# Patient Record
Sex: Female | Born: 1945
Health system: Southern US, Community
[De-identification: ages and names within clinical notes are randomized; demographics above are authoritative.]

## PROBLEM LIST (undated history)

## (undated) DIAGNOSIS — M1990 Unspecified osteoarthritis, unspecified site: Secondary | ICD-10-CM

## (undated) DIAGNOSIS — L409 Psoriasis, unspecified: Secondary | ICD-10-CM

## (undated) DIAGNOSIS — M25571 Pain in right ankle and joints of right foot: Secondary | ICD-10-CM

## (undated) DIAGNOSIS — I7300 Raynaud's syndrome without gangrene: Secondary | ICD-10-CM

## (undated) DIAGNOSIS — R7303 Prediabetes: Secondary | ICD-10-CM

## (undated) DIAGNOSIS — E039 Hypothyroidism, unspecified: Secondary | ICD-10-CM

## (undated) DIAGNOSIS — I10 Essential (primary) hypertension: Secondary | ICD-10-CM

## (undated) DIAGNOSIS — L400 Psoriasis vulgaris: Secondary | ICD-10-CM

## (undated) DIAGNOSIS — G8929 Other chronic pain: Secondary | ICD-10-CM

## (undated) HISTORY — PX: BREAST BIOPSY: SHX20

## (undated) HISTORY — DX: Pain in right ankle and joints of right foot: M25.571

## (undated) HISTORY — DX: Hypothyroidism, unspecified: E03.9

## (undated) HISTORY — PX: OTHER SURGICAL HISTORY: SHX169

## (undated) HISTORY — DX: Other chronic pain: G89.29

---

## 2009-09-03 ENCOUNTER — Encounter: Payer: Self-pay | Admitting: Cardiovascular Disease

## 2009-09-04 ENCOUNTER — Ambulatory Visit: Payer: Self-pay | Admitting: Cardiovascular Disease

## 2009-09-04 DIAGNOSIS — I491 Atrial premature depolarization: Secondary | ICD-10-CM | POA: Insufficient documentation

## 2009-09-04 DIAGNOSIS — I498 Other specified cardiac arrhythmias: Secondary | ICD-10-CM | POA: Insufficient documentation

## 2009-11-27 ENCOUNTER — Ambulatory Visit: Payer: Self-pay | Admitting: Internal Medicine

## 2010-03-20 ENCOUNTER — Ambulatory Visit: Payer: Self-pay | Admitting: Internal Medicine

## 2010-04-21 NOTE — Assessment & Plan Note (Signed)
Summary: NP6/AMD   Visit Type:  Initial Consult Primary Provider:  Dr Ronna Polio  CC:  slightly dizzy -- arrythmia.  History of Present Illness: Alexandra Bishop is a pleasant 65 year old woman with no significant cardiac history, who is retired, patient of Dr. Ronna Polio, who presents by referral for evaluation of arrhythmia and bradycardia.  Alexandra Bishop states that overall she feels well. She tries to walk 1 mile per day early in the morning before takeoff. She has no chest pain, no shortness of breath or lower extremity edema either at rest or with exertion. She would like to walk more but she likes to read books and watch TV. She is to watch her diet in an effort to loose weight. She does report significant stressors in that they're having difficulty selling their [place]. She is otherwise happy and has no complaints.  She's not appreciate any tachypalpitations. She does not typically have any dizziness or lightheadedness the reports having a very brief episode this morning though this is not common.  EKG shows normal sinus rhythm with frequent APCs, rate of 83 beats per minute some mild T-wave abnormalities noted in the anterolateral leads, V3 to V6,  as well as lead II  Preventive Screening-Counseling & Management  Alcohol-Tobacco     Smoking Status: never  Caffeine-Diet-Exercise     Does Patient Exercise: yes      Drug Use:  no.    Current Medications (verified): 1)  Levothyroxine Sodium 50 Mcg Tabs (Levothyroxine Sodium) .... Once Daily 2)  Doxycycline Hyclate 50 Mg Caps (Doxycycline Hyclate) .... Once Daily 3)  Naprosyn 500 Mg Tabs (Naproxen) .... Once Daily 4)  Metaxalone 800 Mg Tabs (Metaxalone) .... At Bedtime 5)  Aspirin 81 Mg Tbec (Aspirin) .... Take One Tablet By Mouth Daily 6)  Vitamin E 600 Unit  Caps (Vitamin E) .... 4 Times A Week 7)  Vitamin D 400 Unit  Tabs (Cholecalciferol) .... 2 Daily 8)  No Flush Niacin 250 Mg Tabs  (Inositol Niacinate) .... Once Daily 9)  Multivitamins   Tabs (Multiple Vitamin) .... Once Daily 10)  Zolpidem Tartrate 5 Mg Tabs (Zolpidem Tartrate) .... At Bedtime  Allergies (verified): 1)  ! * Shellfish  Past History:  Past Surgical History: Broke ankle -- RT  Family History: Family History of Cancer:  Family History of Thyroid Disease:   Social History: Retired  -- office work Married  Tobacco Use - No.  Alcohol Use - no Regular Exercise - yes -- walk a mile most days Drug Use - no Smoking Status:  never Does Patient Exercise:  yes Drug Use:  no  Review of Systems  The patient denies fever, weight loss, weight gain, vision loss, decreased hearing, hoarseness, chest pain, syncope, dyspnea on exertion, peripheral edema, prolonged cough, abdominal pain, incontinence, muscle weakness, depression, and enlarged lymph nodes.    Vital Signs:  Patient profile:   65 year old female Height:      67 inches Weight:      210 pounds BMI:     33.01 Pulse rate:   83 / minute BP sitting:   112 / 74  (right arm) Cuff size:   large  Vitals Entered By: Hardin Negus, RMA (September 04, 2009 12:16 PM)  Physical Exam  General:  Well developed, well nourished, in no acute distress. Head:  normocephalic and atraumatic Neck:  Neck supple, no JVD. No masses, thyromegaly or abnormal cervical nodes. Lungs:  Clear  bilaterally to auscultation and percussion. Heart:  Non-displaced PMI, chest non-tender; regular rate and rhythm, S1, S2 without murmurs, rubs or gallops. Carotid upstroke normal, no bruit. . Pedals normal pulses. No edema, no varicosities. Abdomen:  Bowel sounds positive; abdomen soft and non-tender without masses Msk:  Back normal, normal gait. Muscle strength and tone normal. Pulses:  pulses normal in all 4 extremities Extremities:  No clubbing or cyanosis. Neurologic:  Alert and oriented x 3. Skin:  Intact without lesions or rashes. Psych:  Normal affect.   Impression &  Recommendations:  Problem # 1:  SUPRAVENTRICULAR PREMATURE BEATS (ICD-427.61)  EKG shows normal sinus rhythm with frequent APCs. She is asymptomatic and does not appreciate any ectopy. Her clinical exam is benign, she has no symptoms with exertion. No symptoms of lightheadedness or dizziness.  We have suggested to her that we could watch her for now. If she becomes symptomatic with lightheadedness dizziness, or tachypalpitations, we could perform an echocardiogram and possibly even a Holter monitor to estimate the frequency of her ectopy. I suggested that she hold off on stimulants such as coffee and chocolate if she does become symptomatic.  If she is asymptomatic, no further workup is needed at this time. She does not have a strong family history of coronary artery disease in her family. She reports that her cholesterol is relatively well controlled without medications. She is nonsmoker, nondiabetic and has few risk factors for coronary disease.  Her updated medication list for this problem includes:    Aspirin 81 Mg Tbec (Aspirin) .Marland Kitchen... Take one tablet by mouth daily  Her updated medication list for this problem includes:    Aspirin 81 Mg Tbec (Aspirin) .Marland Kitchen... Take one tablet by mouth daily  Problem # 2:  BRADYCARDIA (ICD-427.89) no bradycardia on her examination today, heart rate is adequate. She is not on any medications that would contribute to bradycardia. I suspect he bradycardia may in fact be due to her underlying ectopy. She does have compensatory pauses after each ectopic beat.  Again we will continue to monitor her and if she has worsening symptoms of lightheadedness or dizziness, we would perform a Holter monitor.  Her updated medication list for this problem includes:    Aspirin 81 Mg Tbec (Aspirin) .Marland Kitchen... Take one tablet by mouth daily

## 2010-04-21 NOTE — Letter (Signed)
Summary: PHI  PHI   Imported By: Harlon Flor 09/05/2009 09:03:15  _____________________________________________________________________  External Attachment:    Type:   Image     Comment:   External Document

## 2010-08-18 ENCOUNTER — Ambulatory Visit: Payer: Self-pay | Admitting: Internal Medicine

## 2010-10-07 ENCOUNTER — Encounter: Payer: Self-pay | Admitting: Cardiovascular Disease

## 2010-10-29 ENCOUNTER — Encounter: Payer: Self-pay | Admitting: Internal Medicine

## 2010-11-02 ENCOUNTER — Encounter: Payer: Self-pay | Admitting: Internal Medicine

## 2010-11-12 ENCOUNTER — Other Ambulatory Visit: Payer: Self-pay | Admitting: *Deleted

## 2010-11-12 MED ORDER — MELOXICAM 7.5 MG PO TABS: 7.5000 mg | ORAL_TABLET | Freq: Every day | ORAL | Status: DC

## 2010-11-13 NOTE — Telephone Encounter (Signed)
Rx phoned to pharmacy.  

## 2010-11-17 ENCOUNTER — Ambulatory Visit: Payer: Self-pay | Admitting: Internal Medicine

## 2010-12-14 ENCOUNTER — Other Ambulatory Visit: Payer: Self-pay | Admitting: Internal Medicine

## 2010-12-14 MED ORDER — AMLODIPINE BESYLATE 5 MG PO TABS: 5.0000 mg | ORAL_TABLET | Freq: Every day | ORAL | Status: DC

## 2010-12-25 ENCOUNTER — Ambulatory Visit: Payer: Self-pay | Admitting: Internal Medicine

## 2011-01-04 ENCOUNTER — Ambulatory Visit (INDEPENDENT_AMBULATORY_CARE_PROVIDER_SITE_OTHER): Payer: Medicare Other | Admitting: Internal Medicine

## 2011-01-04 ENCOUNTER — Encounter: Payer: Self-pay | Admitting: Internal Medicine

## 2011-01-04 VITALS — BP 137/76 | HR 54 | Temp 98.2°F | Resp 16 | Ht 65.5 in | Wt 205.2 lb

## 2011-01-04 DIAGNOSIS — F411 Generalized anxiety disorder: Secondary | ICD-10-CM

## 2011-01-04 DIAGNOSIS — F419 Anxiety disorder, unspecified: Secondary | ICD-10-CM

## 2011-01-04 DIAGNOSIS — Z23 Encounter for immunization: Secondary | ICD-10-CM

## 2011-01-04 DIAGNOSIS — I10 Essential (primary) hypertension: Secondary | ICD-10-CM | POA: Insufficient documentation

## 2011-01-04 DIAGNOSIS — E559 Vitamin D deficiency, unspecified: Secondary | ICD-10-CM | POA: Insufficient documentation

## 2011-01-04 DIAGNOSIS — E785 Hyperlipidemia, unspecified: Secondary | ICD-10-CM

## 2011-01-04 DIAGNOSIS — Z0000 Encounter for general adult medical examination without abnormal findings: Secondary | ICD-10-CM

## 2011-01-04 DIAGNOSIS — E039 Hypothyroidism, unspecified: Secondary | ICD-10-CM

## 2011-01-04 NOTE — Progress Notes (Signed)
Subjective:    Patient ID: Alexandra Bishop, female    DOB: 03/30/1945, 65 y.o.   MRN: [id number]  HPI Alexandra Bishop is a 65 year old female who presents to followup hypertension, hypothyroidism. She reports recent increase in anxiety secondary [place] in moving closer to town but there is some difference of opinion between her and her husband. She notes feeling some anxiety and sadness about this. She is interested in learning techniques to help lower her anxiety. She is not interested in medication.  In regards to her hypothyroidism and hypertension she reports full compliance with her medications. She denies any complications with her medications.    Outpatient Encounter Prescriptions as of 01/04/2011  Medication Sig Dispense Refill  . amLODipine (NORVASC) 5 MG tablet Take 1 tablet (5 mg total) by mouth daily.  30 tablet  3  . Ascorbic Acid (VITAMIN C) 1000 MG tablet Take 1,000 mg by mouth daily.        Marland Kitchen aspirin 81 MG EC tablet Take 81 mg by mouth daily.        Marland Kitchen b complex vitamins tablet Take 1 tablet by mouth daily.        Marland Kitchen doxycycline (VIBRAMYCIN) 50 MG capsule Take 50 mg by mouth daily.        Marland Kitchen levothyroxine (SYNTHROID, LEVOTHROID) 50 MCG tablet Take 100 mcg by mouth daily.       . meloxicam (MOBIC) 7.5 MG tablet Take 1 tablet (7.5 mg total) by mouth daily.  30 tablet  3  . Multiple Vitamin (MULTIVITAMIN) tablet Take 1 tablet by mouth daily.        . vitamin D, CHOLECALCIFEROL, 400 UNITS tablet Take 1,000 Units by mouth daily.       . vitamin E 600 UNIT capsule Take 400 Units by mouth daily. 4 times a week      . zolpidem (AMBIEN) 5 MG tablet Take 5 mg by mouth at bedtime.          Review of Systems  Constitutional: Negative for fever, chills, appetite change, fatigue and unexpected weight change.  HENT: Negative for ear pain, congestion, sore throat, trouble swallowing, neck pain, voice change and sinus pressure.     Eyes: Negative for visual disturbance.  Respiratory: Negative for cough, shortness of breath, wheezing and stridor.   Cardiovascular: Negative for chest pain, palpitations and leg swelling.  Gastrointestinal: Negative for nausea, vomiting, abdominal pain, diarrhea, constipation, blood in stool, abdominal distention and anal bleeding.  Genitourinary: Negative for dysuria and flank pain.  Musculoskeletal: Positive for back pain. Negative for myalgias, arthralgias and gait problem.  Skin: Negative for color change and rash.  Neurological: Negative for dizziness and headaches.  Hematological: Negative for adenopathy. Does not bruise/bleed easily.  Psychiatric/Behavioral: Positive for dysphoric mood. Negative for suicidal ideas and sleep disturbance. The patient is nervous/anxious.    BP 137/76  Pulse 54  Temp(Src) 98.2 F (36.8 C) (Oral)  Resp 16  Ht 5' 5.5" (1.664 m)  Wt 205 lb 4 oz (93.101 kg)  BMI 33.64 kg/m2  SpO2 97%     Objective:   Physical Exam  Constitutional: She is oriented to person, place, and time. She appears well-developed and well-nourished. No distress.  HENT:  Head: Normocephalic and atraumatic.  Right Ear: External ear normal.  Left Ear: External ear normal.  Nose: Nose normal.  Mouth/Throat: Oropharynx is clear and moist. No oropharyngeal exudate.  Eyes: Conjunctivae are normal. Pupils  are equal, round, and reactive to light. Right eye exhibits no discharge. Left eye exhibits no discharge. No scleral icterus.  Neck: Normal range of motion. Neck supple. No tracheal deviation present. No thyromegaly present.  Cardiovascular: Normal rate, regular rhythm, normal heart sounds and intact distal pulses.  Exam reveals no gallop and no friction rub.   No murmur heard. Pulmonary/Chest: Effort normal and breath sounds normal. No respiratory distress. She has no wheezes. She has no rales. She exhibits no tenderness. Right breast exhibits no inverted nipple, no mass, no  nipple discharge, no skin change and no tenderness. Left breast exhibits no inverted nipple, no mass, no nipple discharge, no skin change and no tenderness. Breasts are symmetrical.  Abdominal: Soft. Bowel sounds are normal. She exhibits no distension and no mass. There is no tenderness. There is no rebound and no guarding.  Musculoskeletal: Normal range of motion. She exhibits no edema and no tenderness.  Lymphadenopathy:    She has no cervical adenopathy.  Neurological: She is alert and oriented to person, place, and time. No cranial nerve deficit. She exhibits normal muscle tone. Coordination normal.  Skin: Skin is warm and dry. No rash noted. She is not diaphoretic. No erythema. No pallor.  Psychiatric: Her behavior is normal. Judgment and thought content normal. Her mood appears anxious.          Assessment & Plan:  1. Anxiety -worsened in the setting of pending move. Offered support today. Gave resources including the book Feeling Good by Lawerance Bach and a relaxation tape by Tad Moore. She will call or return to clinic should symptoms not improve.  2. Hypertension -blood pressure well-controlled today. We will check renal function with labs fasting tomorrow. She will followup in 6 months.   3. Hypothyroidism - Will check TSH with labs tomorrow. She will continue Synthroid.

## 2011-01-05 ENCOUNTER — Other Ambulatory Visit (INDEPENDENT_AMBULATORY_CARE_PROVIDER_SITE_OTHER): Payer: Medicare Other | Admitting: *Deleted

## 2011-01-05 DIAGNOSIS — E785 Hyperlipidemia, unspecified: Secondary | ICD-10-CM

## 2011-01-05 DIAGNOSIS — I10 Essential (primary) hypertension: Secondary | ICD-10-CM

## 2011-01-05 DIAGNOSIS — E559 Vitamin D deficiency, unspecified: Secondary | ICD-10-CM

## 2011-01-05 DIAGNOSIS — E039 Hypothyroidism, unspecified: Secondary | ICD-10-CM

## 2011-01-05 DIAGNOSIS — Z0000 Encounter for general adult medical examination without abnormal findings: Secondary | ICD-10-CM

## 2011-01-05 LAB — COMPREHENSIVE METABOLIC PANEL
ALT: 29 U/L (ref 0–35)
AST: 29 U/L (ref 0–37)
Albumin: 4.4 g/dL (ref 3.5–5.2)
Alkaline Phosphatase: 79 U/L (ref 39–117)
BUN: 17 mg/dL (ref 6–23)
CO2: 28 mEq/L (ref 19–32)
Calcium: 9.2 mg/dL (ref 8.4–10.5)
Chloride: 108 mEq/L (ref 96–112)
Creatinine, Ser: 0.7 mg/dL (ref 0.4–1.2)
GFR: 91.62 mL/min (ref >60.00)
Glucose, Bld: 95 mg/dL (ref 70–99)
Potassium: 4.4 mEq/L (ref 3.5–5.1)
Sodium: 142 mEq/L (ref 135–145)
Total Bilirubin: 1 mg/dL (ref 0.3–1.2)
Total Protein: 6.5 g/dL (ref 6.0–8.3)

## 2011-01-05 LAB — CBC WITH DIFFERENTIAL/PLATELET
Basophils Absolute: 0 10*3/uL (ref 0.0–0.1)
Basophils Relative: 0.6 % (ref 0.0–3.0)
Eosinophils Absolute: 0.2 10*3/uL (ref 0.0–0.7)
Eosinophils Relative: 3.1 % (ref 0.0–5.0)
HCT: 46 % (ref 36.0–46.0)
Hemoglobin: 15.3 g/dL — ABNORMAL HIGH (ref 12.0–15.0)
Lymphocytes Relative: 20.6 % (ref 12.0–46.0)
Lymphs Abs: 1.2 10*3/uL (ref 0.7–4.0)
MCHC: 33.3 g/dL (ref 30.0–36.0)
MCV: 86.3 fl (ref 78.0–100.0)
Monocytes Absolute: 0.5 10*3/uL (ref 0.1–1.0)
Monocytes Relative: 8.4 % (ref 3.0–12.0)
Neutro Abs: 3.8 10*3/uL (ref 1.4–7.7)
Neutrophils Relative %: 67.3 % (ref 43.0–77.0)
Platelets: 227 10*3/uL (ref 150.0–400.0)
RBC: 5.33 Mil/uL — ABNORMAL HIGH (ref 3.87–5.11)
RDW: 12.9 % (ref 11.5–14.6)
WBC: 5.7 10*3/uL (ref 4.5–10.5)

## 2011-01-05 LAB — LIPID PANEL
Cholesterol: 174 mg/dL (ref 0–200)
HDL: 49.1 mg/dL (ref >39.00)
LDL Cholesterol: 97 mg/dL (ref 0–99)
Total CHOL/HDL Ratio: 4
Triglycerides: 140 mg/dL (ref 0.0–149.0)
VLDL: 28 mg/dL (ref 0.0–40.0)

## 2011-01-05 LAB — TSH: TSH: 0.71 u[IU]/mL (ref 0.35–5.50)

## 2011-01-06 LAB — VITAMIN D 25 HYDROXY (VIT D DEFICIENCY, FRACTURES): Vit D, 25-Hydroxy: 56 ng/mL (ref 30–89)

## 2011-01-11 ENCOUNTER — Telehealth: Payer: Self-pay | Admitting: Internal Medicine

## 2011-01-11 NOTE — Telephone Encounter (Signed)
Need you to call dr Markham Jordan @ [facility]  To make her referral she cannot make this herself

## 2011-01-13 ENCOUNTER — Encounter: Payer: Self-pay | Admitting: Internal Medicine

## 2011-01-22 ENCOUNTER — Other Ambulatory Visit: Payer: Self-pay | Admitting: Internal Medicine

## 2011-01-22 MED ORDER — ZOLPIDEM TARTRATE 5 MG PO TABS: 5.0000 mg | ORAL_TABLET | Freq: Every day | ORAL | Status: DC

## 2011-01-23 NOTE — Telephone Encounter (Signed)
Faxed to pharm 

## 2011-01-24 LAB — HM COLONOSCOPY: HM Colonoscopy: NORMAL

## 2011-03-12 ENCOUNTER — Other Ambulatory Visit: Payer: Self-pay | Admitting: *Deleted

## 2011-03-12 MED ORDER — MELOXICAM 7.5 MG PO TABS: 7.5000 mg | ORAL_TABLET | Freq: Every day | ORAL | Status: DC

## 2011-04-14 ENCOUNTER — Other Ambulatory Visit: Payer: Self-pay | Admitting: *Deleted

## 2011-04-14 ENCOUNTER — Ambulatory Visit (INDEPENDENT_AMBULATORY_CARE_PROVIDER_SITE_OTHER): Payer: Medicare Other | Admitting: Internal Medicine

## 2011-04-14 ENCOUNTER — Encounter: Payer: Self-pay | Admitting: Internal Medicine

## 2011-04-14 VITALS — BP 142/90 | HR 63 | Temp 97.8°F | Ht 67.0 in | Wt 208.0 lb

## 2011-04-14 DIAGNOSIS — R82998 Other abnormal findings in urine: Secondary | ICD-10-CM

## 2011-04-14 DIAGNOSIS — M79609 Pain in unspecified limb: Secondary | ICD-10-CM

## 2011-04-14 DIAGNOSIS — R8290 Unspecified abnormal findings in urine: Secondary | ICD-10-CM | POA: Insufficient documentation

## 2011-04-14 DIAGNOSIS — I10 Essential (primary) hypertension: Secondary | ICD-10-CM

## 2011-04-14 DIAGNOSIS — E669 Obesity, unspecified: Secondary | ICD-10-CM | POA: Insufficient documentation

## 2011-04-14 DIAGNOSIS — M79601 Pain in right arm: Secondary | ICD-10-CM | POA: Insufficient documentation

## 2011-04-14 LAB — POCT URINALYSIS DIPSTICK
Bilirubin, UA: NEGATIVE
Blood, UA: NEGATIVE
Glucose, UA: NEGATIVE
Ketones, UA: NEGATIVE
Leukocytes, UA: NEGATIVE
Nitrite, UA: NEGATIVE
Protein, UA: NEGATIVE
Spec Grav, UA: 1.025
Urobilinogen, UA: 0.2
pH, UA: 5.5

## 2011-04-14 MED ORDER — AMLODIPINE BESYLATE 5 MG PO TABS: 5.0000 mg | ORAL_TABLET | Freq: Every day | ORAL | Status: DC

## 2011-04-14 NOTE — Assessment & Plan Note (Signed)
Blood pressure is elevated today. Patient would like to try dietary means to help improve blood pressure control. Gave her information on the DASH diet. Patient will followup in one month.

## 2011-04-14 NOTE — Assessment & Plan Note (Signed)
Patient is obese with BMI of 32. She has adopted a lower calorie diet and I encouraged her to continue with this. However, she has limited intake of some healthy foods such as spinach. Encouraged her to increase intake of fruits and vegetables. We will set her up with a nutritionist. Encouraged moderate exercise with a goal of 30-60 minutes most days of the week. She will followup in one month.

## 2011-04-14 NOTE — Assessment & Plan Note (Signed)
Suspect secondary to overuse. Exam is normal. Patient will call or return to clinic if symptoms persist.

## 2011-04-14 NOTE — Patient Instructions (Signed)
DASH diet - Diet to lower blood pressure

## 2011-04-14 NOTE — Assessment & Plan Note (Signed)
Suspect secondary to use of multivitamin including vitamin C. Symptoms resolved with discontinuing this medication. Urinalysis is normal today and patient does not have symptoms or physical exam findings to suggest urinary tract infection. Will, however send urine for culture. Patient will followup as needed.

## 2011-04-14 NOTE — Progress Notes (Signed)
Subjective:    Patient ID: Alexandra Bishop, female    DOB: February 07, 1946, 66 y.o.   MRN: [id number]  HPI 66 year old female with history of hypertension, hypothyroidism, and obesity presents for acute visit complaining of foul smell to her urine. She first noticed this several weeks ago. This is in the setting of using the vitamin supplements including vitamin C. She stopped taking the medication and the foul-smelling improved. She denies any dysuria, frequency, hematuria, flank pain, fever or chills.  She is also concerned today about recent elevated blood pressure. She notes that when she was evaluated for her colonoscopy her blood pressure was elevated to 180/100. She reports full compliance with her amlodipine. She is interested in making changes in diet and exercise to help lower blood pressure. Next  She is also concerned about her weight. She notes that she has adopted a diet which is low in calories with a goal of 300 calories per meal. She has been limiting intake of foods that are high in fiber because of her recommendation she read in a magazine that foods high in fiber can exacerbate thyroid dysfunction. She does not currently participate in an exercise program.  She is also concerned today about right arm pain. She notes that she has been very active doing gardening crafts and questions if this may be a strain in her muscle. The pain is located in her right upper arm. She has no pain in her joint. She has no weakness in her arm. She has no numbness in her arm or hands.  Outpatient Encounter Prescriptions as of 04/14/2011  Medication Sig Dispense Refill  . amLODipine (NORVASC) 5 MG tablet Take 1 tablet (5 mg total) by mouth daily.  30 tablet  3  . aspirin 81 MG EC tablet Take 81 mg by mouth daily.        Marland Kitchen b complex vitamins tablet Take 1 tablet by mouth daily.        Marland Kitchen doxycycline (VIBRAMYCIN) 50 MG capsule Take 50 mg by mouth daily.        Marland Kitchen levothyroxine (SYNTHROID, LEVOTHROID) 50 MCG  tablet Take 100 mcg by mouth daily.       . meloxicam (MOBIC) 7.5 MG tablet Take 1 tablet (7.5 mg total) by mouth daily.  90 tablet  1  . Multiple Vitamin (MULTIVITAMIN) tablet Take 1 tablet by mouth daily.        . vitamin D, CHOLECALCIFEROL, 400 UNITS tablet Take 1,000 Units by mouth daily.       . vitamin E 600 UNIT capsule Take 400 Units by mouth daily. 4 times a week      . zolpidem (AMBIEN) 5 MG tablet Take 1 tablet (5 mg total) by mouth at bedtime.  30 tablet  3    Review of Systems  Constitutional: Negative for fever, chills, appetite change, fatigue and unexpected weight change.  HENT: Negative for ear pain, congestion, sore throat, trouble swallowing, neck pain, voice change and sinus pressure.   Eyes: Negative for visual disturbance.  Respiratory: Negative for cough, shortness of breath, wheezing and stridor.   Cardiovascular: Negative for chest pain, palpitations and leg swelling.  Gastrointestinal: Negative for nausea, vomiting, abdominal pain, diarrhea, constipation, blood in stool, abdominal distention and anal bleeding.  Genitourinary: Negative for dysuria, urgency, frequency, hematuria, flank pain, decreased urine volume, difficulty urinating and pelvic pain.  Musculoskeletal: Positive for myalgias. Negative for arthralgias and gait problem.  Skin: Negative for color change and rash.  Neurological: Negative for  dizziness and headaches.  Hematological: Negative for adenopathy. Does not bruise/bleed easily.  Psychiatric/Behavioral: Negative for suicidal ideas, sleep disturbance and dysphoric mood. The patient is not nervous/anxious.    BP 142/90  Pulse 63  Temp(Src) 97.8 F (36.6 C) (Oral)  Ht 5\' 7"  (1.702 m)  Wt 208 lb (94.348 kg)  BMI 32.58 kg/m2  SpO2 98%     Objective:   Physical Exam  Constitutional: She is oriented to person, place, and time. She appears well-developed and well-nourished. No distress.  HENT:  Head: Normocephalic and atraumatic.  Right Ear:  External ear normal.  Left Ear: External ear normal.  Nose: Nose normal.  Mouth/Throat: Oropharynx is clear and moist. No oropharyngeal exudate.  Eyes: Conjunctivae are normal. Pupils are equal, round, and reactive to light. Right eye exhibits no discharge. Left eye exhibits no discharge. No scleral icterus.  Neck: Normal range of motion. Neck supple. No tracheal deviation present. No thyromegaly present.  Cardiovascular: Normal rate, regular rhythm, normal heart sounds and intact distal pulses.  Exam reveals no gallop and no friction rub.   No murmur heard. Pulmonary/Chest: Effort normal and breath sounds normal. No respiratory distress. She has no wheezes. She has no rales. She exhibits no tenderness.  Musculoskeletal: Normal range of motion. She exhibits no edema and no tenderness.  Lymphadenopathy:    She has no cervical adenopathy.  Neurological: She is alert and oriented to person, place, and time. No cranial nerve deficit. She exhibits normal muscle tone. Coordination normal.  Skin: Skin is warm and dry. No rash noted. She is not diaphoretic. No erythema. No pallor.  Psychiatric: She has a normal mood and affect. Her behavior is normal. Judgment and thought content normal.          Assessment & Plan:

## 2011-04-17 LAB — URINE CULTURE
Colony Count: NO GROWTH
Organism ID, Bacteria: NO GROWTH

## 2011-04-21 ENCOUNTER — Telehealth: Payer: Self-pay | Admitting: Internal Medicine

## 2011-04-21 NOTE — Telephone Encounter (Signed)
Patient wants her lab results.

## 2011-04-21 NOTE — Telephone Encounter (Signed)
Only labs I see are from 1/23, all are normal correct?

## 2011-04-21 NOTE — Telephone Encounter (Signed)
All I see is a urine culture which was negative from 1/23.

## 2011-04-22 NOTE — Telephone Encounter (Signed)
Patient returned call and was notified.

## 2011-04-22 NOTE — Telephone Encounter (Signed)
Pt returned your call .  Patient stated you can leave message

## 2011-04-22 NOTE — Telephone Encounter (Signed)
Left message asking patient to return my call.

## 2011-04-26 LAB — HM COLONOSCOPY: HM Colonoscopy: NORMAL

## 2011-04-30 ENCOUNTER — Telehealth: Payer: Self-pay | Admitting: Internal Medicine

## 2011-04-30 NOTE — Telephone Encounter (Signed)
NURSE PRACTICIONNER at GI office sent note over that pt BP elevated. Recommended STOPPING meloxicam.  Please tell pt to stop meloxicam.  She will need to follow up in 1 week at our office because of elevated BP.

## 2011-05-03 NOTE — Telephone Encounter (Signed)
I spoke w/pt - she states that she felt the automatic BP cuff was malfunctioning. They checked her BP manually in forearm at end of visit and pt says it was normal. I advised her to hold meloxicam and keep f/u apt this Thursday.

## 2011-05-06 ENCOUNTER — Telehealth: Payer: Self-pay | Admitting: *Deleted

## 2011-05-06 ENCOUNTER — Ambulatory Visit: Payer: Medicare Other | Admitting: Internal Medicine

## 2011-05-06 NOTE — Telephone Encounter (Signed)
Pt had car trouble this am and had to cx apt. She was supposed to f/u on her BP (see previous phone message). She has colon scheduled for Tuesday and she set up apt w/us for Wed. I discussed w/pt and rescheduled her for Monday AM.

## 2011-05-10 ENCOUNTER — Encounter: Payer: Self-pay | Admitting: Internal Medicine

## 2011-05-10 ENCOUNTER — Ambulatory Visit (INDEPENDENT_AMBULATORY_CARE_PROVIDER_SITE_OTHER): Payer: Medicare Other | Admitting: Internal Medicine

## 2011-05-10 ENCOUNTER — Other Ambulatory Visit: Payer: Self-pay | Admitting: *Deleted

## 2011-05-10 VITALS — BP 142/82 | HR 64 | Temp 98.4°F | Ht 67.0 in | Wt 205.0 lb

## 2011-05-10 DIAGNOSIS — M2550 Pain in unspecified joint: Secondary | ICD-10-CM

## 2011-05-10 DIAGNOSIS — I10 Essential (primary) hypertension: Secondary | ICD-10-CM

## 2011-05-10 DIAGNOSIS — M79601 Pain in right arm: Secondary | ICD-10-CM

## 2011-05-10 DIAGNOSIS — M79609 Pain in unspecified limb: Secondary | ICD-10-CM

## 2011-05-10 MED ORDER — DOXYCYCLINE HYCLATE 50 MG PO CAPS: 50.0000 mg | ORAL_CAPSULE | Freq: Every day | ORAL | Status: DC

## 2011-05-10 MED ORDER — AMLODIPINE BESYLATE 10 MG PO TABS: 10.0000 mg | ORAL_TABLET | Freq: Every day | ORAL | Status: DC

## 2011-05-10 MED ORDER — TRAMADOL HCL 50 MG PO TABS: 50.0000 mg | ORAL_TABLET | Freq: Three times a day (TID) | ORAL | Status: AC | PRN

## 2011-05-10 NOTE — Patient Instructions (Signed)
Increase Amlodipine to 10mg  daily.  Try using Tramadol 50mg  every 8hr as needed for pain.  Follow up 1 month.

## 2011-05-10 NOTE — Assessment & Plan Note (Signed)
Blood pressure is slightly elevated. Will increase dose of amlodipine to 10 mg daily. Patient will followup in one month.

## 2011-05-10 NOTE — Assessment & Plan Note (Signed)
Symptoms most consistent with biceps tendinitis. Patient reports minimal improvement with meloxicam. We'll add tramadol to see if any improvement. If no improvement, would favor getting imaging of her right shoulder and upper arm, in setting up referral to orthopedics.

## 2011-05-10 NOTE — Progress Notes (Signed)
Subjective:    Patient ID: Alexandra Bishop, female    DOB: 10/09/1945, 66 y.o.   MRN: [id number]  HPI 66 year old female with history of hypertension, and recent episodes of right arm pain presents for followup. Based on notes from her other physicians, her blood pressure has been elevated. She was started on amlodipine 5 mg daily with some improvement. She has not been regularly checking her blood pressure at home. She denies any headache, palpitations, chest pain, or shortness of breath. She reports compliance with her amlodipine.  In regards to her right arm pain, she notes no improvement with the use of meloxicam. She continues to have right arm pain anteriorly just below her shoulder. She notes that this is worsened by some of her activities such as crocheting. She does not notice weakness in her right arm, swelling, numbness or other complaints.  Outpatient Encounter Prescriptions as of 05/10/2011  Medication Sig Dispense Refill  . amLODipine (NORVASC) 10 MG tablet Take 1 tablet (10 mg total) by mouth daily.  90 tablet  3  . aspirin 81 MG EC tablet Take 81 mg by mouth daily.        Marland Kitchen levothyroxine (SYNTHROID, LEVOTHROID) 50 MCG tablet Take 100 mcg by mouth daily.       Marland Kitchen zolpidem (AMBIEN) 5 MG tablet Take 1 tablet (5 mg total) by mouth at bedtime.  30 tablet  3  . DISCONTD: amLODipine (NORVASC) 5 MG tablet Take 1 tablet (5 mg total) by mouth daily.  90 tablet  0  . b complex vitamins tablet Take 1 tablet by mouth daily.        . Multiple Vitamin (MULTIVITAMIN) tablet Take 1 tablet by mouth daily.        . traMADol (ULTRAM) 50 MG tablet Take 1 tablet (50 mg total) by mouth every 8 (eight) hours as needed for pain.  90 tablet  3  . vitamin D, CHOLECALCIFEROL, 400 UNITS tablet Take 1,000 Units by mouth daily.       . vitamin E 600 UNIT capsule Take 400 Units by mouth daily. 4 times a week      . DISCONTD: doxycycline (VIBRAMYCIN) 50 MG capsule Take 50 mg by mouth daily.        Marland Kitchen DISCONTD:  meloxicam (MOBIC) 7.5 MG tablet Take 1 tablet (7.5 mg total) by mouth daily.  90 tablet  1    Review of Systems  Constitutional: Negative for fever, chills, appetite change, fatigue and unexpected weight change.  HENT: Negative for ear pain, congestion, sore throat, trouble swallowing, neck pain, voice change and sinus pressure.   Eyes: Negative for visual disturbance.  Respiratory: Negative for cough, shortness of breath, wheezing and stridor.   Cardiovascular: Negative for chest pain, palpitations and leg swelling.  Gastrointestinal: Negative for nausea, vomiting, abdominal pain, diarrhea, constipation, blood in stool, abdominal distention and anal bleeding.  Genitourinary: Negative for dysuria and flank pain.  Musculoskeletal: Positive for myalgias and arthralgias. Negative for gait problem.  Skin: Negative for color change and rash.  Neurological: Negative for dizziness and headaches.  Hematological: Negative for adenopathy. Does not bruise/bleed easily.  Psychiatric/Behavioral: Negative for suicidal ideas, sleep disturbance and dysphoric mood. The patient is not nervous/anxious.    BP 142/82  Pulse 64  Temp(Src) 98.4 F (36.9 C) (Oral)  Ht 5\' 7"  (1.702 m)  Wt 205 lb (92.987 kg)  BMI 32.11 kg/m2  SpO2 100%     Objective:   Physical Exam  Constitutional: She is oriented  to person, place, and time. She appears well-developed and well-nourished. No distress.  HENT:  Head: Normocephalic and atraumatic.  Right Ear: External ear normal.  Left Ear: External ear normal.  Nose: Nose normal.  Mouth/Throat: Oropharynx is clear and moist. No oropharyngeal exudate.  Eyes: Conjunctivae are normal. Pupils are equal, round, and reactive to light. Right eye exhibits no discharge. Left eye exhibits no discharge. No scleral icterus.  Neck: Normal range of motion. Neck supple. No tracheal deviation present. No thyromegaly present.  Cardiovascular: Normal rate, regular rhythm, normal heart  sounds and intact distal pulses.  Exam reveals no gallop and no friction rub.   No murmur heard. Pulmonary/Chest: Effort normal and breath sounds normal. No respiratory distress. She has no wheezes. She has no rales. She exhibits no tenderness.  Musculoskeletal: Normal range of motion. She exhibits no edema and no tenderness.       Right shoulder: She exhibits tenderness.       Arms: Lymphadenopathy:    She has no cervical adenopathy.  Neurological: She is alert and oriented to person, place, and time. No cranial nerve deficit. She exhibits normal muscle tone. Coordination normal.  Skin: Skin is warm and dry. No rash noted. She is not diaphoretic. No erythema. No pallor.  Psychiatric: She has a normal mood and affect. Her behavior is normal. Judgment and thought content normal.          Assessment & Plan:

## 2011-05-11 ENCOUNTER — Ambulatory Visit: Payer: Self-pay | Admitting: Unknown Physician Specialty

## 2011-05-12 ENCOUNTER — Telehealth: Payer: Self-pay | Admitting: *Deleted

## 2011-05-12 ENCOUNTER — Ambulatory Visit: Payer: Medicare Other | Admitting: Internal Medicine

## 2011-05-12 LAB — PATHOLOGY REPORT

## 2011-05-12 NOTE — Telephone Encounter (Signed)
Patient requesting a call to discuss her medications.

## 2011-05-13 NOTE — Telephone Encounter (Signed)
Spoke w/pt - allergy list updated.

## 2011-05-18 ENCOUNTER — Ambulatory Visit: Payer: Medicare Other | Admitting: Internal Medicine

## 2011-05-19 ENCOUNTER — Ambulatory Visit: Payer: Self-pay | Admitting: Internal Medicine

## 2011-05-21 ENCOUNTER — Ambulatory Visit: Payer: Self-pay | Admitting: Internal Medicine

## 2011-05-25 ENCOUNTER — Ambulatory Visit: Payer: Medicare Other | Admitting: Internal Medicine

## 2011-05-31 ENCOUNTER — Encounter: Payer: Self-pay | Admitting: Internal Medicine

## 2011-06-04 ENCOUNTER — Ambulatory Visit (INDEPENDENT_AMBULATORY_CARE_PROVIDER_SITE_OTHER): Payer: Medicare Other | Admitting: Internal Medicine

## 2011-06-04 ENCOUNTER — Encounter: Payer: Self-pay | Admitting: Internal Medicine

## 2011-06-04 VITALS — BP 130/72 | HR 87 | Temp 98.0°F | Ht 67.0 in | Wt 197.0 lb

## 2011-06-04 DIAGNOSIS — R230 Cyanosis: Secondary | ICD-10-CM | POA: Insufficient documentation

## 2011-06-04 DIAGNOSIS — M79621 Pain in right upper arm: Secondary | ICD-10-CM

## 2011-06-04 DIAGNOSIS — M79609 Pain in unspecified limb: Secondary | ICD-10-CM

## 2011-06-04 DIAGNOSIS — I10 Essential (primary) hypertension: Secondary | ICD-10-CM

## 2011-06-04 NOTE — Progress Notes (Signed)
Subjective:    Patient ID: Alexandra Bishop, female    DOB: 1945-04-24, 66 y.o.   MRN: [id number]  HPI 66 year old female with history of hypertension, hypothyroidism, and chronic pain in her right upper arm presents for followup. In regards to her hypertension, she reports full compliance with her medications. She denies any headache, chest pain, palpitations.  She is concerned today about persistent pain in her right upper arm. This is described as diffuse aching pain over the right upper anterior arm. It first began after a fall several years ago. She has not had imaging for evaluation. She denies weakness in her arm. She denies swelling of her arm. She had been using meloxicam with reasonable control of her pain, however her blood pressure was elevated on this medication. She is now using hydrocodone as needed twice daily for severe pain.  She is also concerned today about purple discoloration of her toes. She notes this has been present for several years. She has been followed by podiatry. She notes some peeling and ulceration underneath her left middle toe. She has been wearing protective covers for her toes. She denies any pain at the site. She denies any fever or chills.  Outpatient Encounter Prescriptions as of 06/04/2011  Medication Sig Dispense Refill  . amLODipine (NORVASC) 10 MG tablet Take 1 tablet (10 mg total) by mouth daily.  90 tablet  3  . aspirin 81 MG EC tablet Take 81 mg by mouth daily.        Marland Kitchen b complex vitamins tablet Take 1 tablet by mouth daily.        Marland Kitchen doxycycline (VIBRAMYCIN) 50 MG capsule Take 1 capsule (50 mg total) by mouth daily.  90 capsule  1  . levothyroxine (SYNTHROID, LEVOTHROID) 50 MCG tablet Take 100 mcg by mouth daily.       . meloxicam (MOBIC) 15 MG tablet Take 15 mg by mouth daily.      . Multiple Vitamin (MULTIVITAMIN) tablet Take 1 tablet by mouth daily.        . vitamin D, CHOLECALCIFEROL, 400 UNITS tablet Take 800 Units by mouth daily.       Marland Kitchen zolpidem  (AMBIEN) 5 MG tablet Take 1 tablet (5 mg total) by mouth at bedtime.  30 tablet  3  . DISCONTD: vitamin E 600 UNIT capsule Take 400 Units by mouth daily. 4 times a week        Review of Systems  Constitutional: Negative for fever, chills, appetite change, fatigue and unexpected weight change.  HENT: Negative for ear pain, congestion, sore throat, trouble swallowing, neck pain, voice change and sinus pressure.   Eyes: Negative for visual disturbance.  Respiratory: Negative for cough, shortness of breath, wheezing and stridor.   Cardiovascular: Negative for chest pain, palpitations and leg swelling.  Gastrointestinal: Negative for nausea, vomiting, abdominal pain, diarrhea, constipation, blood in stool, abdominal distention and anal bleeding.  Genitourinary: Negative for dysuria and flank pain.  Musculoskeletal: Positive for myalgias and arthralgias. Negative for gait problem.  Skin: Positive for color change and wound. Negative for rash.  Neurological: Negative for dizziness and headaches.  Hematological: Negative for adenopathy. Does not bruise/bleed easily.  Psychiatric/Behavioral: Negative for suicidal ideas, sleep disturbance and dysphoric mood. The patient is not nervous/anxious.    BP 130/72  Pulse 87  Temp(Src) 98 F (36.7 C) (Oral)  Ht 5\' 7"  (1.702 m)  Wt 197 lb (89.359 kg)  BMI 30.85 kg/m2  SpO2 100%     Objective:  Physical Exam  Constitutional: She is oriented to person, place, and time. She appears well-developed and well-nourished. No distress.  HENT:  Head: Normocephalic and atraumatic.  Right Ear: External ear normal.  Left Ear: External ear normal.  Nose: Nose normal.  Mouth/Throat: Oropharynx is clear and moist. No oropharyngeal exudate.  Eyes: Conjunctivae are normal. Pupils are equal, round, and reactive to light. Right eye exhibits no discharge. Left eye exhibits no discharge. No scleral icterus.  Neck: Normal range of motion. Neck supple. No tracheal  deviation present. No thyromegaly present.  Cardiovascular: Normal rate, regular rhythm, normal heart sounds and intact distal pulses.  Exam reveals no gallop and no friction rub.   No murmur heard. Pulmonary/Chest: Effort normal and breath sounds normal. No respiratory distress. She has no wheezes. She has no rales. She exhibits no tenderness.  Musculoskeletal: Normal range of motion. She exhibits no edema and no tenderness.       Right upper arm: She exhibits tenderness. She exhibits no bony tenderness, no swelling, no edema and no deformity.       Feet:  Lymphadenopathy:    She has no cervical adenopathy.  Neurological: She is alert and oriented to person, place, and time. No cranial nerve deficit. She exhibits normal muscle tone. Coordination normal.  Skin: Skin is warm and dry. No rash noted. She is not diaphoretic. No erythema. No pallor.  Psychiatric: She has a normal mood and affect. Her behavior is normal. Judgment and thought content normal.          Assessment & Plan:

## 2011-06-04 NOTE — Assessment & Plan Note (Signed)
Blood pressure better controlled today. We'll continue current medications. Followup in one month.

## 2011-06-04 NOTE — Assessment & Plan Note (Signed)
Persistent for years. Exam is normal. No weakness to suggest rotator cuff tear. Question if she may have scarring after a tear of her biceps in the distant past. Will set up referral to orthopedics to see if any additional imaging or intervention might be helpful. Will continue hydrocodone as needed for severe pain.

## 2011-06-04 NOTE — Assessment & Plan Note (Signed)
Bilateral, however most prominent on the left middle toe. There is some ulceration at this area. She reports this has been chronic. She has been followed by podiatry. Will get arterial ultrasound of both lower extremities for further evaluation. Follow up 1 month.

## 2011-06-08 ENCOUNTER — Other Ambulatory Visit: Payer: Self-pay | Admitting: *Deleted

## 2011-06-09 MED ORDER — ZOLPIDEM TARTRATE 5 MG PO TABS: 5.0000 mg | ORAL_TABLET | Freq: Every day | ORAL | Status: DC

## 2011-06-10 NOTE — Telephone Encounter (Signed)
Ambien refill called to pharmacy as directed by Dr, Dan Humphreys.

## 2011-06-25 ENCOUNTER — Telehealth: Payer: Self-pay | Admitting: Internal Medicine

## 2011-06-25 ENCOUNTER — Encounter: Payer: Self-pay | Admitting: Internal Medicine

## 2011-06-25 NOTE — Telephone Encounter (Signed)
Lower extremity arterial ultrasound abnormal with medial calcification on the right.

## 2011-06-28 ENCOUNTER — Telehealth: Payer: Self-pay | Admitting: Internal Medicine

## 2011-06-28 NOTE — Telephone Encounter (Signed)
Refill on Ambien with the instructions for 5- 10 mg daily as needed.

## 2011-06-28 NOTE — Telephone Encounter (Signed)
Fine to refill this 

## 2011-07-01 MED ORDER — ZOLPIDEM TARTRATE 5 MG PO TABS: ORAL_TABLET | ORAL | Status: DC

## 2011-07-01 NOTE — Telephone Encounter (Signed)
Rx called to pharmacy

## 2011-07-02 ENCOUNTER — Encounter: Payer: Self-pay | Admitting: Internal Medicine

## 2011-07-14 ENCOUNTER — Ambulatory Visit: Payer: Medicare Other | Admitting: Internal Medicine

## 2011-07-14 ENCOUNTER — Ambulatory Visit: Payer: Self-pay | Admitting: Internal Medicine

## 2011-07-15 ENCOUNTER — Encounter: Payer: Self-pay | Admitting: Internal Medicine

## 2011-07-15 ENCOUNTER — Ambulatory Visit (INDEPENDENT_AMBULATORY_CARE_PROVIDER_SITE_OTHER): Payer: Medicare Other | Admitting: Internal Medicine

## 2011-07-15 VITALS — BP 135/81 | HR 75 | Ht 67.0 in | Wt 196.0 lb

## 2011-07-15 DIAGNOSIS — R230 Cyanosis: Secondary | ICD-10-CM

## 2011-07-15 DIAGNOSIS — M79621 Pain in right upper arm: Secondary | ICD-10-CM

## 2011-07-15 DIAGNOSIS — M79609 Pain in unspecified limb: Secondary | ICD-10-CM

## 2011-07-15 DIAGNOSIS — E785 Hyperlipidemia, unspecified: Secondary | ICD-10-CM

## 2011-07-15 DIAGNOSIS — L719 Rosacea, unspecified: Secondary | ICD-10-CM

## 2011-07-15 DIAGNOSIS — D649 Anemia, unspecified: Secondary | ICD-10-CM

## 2011-07-15 DIAGNOSIS — I10 Essential (primary) hypertension: Secondary | ICD-10-CM

## 2011-07-15 DIAGNOSIS — E669 Obesity, unspecified: Secondary | ICD-10-CM

## 2011-07-15 MED ORDER — DOXYCYCLINE HYCLATE 50 MG PO CAPS: 50.0000 mg | ORAL_CAPSULE | Freq: Every day | ORAL | Status: DC

## 2011-07-15 NOTE — Patient Instructions (Signed)
Taking off Pounds Sensibly (TOPS) - meeting at [facility]

## 2011-07-15 NOTE — Assessment & Plan Note (Signed)
Recent arterial dopplers normal. Will continue to monitor.

## 2011-07-15 NOTE — Assessment & Plan Note (Signed)
BP well controlled. Will continue current medications.

## 2011-07-15 NOTE — Progress Notes (Signed)
Subjective:    Patient ID: Alexandra Bishop, female    DOB: 1945-06-05, 66 y.o.   MRN: [id number]  HPI 66 year old female with history of hypothyroidism, rosacea, osteoarthritis, and hypertension presents for followup. In regards to her rosacea, she questions whether continuing doxycycline is the best course. She notes some improvement in her symptoms of red skin over her face with this medication. She does note some sensitivity to this with this medication.  In regards to her osteoarthritis, she reports improvement in her symptoms after starting physical therapy.  In regards to her history of hypertension and hypothyroidism, she reports full compliance with her medications. There was some concern about cyanosis in her feet and she underwent Dopplers of her lower extremity with vascular surgery which were both normal.  In regards to her history of obesity, she reports continued efforts at healthy diet and physical activity. She notes that her weight has dropped almost 20 pounds over the last 6 months.  Outpatient Encounter Prescriptions as of 07/15/2011  Medication Sig Dispense Refill  . amLODipine (NORVASC) 10 MG tablet Take 1 tablet (10 mg total) by mouth daily.  90 tablet  3  . aspirin 81 MG EC tablet Take 81 mg by mouth daily.        Marland Kitchen doxycycline (VIBRAMYCIN) 50 MG capsule Take 1 capsule (50 mg total) by mouth daily.  90 capsule  1  . levothyroxine (SYNTHROID, LEVOTHROID) 50 MCG tablet Take 100 mcg by mouth daily.       . meloxicam (MOBIC) 15 MG tablet Take 15 mg by mouth daily.      . Multiple Vitamin (MULTIVITAMIN) tablet Take 1 tablet by mouth daily.        . vitamin D, CHOLECALCIFEROL, 400 UNITS tablet Take 800 Units by mouth daily.       Marland Kitchen zolpidem (AMBIEN) 5 MG tablet Take one to two tablets as needed at bedtime.  60 tablet  1  . DISCONTD: doxycycline (VIBRAMYCIN) 50 MG capsule Take 1 capsule (50 mg total) by mouth daily.  90 capsule  1  . b complex vitamins tablet Take 1 tablet by  mouth daily.          Review of Systems  Constitutional: Negative for fever, chills, appetite change, fatigue and unexpected weight change.  HENT: Negative for ear pain, congestion, sore throat, trouble swallowing, neck pain, voice change and sinus pressure.   Eyes: Negative for visual disturbance.  Respiratory: Negative for cough, shortness of breath, wheezing and stridor.   Cardiovascular: Negative for chest pain, palpitations and leg swelling.  Gastrointestinal: Negative for nausea, vomiting, abdominal pain, diarrhea, constipation, blood in stool, abdominal distention and anal bleeding.  Genitourinary: Negative for dysuria and flank pain.  Musculoskeletal: Positive for myalgias and arthralgias. Negative for gait problem.  Skin: Negative for color change and rash.  Neurological: Negative for dizziness and headaches.  Hematological: Negative for adenopathy. Does not bruise/bleed easily.  Psychiatric/Behavioral: Negative for suicidal ideas, sleep disturbance and dysphoric mood. The patient is not nervous/anxious.        Objective:   Physical Exam  Constitutional: She is oriented to person, place, and time. She appears well-developed and well-nourished. No distress.  HENT:  Head: Normocephalic and atraumatic.  Right Ear: External ear normal.  Left Ear: External ear normal.  Nose: Nose normal.  Mouth/Throat: Oropharynx is clear and moist. No oropharyngeal exudate.  Eyes: Conjunctivae are normal. Pupils are equal, round, and reactive to light. Right eye exhibits no discharge. Left eye exhibits no  discharge. No scleral icterus.  Neck: Normal range of motion. Neck supple. No tracheal deviation present. No thyromegaly present.  Cardiovascular: Normal rate, regular rhythm, normal heart sounds and intact distal pulses.  Exam reveals no gallop and no friction rub.   No murmur heard. Pulmonary/Chest: Effort normal and breath sounds normal. No respiratory distress. She has no wheezes. She has no  rales. She exhibits no tenderness.  Musculoskeletal: Normal range of motion. She exhibits no edema and no tenderness.  Lymphadenopathy:    She has no cervical adenopathy.  Neurological: She is alert and oriented to person, place, and time. No cranial nerve deficit. She exhibits normal muscle tone. Coordination normal.  Skin: Skin is warm and dry. No rash noted. She is not diaphoretic. No erythema. No pallor.  Psychiatric: She has a normal mood and affect. Her behavior is normal. Judgment and thought content normal.          Assessment & Plan:

## 2011-07-15 NOTE — Assessment & Plan Note (Signed)
Symptoms improved with PT. Will continue to monitor. 

## 2011-07-15 NOTE — Assessment & Plan Note (Signed)
Congratulated pt on nearly 20lb weight loss. Encouraged her to continue with efforts at diet and exercise.

## 2011-07-16 LAB — COMPREHENSIVE METABOLIC PANEL
ALT: 27 U/L (ref 0–35)
AST: 25 U/L (ref 0–37)
Albumin: 4.2 g/dL (ref 3.5–5.2)
Alkaline Phosphatase: 81 U/L (ref 39–117)
BUN: 18 mg/dL (ref 6–23)
CO2: 25 mEq/L (ref 19–32)
Calcium: 9.2 mg/dL (ref 8.4–10.5)
Chloride: 103 mEq/L (ref 96–112)
Creatinine, Ser: 0.8 mg/dL (ref 0.4–1.2)
GFR: 75.83 mL/min (ref >60.00)
Glucose, Bld: 88 mg/dL (ref 70–99)
Potassium: 4.2 mEq/L (ref 3.5–5.1)
Sodium: 138 mEq/L (ref 135–145)
Total Bilirubin: 0.6 mg/dL (ref 0.3–1.2)
Total Protein: 6.7 g/dL (ref 6.0–8.3)

## 2011-07-16 LAB — CBC WITH DIFFERENTIAL/PLATELET
Basophils Absolute: 0.1 10*3/uL (ref 0.0–0.1)
Basophils Relative: 0.9 % (ref 0.0–3.0)
Eosinophils Absolute: 0.2 10*3/uL (ref 0.0–0.7)
Eosinophils Relative: 2.4 % (ref 0.0–5.0)
HCT: 47.7 % — ABNORMAL HIGH (ref 36.0–46.0)
Hemoglobin: 15.7 g/dL — ABNORMAL HIGH (ref 12.0–15.0)
Lymphocytes Relative: 15.4 % (ref 12.0–46.0)
Lymphs Abs: 1.2 10*3/uL (ref 0.7–4.0)
MCHC: 32.8 g/dL (ref 30.0–36.0)
MCV: 86.5 fl (ref 78.0–100.0)
Monocytes Absolute: 0.5 10*3/uL (ref 0.1–1.0)
Monocytes Relative: 7.1 % (ref 3.0–12.0)
Neutro Abs: 5.6 10*3/uL (ref 1.4–7.7)
Neutrophils Relative %: 74.2 % (ref 43.0–77.0)
Platelets: 217 10*3/uL (ref 150.0–400.0)
RBC: 5.52 Mil/uL — ABNORMAL HIGH (ref 3.87–5.11)
RDW: 13.4 % (ref 11.5–14.6)
WBC: 7.5 10*3/uL (ref 4.5–10.5)

## 2011-07-16 LAB — LIPID PANEL
Cholesterol: 191 mg/dL (ref 0–200)
HDL: 66.3 mg/dL (ref >39.00)
LDL Cholesterol: 103 mg/dL — ABNORMAL HIGH (ref 0–99)
Total CHOL/HDL Ratio: 3
Triglycerides: 111 mg/dL (ref 0.0–149.0)
VLDL: 22.2 mg/dL (ref 0.0–40.0)

## 2011-07-21 ENCOUNTER — Ambulatory Visit: Payer: Self-pay | Admitting: Internal Medicine

## 2011-07-28 ENCOUNTER — Telehealth: Payer: Self-pay | Admitting: Internal Medicine

## 2011-07-28 DIAGNOSIS — Z1239 Encounter for other screening for malignant neoplasm of breast: Secondary | ICD-10-CM

## 2011-07-28 NOTE — Telephone Encounter (Signed)
[phone] Pt came in she received letter from [facility] it is time for her mammogram.   We need order to schedule this thanks   Pt would like late may early June or July  Mid morning

## 2011-07-28 NOTE — Telephone Encounter (Signed)
OK  Order entered

## 2011-07-29 NOTE — Telephone Encounter (Signed)
Appointment 08/31/11 @ 11:30 [facility] Left message on pt voice mail with date and time

## 2011-08-12 ENCOUNTER — Other Ambulatory Visit: Payer: Self-pay | Admitting: Internal Medicine

## 2011-08-12 MED ORDER — LEVOTHYROXINE SODIUM 100 MCG PO TABS: 100.0000 ug | ORAL_TABLET | Freq: Every day | ORAL | Status: DC

## 2011-08-20 ENCOUNTER — Encounter: Payer: Self-pay | Admitting: Internal Medicine

## 2011-08-20 ENCOUNTER — Ambulatory Visit (INDEPENDENT_AMBULATORY_CARE_PROVIDER_SITE_OTHER): Payer: Medicare Other | Admitting: Internal Medicine

## 2011-08-20 VITALS — BP 135/78 | HR 51 | Temp 97.8°F | Resp 16 | Wt 195.5 lb

## 2011-08-20 DIAGNOSIS — L820 Inflamed seborrheic keratosis: Secondary | ICD-10-CM | POA: Insufficient documentation

## 2011-08-20 DIAGNOSIS — L821 Other seborrheic keratosis: Secondary | ICD-10-CM | POA: Insufficient documentation

## 2011-08-20 DIAGNOSIS — I872 Venous insufficiency (chronic) (peripheral): Secondary | ICD-10-CM

## 2011-08-20 DIAGNOSIS — B379 Candidiasis, unspecified: Secondary | ICD-10-CM

## 2011-08-20 MED ORDER — NYSTATIN-TRIAMCINOLONE 100000-0.1 UNIT/GM-% EX OINT: TOPICAL_OINTMENT | Freq: Two times a day (BID) | CUTANEOUS | Status: DC

## 2011-08-20 NOTE — Progress Notes (Signed)
Subjective:    Patient ID: Alexandra Bishop, female    DOB: [birth year], 66 y.o.   MRN: [id number]  HPI 66 year old female with history of hypothyroidism, hypertension presents for acute visit. She has several concerns today. First, she notes rash over her abdomen and under her breast which is been present for the last month or so. It is described as raised and itching. She has been applying miconazole powder under her breasts with some improvement. She denies any fever or chills.  She is also concerned today about swelling in her lower extremities. She reports that she took a Road trip which was over 10 hours. After the trip she had extensive swelling in both of her lower extremities. She notes some varicosities in her bilateral calves. She denies any pain in her calves. The swelling in her leg resolved after a couple of days.    Outpatient Encounter Prescriptions as of 08/20/2011  Medication Sig Dispense Refill  . amLODipine (NORVASC) 10 MG tablet Take 1 tablet (10 mg total) by mouth daily.  90 tablet  3  . aspirin 81 MG EC tablet Take 81 mg by mouth daily.        Marland Kitchen doxycycline (VIBRAMYCIN) 50 MG capsule Take 1 capsule (50 mg total) by mouth daily.  90 capsule  1  . fish oil-omega-3 fatty acids 1000 MG capsule Take 1 g by mouth every other day.      . levothyroxine (SYNTHROID, LEVOTHROID) 100 MCG tablet Take 1 tablet (100 mcg total) by mouth daily.  90 tablet  3  . meloxicam (MOBIC) 15 MG tablet Take 15 mg by mouth daily.      . Multiple Vitamin (MULTIVITAMIN) tablet Take 1 tablet by mouth every other day.       . vitamin D, CHOLECALCIFEROL, 400 UNITS tablet Take 800 Units by mouth daily.       Marland Kitchen zolpidem (AMBIEN) 5 MG tablet Take one to two tablets as needed at bedtime.  60 tablet  1  . nystatin-triamcinolone ointment (MYCOLOG) Apply topically 2 (two) times daily.  60 g  3  . DISCONTD: b complex vitamins tablet Take 1 tablet by mouth daily.          Review of Systems  Constitutional: Negative  for fever, chills, appetite change, fatigue and unexpected weight change.  Eyes: Negative for visual disturbance.  Respiratory: Negative for cough, shortness of breath, wheezing and stridor.   Cardiovascular: Positive for leg swelling. Negative for chest pain and palpitations.  Gastrointestinal: Negative for vomiting and abdominal pain.  Genitourinary: Negative for dysuria and flank pain.  Musculoskeletal: Negative for myalgias, arthralgias and gait problem.  Skin: Positive for pallor. Negative for color change and rash.  Neurological: Negative for dizziness and headaches.  Hematological: Negative for adenopathy. Does not bruise/bleed easily.  Psychiatric/Behavioral: Negative for suicidal ideas, sleep disturbance and dysphoric mood. The patient is not nervous/anxious.    BP 135/78  Pulse 51  Temp(Src) 97.8 F (36.6 C) (Oral)  Resp 16  Wt 195 lb 8 oz (88.678 kg)  SpO2 99%     Objective:   Physical Exam  Constitutional: She is oriented to person, place, and time. She appears well-developed and well-nourished. No distress.  HENT:  Head: Normocephalic and atraumatic.  Right Ear: External ear normal.  Left Ear: External ear normal.  Nose: Nose normal.  Mouth/Throat: Oropharynx is clear and moist. No oropharyngeal exudate.  Eyes: Conjunctivae are normal. Pupils are equal, round, and reactive to light. Right eye exhibits no  discharge. Left eye exhibits no discharge. No scleral icterus.  Neck: Normal range of motion. Neck supple. No tracheal deviation present. No thyromegaly present.  Cardiovascular: Normal rate, regular rhythm, normal heart sounds and intact distal pulses.  Exam reveals no gallop and no friction rub.   No murmur heard. Pulmonary/Chest: Effort normal and breath sounds normal. No respiratory distress. She has no wheezes. She has no rales. She exhibits no tenderness.  Abdominal: Soft. She exhibits no distension.  Musculoskeletal: Normal range of motion. She exhibits no  edema and no tenderness.       Right lower leg: She exhibits no edema.       Left lower leg: She exhibits no edema.       Varicosities noted bilateral LE  Lymphadenopathy:    She has no cervical adenopathy.  Neurological: She is alert and oriented to person, place, and time. No cranial nerve deficit. She exhibits normal muscle tone. Coordination normal.  Skin: Skin is warm and dry. Rash noted. Rash is maculopapular. She is not diaphoretic. No erythema. No pallor.     Psychiatric: She has a normal mood and affect. Her behavior is normal. Judgment and thought content normal.          Assessment & Plan:

## 2011-08-20 NOTE — Assessment & Plan Note (Signed)
Exam consistent with topical candidiasis under the breast. Will treat with nystatin/triamcinolone. Patient has followup scheduled with her dermatologist in the next few weeks. She will call if symptoms are not improving.

## 2011-08-20 NOTE — Assessment & Plan Note (Signed)
Symptoms and exam are consistent with chronic venous insufficiency in bilateral lower extremities. We discussed potential referral to vascular surgery for further evaluation likely to include venous Dopplers. She would like to defer at this time. She will call if symptoms are worsening.

## 2011-08-20 NOTE — Assessment & Plan Note (Addendum)
Numerous SKs over her trunk. We discussed that these are benign lesions, however some of them are inflamed, mostly the ones which are irritated by the bra strap.  Patient has followup with her dermatologist later this summer to discuss potentially removing these lesions.

## 2011-08-24 LAB — HM MAMMOGRAPHY: HM Mammogram: NORMAL

## 2011-08-31 ENCOUNTER — Ambulatory Visit: Payer: Self-pay | Admitting: Internal Medicine

## 2011-09-03 ENCOUNTER — Other Ambulatory Visit: Payer: Self-pay | Admitting: *Deleted

## 2011-09-03 MED ORDER — ZOLPIDEM TARTRATE 5 MG PO TABS: ORAL_TABLET | ORAL | Status: DC

## 2011-09-03 NOTE — Telephone Encounter (Signed)
Rx called to [facility] per patients request.

## 2011-09-06 ENCOUNTER — Encounter: Payer: Self-pay | Admitting: Internal Medicine

## 2011-09-07 ENCOUNTER — Other Ambulatory Visit: Payer: Self-pay | Admitting: Internal Medicine

## 2011-09-15 ENCOUNTER — Ambulatory Visit (INDEPENDENT_AMBULATORY_CARE_PROVIDER_SITE_OTHER): Payer: Medicare Other | Admitting: Internal Medicine

## 2011-09-15 ENCOUNTER — Encounter: Payer: Self-pay | Admitting: Internal Medicine

## 2011-09-15 VITALS — BP 122/80 | HR 73 | Temp 98.1°F | Ht 67.0 in | Wt 198.5 lb

## 2011-09-15 DIAGNOSIS — R609 Edema, unspecified: Secondary | ICD-10-CM | POA: Insufficient documentation

## 2011-09-15 NOTE — Assessment & Plan Note (Signed)
Consistent with chronic venous insufficiency, however given recent travel to [place], will set up venous doppler at [facility]

## 2011-09-15 NOTE — Progress Notes (Signed)
  Subjective:    Patient ID: Alexandra Bishop, female    DOB: 1945-11-16, 66 y.o.   MRN: [id number]  HPI 66YO female with history of HTN and obesity presents for acute visit complaining of bilateral LE edema. Has had trace edema in past, but symptoms recently worsened. Took trip to [place] last week, and noted swelling afterward. No pain in calves. No dyspnea. No overlying skin changes.  Outpatient Encounter Prescriptions as of 09/15/2011  Medication Sig Dispense Refill  . amLODipine (NORVASC) 10 MG tablet Take 1 tablet (10 mg total) by mouth daily.  90 tablet  3  . aspirin 81 MG EC tablet Take 81 mg by mouth daily.        Marland Kitchen doxycycline (VIBRAMYCIN) 50 MG capsule Take 1 capsule (50 mg total) by mouth daily.  90 capsule  1  . fish oil-omega-3 fatty acids 1000 MG capsule Take 1 g by mouth every other day.      . levothyroxine (SYNTHROID, LEVOTHROID) 100 MCG tablet Take 1 tablet (100 mcg total) by mouth daily.  90 tablet  3  . meloxicam (MOBIC) 15 MG tablet Take 15 mg by mouth daily.      . meloxicam (MOBIC) 7.5 MG tablet TAKE 1 TABLET BY MOUTH ONCE A DAY  90 tablet  1  . Multiple Vitamin (MULTIVITAMIN) tablet Take 1 tablet by mouth every other day.       . nystatin-triamcinolone ointment (MYCOLOG) Apply topically 2 (two) times daily.  60 g  3  . vitamin D, CHOLECALCIFEROL, 400 UNITS tablet Take 800 Units by mouth daily.       Marland Kitchen zolpidem (AMBIEN) 5 MG tablet Take one to two tablets as needed at bedtime.  60 tablet  1   BP 122/80  Pulse 73  Temp 98.1 F (36.7 C) (Oral)  Ht 5\' 7"  (1.702 m)  Wt 198 lb 8 oz (90.039 kg)  BMI 31.09 kg/m2  SpO2 99%  Review of Systems  Constitutional: Negative for fever, chills and fatigue.  Respiratory: Negative for cough and shortness of breath.   Cardiovascular: Positive for leg swelling. Negative for chest pain.  Gastrointestinal: Negative for abdominal pain.       Objective:   Physical Exam  Constitutional: She appears well-developed and well-nourished.  No distress.  HENT:  Head: Normocephalic and atraumatic.  Eyes: Pupils are equal, round, and reactive to light.  Neck: Normal range of motion.  Pulmonary/Chest: Effort normal.  Musculoskeletal: She exhibits edema (pitting edema left>right to mid shin).  Skin: She is not diaphoretic.          Assessment & Plan:

## 2011-09-20 ENCOUNTER — Telehealth: Payer: Self-pay | Admitting: *Deleted

## 2011-09-20 NOTE — Telephone Encounter (Signed)
Patient advised as instructed via telephone, she is interested in the ablation.

## 2011-09-20 NOTE — Telephone Encounter (Signed)
Left message on cell phone voicemail for patient to return call. 

## 2011-09-20 NOTE — Telephone Encounter (Signed)
Reports from [facility] show no blood clots in the lower legs, however the small saphenous vein does not work properly, ie there is reflux of blood from the valves. They may have recommended laser ablation of these veins to help with swelling.

## 2011-09-20 NOTE — Telephone Encounter (Signed)
Patient called to see if we have received the report from [facility].  Office notes given to Dr. Dan Humphreys for review.

## 2011-09-20 NOTE — Telephone Encounter (Signed)
Can you make follow up appointment for pt with [facility]? She wants to discussed laser vein ablation.

## 2011-09-28 ENCOUNTER — Encounter: Payer: Self-pay | Admitting: Internal Medicine

## 2011-09-28 NOTE — Telephone Encounter (Signed)
Patient will call her insurance company to see if she is covered to have this procedure.

## 2011-10-20 ENCOUNTER — Encounter: Payer: Self-pay | Admitting: Internal Medicine

## 2011-10-20 ENCOUNTER — Ambulatory Visit (INDEPENDENT_AMBULATORY_CARE_PROVIDER_SITE_OTHER): Payer: Medicare Other | Admitting: Internal Medicine

## 2011-10-20 VITALS — BP 142/90 | HR 69 | Temp 98.1°F | Ht 67.0 in | Wt 195.5 lb

## 2011-10-20 DIAGNOSIS — M79609 Pain in unspecified limb: Secondary | ICD-10-CM

## 2011-10-20 DIAGNOSIS — M79621 Pain in right upper arm: Secondary | ICD-10-CM

## 2011-10-20 DIAGNOSIS — I10 Essential (primary) hypertension: Secondary | ICD-10-CM

## 2011-10-20 DIAGNOSIS — R609 Edema, unspecified: Secondary | ICD-10-CM

## 2011-10-20 DIAGNOSIS — E039 Hypothyroidism, unspecified: Secondary | ICD-10-CM

## 2011-10-20 NOTE — Assessment & Plan Note (Signed)
Consistent with nerve impingement syndrome. S/p physical therapy with minimal improvement. Using meloxicam with some improvement. Discussed using additional medication such as Tramadol at night, but she would like to hold off for now.

## 2011-10-20 NOTE — Progress Notes (Signed)
Subjective:    Patient ID: Alexandra Bishop, female    DOB: [birth year], 66 y.o.   MRN: [id number]  HPI 66 year old female with history of hypertension, hypothyroidism, chronic right shoulder pain, and venous insufficiency of the lower extremities presents for followup. In regards to her lower extremity edema, she reports she was recently seen by vascular surgery and found to have venous insufficiency in both lower legs. She is trying a conservative approach with use of lower extremity compression stockings. She reports some improvement in her symptoms with this.  In regards to chronic right shoulder pain, she reports minimal improvement after physical therapy. She was diagnosed by orthopedics as having impingement syndrome. However, she notes that certain activities such as raking leaves in her yard ten-day exacerbated her symptoms. She continues taking meloxicam with relief of her pain. Pain does not wake her from sleep at night. It is described as aching and does not radiate.  She notes some recent increase in anxiety and is currently undergoing counseling with her husband. She does not wish to take medication for this.  Outpatient Encounter Prescriptions as of 10/20/2011  Medication Sig Dispense Refill  . amLODipine (NORVASC) 10 MG tablet Take 1 tablet (10 mg total) by mouth daily.  90 tablet  3  . aspirin 81 MG EC tablet Take 81 mg by mouth daily.        Marland Kitchen doxycycline (VIBRAMYCIN) 50 MG capsule Take 1 capsule (50 mg total) by mouth daily.  90 capsule  1  . fish oil-omega-3 fatty acids 1000 MG capsule Take 1 g by mouth every other day.      . levothyroxine (SYNTHROID, LEVOTHROID) 100 MCG tablet Take 1 tablet (100 mcg total) by mouth daily.  90 tablet  3  . meloxicam (MOBIC) 15 MG tablet Take 15 mg by mouth daily.      . Multiple Vitamin (MULTIVITAMIN) tablet Take 1 tablet by mouth every other day.       . nystatin-triamcinolone ointment (MYCOLOG) Apply topically 2 (two) times daily.  60 g  3  .  vitamin D, CHOLECALCIFEROL, 400 UNITS tablet Take 800 Units by mouth daily.       Marland Kitchen zolpidem (AMBIEN) 5 MG tablet Take one to two tablets as needed at bedtime.  60 tablet  1  . DISCONTD: meloxicam (MOBIC) 7.5 MG tablet TAKE 1 TABLET BY MOUTH ONCE A DAY  90 tablet  1   BP 142/90  Pulse 69  Temp 98.1 F (36.7 C) (Oral)  Ht 5\' 7"  (1.702 m)  Wt 195 lb 8 oz (88.678 kg)  BMI 30.62 kg/m2  SpO2 98%  Review of Systems  Constitutional: Negative for fever, chills, appetite change, fatigue and unexpected weight change.  HENT: Negative for ear pain, congestion, sore throat, trouble swallowing, neck pain, voice change and sinus pressure.   Eyes: Negative for visual disturbance.  Respiratory: Negative for cough, shortness of breath, wheezing and stridor.   Cardiovascular: Positive for leg swelling. Negative for chest pain and palpitations.  Gastrointestinal: Negative for nausea, vomiting, abdominal pain, diarrhea, constipation, blood in stool, abdominal distention and anal bleeding.  Genitourinary: Negative for dysuria and flank pain.  Musculoskeletal: Positive for myalgias and arthralgias. Negative for gait problem.  Skin: Negative for color change and rash.  Neurological: Negative for dizziness and headaches.  Hematological: Negative for adenopathy. Does not bruise/bleed easily.  Psychiatric/Behavioral: Negative for suicidal ideas, disturbed wake/sleep cycle and dysphoric mood. The patient is not nervous/anxious.  Objective:   Physical Exam  Constitutional: She is oriented to person, place, and time. She appears well-developed and well-nourished. No distress.  HENT:  Head: Normocephalic and atraumatic.  Right Ear: External ear normal.  Left Ear: External ear normal.  Nose: Nose normal.  Mouth/Throat: Oropharynx is clear and moist. No oropharyngeal exudate.  Eyes: Conjunctivae are normal. Pupils are equal, round, and reactive to light. Right eye exhibits no discharge. Left eye exhibits  no discharge. No scleral icterus.  Neck: Normal range of motion. Neck supple. No tracheal deviation present. No thyromegaly present.  Cardiovascular: Normal rate, regular rhythm, normal heart sounds and intact distal pulses.  Exam reveals no gallop and no friction rub.   No murmur heard. Pulmonary/Chest: Effort normal and breath sounds normal. No respiratory distress. She has no wheezes. She has no rales. She exhibits no tenderness.  Musculoskeletal: Normal range of motion. She exhibits no edema and no tenderness.       Right shoulder: She exhibits pain. She exhibits normal range of motion and no tenderness.  Lymphadenopathy:    She has no cervical adenopathy.  Neurological: She is alert and oriented to person, place, and time. No cranial nerve deficit. She exhibits normal muscle tone. Coordination normal.  Skin: Skin is warm and dry. No rash noted. She is not diaphoretic. No erythema. No pallor.  Psychiatric: She has a normal mood and affect. Her behavior is normal. Judgment and thought content normal.          Assessment & Plan:

## 2011-10-20 NOTE — Assessment & Plan Note (Signed)
Symptoms improved with use of compression stockings. Will continue to monitor.

## 2011-10-20 NOTE — Assessment & Plan Note (Signed)
BP slightly elevated today, however pt reports increased anxiety recently, suspect this is contributing. Will continue to monitor. Continue Amlodipine. Follow up 3 months.

## 2011-11-03 ENCOUNTER — Other Ambulatory Visit: Payer: Self-pay | Admitting: *Deleted

## 2011-11-03 MED ORDER — ZOLPIDEM TARTRATE 5 MG PO TABS: ORAL_TABLET | ORAL | Status: DC

## 2011-11-03 NOTE — Telephone Encounter (Signed)
[facility].

## 2011-11-04 ENCOUNTER — Other Ambulatory Visit: Payer: Self-pay | Admitting: *Deleted

## 2011-11-04 MED ORDER — ZOLPIDEM TARTRATE 5 MG PO TABS: ORAL_TABLET | ORAL | Status: DC

## 2011-12-10 ENCOUNTER — Other Ambulatory Visit: Payer: Self-pay | Admitting: *Deleted

## 2011-12-10 ENCOUNTER — Telehealth: Payer: Self-pay | Admitting: Internal Medicine

## 2011-12-10 MED ORDER — ZOSTER VACCINE LIVE 19400 UNT/0.65ML ~~LOC~~ SOLR: 0.6500 mL | Freq: Once | SUBCUTANEOUS | Status: DC

## 2011-12-10 NOTE — Telephone Encounter (Signed)
Rx sent to [facility] pharmacy per patients request because [facility]'t [facility] the vaccine.

## 2011-12-10 NOTE — Telephone Encounter (Signed)
Pt wanted to know where she can get a shingles vac cheaper than here in the office

## 2011-12-10 NOTE — Telephone Encounter (Signed)
Patient notified she can get the vaccine at the pharmacy with an Rx from our office.  Please advise on the Rx.

## 2011-12-10 NOTE — Telephone Encounter (Signed)
We can call in an Rx for zostavax wherever she would like.

## 2011-12-10 NOTE — Telephone Encounter (Signed)
Zostavax vaccine sent to pharmacy, message left on cell phone voicemail advising patient as instructed.

## 2011-12-30 ENCOUNTER — Other Ambulatory Visit: Payer: Self-pay | Admitting: Internal Medicine

## 2012-01-01 ENCOUNTER — Other Ambulatory Visit: Payer: Self-pay | Admitting: Internal Medicine

## 2012-01-05 ENCOUNTER — Other Ambulatory Visit: Payer: Self-pay | Admitting: Internal Medicine

## 2012-01-05 ENCOUNTER — Telehealth: Payer: Self-pay | Admitting: Internal Medicine

## 2012-01-05 NOTE — Telephone Encounter (Signed)
Pt called checking on her rx ambiem.  She just called her pharmacy and they didn't have it [facility]

## 2012-01-05 NOTE — Telephone Encounter (Signed)
Pt is needing Ambien refilled she uses [facility]. Pt is completely out and she will be traveling so she will need to by today.

## 2012-01-06 NOTE — Telephone Encounter (Signed)
[facility] called [facility]. Pt called again this am.

## 2012-01-18 ENCOUNTER — Other Ambulatory Visit (INDEPENDENT_AMBULATORY_CARE_PROVIDER_SITE_OTHER): Payer: Medicare Other

## 2012-01-18 DIAGNOSIS — E039 Hypothyroidism, unspecified: Secondary | ICD-10-CM

## 2012-01-18 DIAGNOSIS — I10 Essential (primary) hypertension: Secondary | ICD-10-CM

## 2012-01-18 LAB — COMPREHENSIVE METABOLIC PANEL
ALT: 28 U/L (ref 0–35)
AST: 27 U/L (ref 0–37)
Albumin: 3.7 g/dL (ref 3.5–5.2)
Alkaline Phosphatase: 80 U/L (ref 39–117)
BUN: 16 mg/dL (ref 6–23)
CO2: 28 mEq/L (ref 19–32)
Calcium: 8.7 mg/dL (ref 8.4–10.5)
Chloride: 107 mEq/L (ref 96–112)
Creatinine, Ser: 0.7 mg/dL (ref 0.4–1.2)
GFR: 85.5 mL/min (ref >60.00)
Glucose, Bld: 92 mg/dL (ref 70–99)
Potassium: 3.6 mEq/L (ref 3.5–5.1)
Sodium: 140 mEq/L (ref 135–145)
Total Bilirubin: 0.8 mg/dL (ref 0.3–1.2)
Total Protein: 6.2 g/dL (ref 6.0–8.3)

## 2012-01-18 LAB — LIPID PANEL
Cholesterol: 185 mg/dL (ref 0–200)
HDL: 49.2 mg/dL (ref >39.00)
LDL Cholesterol: 115 mg/dL — ABNORMAL HIGH (ref 0–99)
Total CHOL/HDL Ratio: 4
Triglycerides: 103 mg/dL (ref 0.0–149.0)
VLDL: 20.6 mg/dL (ref 0.0–40.0)

## 2012-01-18 LAB — TSH: TSH: 1.78 u[IU]/mL (ref 0.35–5.50)

## 2012-01-24 ENCOUNTER — Ambulatory Visit (INDEPENDENT_AMBULATORY_CARE_PROVIDER_SITE_OTHER): Payer: Medicare Other | Admitting: Internal Medicine

## 2012-01-24 ENCOUNTER — Encounter: Payer: Self-pay | Admitting: Internal Medicine

## 2012-01-24 VITALS — BP 110/72 | HR 67 | Temp 98.7°F | Ht 67.0 in | Wt 199.2 lb

## 2012-01-24 DIAGNOSIS — M79621 Pain in right upper arm: Secondary | ICD-10-CM

## 2012-01-24 DIAGNOSIS — M79609 Pain in unspecified limb: Secondary | ICD-10-CM

## 2012-01-24 DIAGNOSIS — I10 Essential (primary) hypertension: Secondary | ICD-10-CM

## 2012-01-24 DIAGNOSIS — Z780 Asymptomatic menopausal state: Secondary | ICD-10-CM

## 2012-01-24 NOTE — Assessment & Plan Note (Signed)
Blood pressure well-controlled today. We'll continue current medication.

## 2012-01-24 NOTE — Assessment & Plan Note (Signed)
Pain in medial right upper arm, now appears to be more localized to bone. Question if she may have an injury to mid humerus, causing pain. Will set up plain xray for evaluation.

## 2012-01-24 NOTE — Progress Notes (Signed)
Subjective:    Patient ID: Alexandra Bishop, female    DOB: February 13, 1946, 66 y.o.   MRN: [id number]  HPI 66 year old female with history of hypertension presents for followup. She reports she is generally doing well. She notes some persistent pain in her right mid upper arm which keeps her from sleeping at night. She has tried taking meloxicam with no improvement. She also tried tramadol in the past but had an allergic reaction to this. The pain is described as aching which is worsened with pressure applied to the area. She denies any trauma to her right arm. She denies any swelling in her arm. She denies any weakness in her right arm.  Aside from this, she reports she is doing well. She reports compliance with her medications. She has been wearing compression stockings which has helped lower extremity edema  Outpatient Encounter Prescriptions as of 01/24/2012  Medication Sig Dispense Refill  . amLODipine (NORVASC) 10 MG tablet Take 1 tablet (10 mg total) by mouth daily.  90 tablet  3  . aspirin 81 MG EC tablet Take 81 mg by mouth daily.        Marland Kitchen doxycycline (VIBRAMYCIN) 50 MG capsule Take 1 capsule (50 mg total) by mouth daily.  90 capsule  1  . fish oil-omega-3 fatty acids 1000 MG capsule Take 1 g by mouth every other day.      . levothyroxine (SYNTHROID, LEVOTHROID) 100 MCG tablet Take 1 tablet (100 mcg total) by mouth daily.  90 tablet  3  . meloxicam (MOBIC) 15 MG tablet Take 15 mg by mouth daily.      . Multiple Vitamin (MULTIVITAMIN) tablet Take 1 tablet by mouth every other day.       . nystatin-triamcinolone ointment (MYCOLOG) Apply topically 2 (two) times daily.  60 g  3  . vitamin D, CHOLECALCIFEROL, 400 UNITS tablet Take 800 Units by mouth daily.       Marland Kitchen zolpidem (AMBIEN) 5 MG tablet TAKE 1 TO 2 TABLETS BY MOUTH AT BEDTIME AS NEEDED  60 tablet  1   BP 110/72  Pulse 67  Temp 98.7 F (37.1 C) (Oral)  Ht 5\' 7"  (1.702 m)  Wt 199 lb 4 oz (90.379 kg)  BMI 31.21 kg/m2  SpO2 97%  Review  of Systems  Constitutional: Negative for fever, chills, appetite change, fatigue and unexpected weight change.  HENT: Negative for ear pain, congestion, sore throat, trouble swallowing, neck pain, voice change and sinus pressure.   Eyes: Negative for visual disturbance.  Respiratory: Negative for cough, shortness of breath, wheezing and stridor.   Cardiovascular: Negative for chest pain, palpitations and leg swelling.  Gastrointestinal: Negative for nausea, vomiting, abdominal pain, diarrhea, constipation, blood in stool, abdominal distention and anal bleeding.  Genitourinary: Negative for dysuria and flank pain.  Musculoskeletal: Positive for myalgias and arthralgias. Negative for gait problem.  Skin: Negative for color change and rash.  Neurological: Negative for dizziness and headaches.  Hematological: Negative for adenopathy. Does not bruise/bleed easily.  Psychiatric/Behavioral: Negative for suicidal ideas, sleep disturbance and dysphoric mood. The patient is not nervous/anxious.        Objective:   Physical Exam  Constitutional: She is oriented to person, place, and time. She appears well-developed and well-nourished. No distress.  HENT:  Head: Normocephalic and atraumatic.  Right Ear: External ear normal.  Left Ear: External ear normal.  Nose: Nose normal.  Mouth/Throat: Oropharynx is clear and moist. No oropharyngeal exudate.  Eyes: Conjunctivae normal are normal. Pupils  are equal, round, and reactive to light. Right eye exhibits no discharge. Left eye exhibits no discharge. No scleral icterus.  Neck: Normal range of motion. Neck supple. No tracheal deviation present. No thyromegaly present.  Cardiovascular: Normal rate, regular rhythm, normal heart sounds and intact distal pulses.  Exam reveals no gallop and no friction rub.   No murmur heard. Pulmonary/Chest: Effort normal and breath sounds normal. No respiratory distress. She has no wheezes. She has no rales. She exhibits no  tenderness.  Musculoskeletal: Normal range of motion. She exhibits no edema and no tenderness.       Arms: Lymphadenopathy:    She has no cervical adenopathy.  Neurological: She is alert and oriented to person, place, and time. No cranial nerve deficit. She exhibits normal muscle tone. Coordination normal.  Skin: Skin is warm and dry. No rash noted. She is not diaphoretic. No erythema. No pallor.  Psychiatric: She has a normal mood and affect. Her behavior is normal. Judgment and thought content normal.          Assessment & Plan:

## 2012-01-26 ENCOUNTER — Ambulatory Visit (INDEPENDENT_AMBULATORY_CARE_PROVIDER_SITE_OTHER)
Admission: RE | Admit: 2012-01-26 | Discharge: 2012-01-26 | Disposition: A | Discharge status: Home / Self Care | Payer: Medicare Other | Source: Ambulatory Visit | Attending: Internal Medicine | Admitting: Internal Medicine

## 2012-01-26 DIAGNOSIS — M79609 Pain in unspecified limb: Secondary | ICD-10-CM

## 2012-01-26 DIAGNOSIS — M79621 Pain in right upper arm: Secondary | ICD-10-CM

## 2012-02-03 ENCOUNTER — Ambulatory Visit: Payer: Self-pay | Admitting: Internal Medicine

## 2012-02-03 ENCOUNTER — Telehealth: Payer: Self-pay | Admitting: Internal Medicine

## 2012-02-03 NOTE — Telephone Encounter (Signed)
Pt wants to see if she needs a follow up for her xray she had done last week. Pt has concerns about her bp being low the last time she was in.

## 2012-02-03 NOTE — Telephone Encounter (Signed)
Xray was normal. If symptoms of pain are persistent, then we can set up orthopedics referral. BP was on low side for pt, but actually normal. She can come in anytime for nurse recheck of BP.

## 2012-02-04 ENCOUNTER — Telehealth: Payer: Self-pay | Admitting: Internal Medicine

## 2012-02-04 NOTE — Telephone Encounter (Signed)
Bone Density testing showed osteopenia with T-score -1.1. I would recommend that we set up a visit to discuss treatment.

## 2012-02-04 NOTE — Telephone Encounter (Signed)
Patient advised as instructed via telephone. 

## 2012-02-13 ENCOUNTER — Other Ambulatory Visit: Payer: Self-pay | Admitting: Internal Medicine

## 2012-02-14 NOTE — Telephone Encounter (Signed)
Med filled.  

## 2012-02-21 ENCOUNTER — Encounter: Payer: Self-pay | Admitting: Internal Medicine

## 2012-03-01 ENCOUNTER — Other Ambulatory Visit: Payer: Self-pay | Admitting: General Practice

## 2012-03-01 MED ORDER — ZOLPIDEM TARTRATE 5 MG PO TABS: 5.0000 mg | ORAL_TABLET | Freq: Every evening | ORAL | Status: DC | PRN

## 2012-03-01 NOTE — Telephone Encounter (Signed)
Ambien refill request. Med last filled on 10/16 qty 90 with 1 refill. Pt last seen on 11/4. Ok to refill?

## 2012-03-20 ENCOUNTER — Telehealth: Payer: Self-pay

## 2012-03-20 NOTE — Telephone Encounter (Signed)
Pt advises that she will cut back to 1 tablet a night and re-evaluate the medication at her CPE appt in May. Pt states she just got the med filled on 12/11 and has 4 pills left would like to know if we can call her in an Rx.

## 2012-03-20 NOTE — Telephone Encounter (Signed)
FDA released new guidelines for women on Ambien, max dose 5mg  at bedtime. She will need to limit to 5mg  at bedtime. If not working, then we will need to see her to discuss alternative meds.

## 2012-03-20 NOTE — Telephone Encounter (Signed)
Pt has a question about her rx and would like a call back

## 2012-03-20 NOTE — Telephone Encounter (Signed)
Fine to call in Rx. 

## 2012-03-20 NOTE — Telephone Encounter (Signed)
Pt called stating she was taking Ambien 1-2 tablets a night. Last Rx was written for 1 tablet a night. Pt is now out of medication and wants to know what to do please advise.

## 2012-03-20 NOTE — Telephone Encounter (Signed)
Med phoned in °

## 2012-04-10 ENCOUNTER — Ambulatory Visit (INDEPENDENT_AMBULATORY_CARE_PROVIDER_SITE_OTHER): Payer: Medicare Other | Admitting: Internal Medicine

## 2012-04-10 ENCOUNTER — Encounter: Payer: Self-pay | Admitting: Internal Medicine

## 2012-04-10 VITALS — BP 124/78 | HR 60 | Temp 98.5°F | Ht 66.0 in | Wt 203.5 lb

## 2012-04-10 DIAGNOSIS — G4700 Insomnia, unspecified: Secondary | ICD-10-CM

## 2012-04-10 DIAGNOSIS — I10 Essential (primary) hypertension: Secondary | ICD-10-CM

## 2012-04-10 MED ORDER — ESZOPICLONE 2 MG PO TABS: 2.0000 mg | ORAL_TABLET | Freq: Every day | ORAL | Status: DC

## 2012-04-10 NOTE — Assessment & Plan Note (Signed)
Insomnia controlled on ambien 10mg  only. We discussed new FDA recommendation for 5mg  max dose. Insomnia not improved at this dose. Will try using Lunesta. Gave 2mg  samples. If no improvement, then will advance to 3mg . Pt will email with update and follow up in 1 month.

## 2012-04-10 NOTE — Progress Notes (Signed)
Subjective:    Patient ID: Alexandra Bishop, female    DOB: [birth year], 68 y.o.   MRN: [id number]  HPI 67 year old female with history of hypertension, hypothyroidism, and insomnia presents for followup. She is concerned about use of Ambien. We have reviewed guidelines from FDA recommending no more than 5 mg nightly of Ambien. She reports that she tried reducing to 5 mg per night but is unable to get more than 6 hours of sleep with this dosing. She would like to try another medication. She is practicing good sleep hygiene with no intake of caffeine, dark bedroom with comfortable bed, etc.  Also concerned about BP. Questions if she needs to stay on Amlodipine. No chest pain, palp, headache. No problems with medication.  Outpatient Encounter Prescriptions as of 04/10/2012  Medication Sig Dispense Refill  . amLODipine (NORVASC) 10 MG tablet Take 1 tablet (10 mg total) by mouth daily.  90 tablet  3  . aspirin 81 MG EC tablet Take 81 mg by mouth daily.        . calcium carbonate 200 MG capsule Take 250 mg by mouth daily.      Marland Kitchen doxycycline (VIBRAMYCIN) 50 MG capsule TAKE ONE CAPSULE BY MOUTH EVERY DAY  90 capsule  1  . fish oil-omega-3 fatty acids 1000 MG capsule Take 1 g by mouth every other day.      . levothyroxine (SYNTHROID, LEVOTHROID) 100 MCG tablet Take 1 tablet (100 mcg total) by mouth daily.  90 tablet  3  . meloxicam (MOBIC) 15 MG tablet Take 15 mg by mouth daily.      . Multiple Vitamin (MULTIVITAMIN) tablet Take 1 tablet by mouth every other day.       . nystatin-triamcinolone ointment (MYCOLOG) Apply topically 2 (two) times daily as needed.      . vitamin D, CHOLECALCIFEROL, 400 UNITS tablet Take 800 Units by mouth daily.       . [DISCONTINUED] nystatin-triamcinolone ointment (MYCOLOG) Apply topically 2 (two) times daily.  60 g  3  . [DISCONTINUED] zolpidem (AMBIEN) 5 MG tablet Take 1 tablet (5 mg total) by mouth at bedtime as needed for sleep.  60 tablet  1  . eszopiclone (LUNESTA) 2 MG  TABS Take 1 tablet (2 mg total) by mouth at bedtime. Take immediately before bedtime  30 tablet  4   BP 124/78  Pulse 60  Temp 98.5 F (36.9 C) (Oral)  Ht 5\' 6"  (1.676 m)  Wt 203 lb 8 oz (92.307 kg)  BMI 32.85 kg/m2  SpO2 97%  Review of Systems  Constitutional: Negative for fever, chills, appetite change, fatigue and unexpected weight change.  HENT: Negative for ear pain, congestion, sore throat, trouble swallowing, neck pain, voice change and sinus pressure.   Eyes: Negative for visual disturbance.  Respiratory: Negative for cough, shortness of breath, wheezing and stridor.   Cardiovascular: Negative for chest pain, palpitations and leg swelling.  Gastrointestinal: Negative for nausea, vomiting, abdominal pain, diarrhea, constipation, blood in stool, abdominal distention and anal bleeding.  Genitourinary: Negative for dysuria and flank pain.  Musculoskeletal: Negative for myalgias, arthralgias and gait problem.  Skin: Negative for color change and rash.  Neurological: Negative for dizziness and headaches.  Hematological: Negative for adenopathy. Does not bruise/bleed easily.  Psychiatric/Behavioral: Positive for sleep disturbance. Negative for suicidal ideas and dysphoric mood. The patient is not nervous/anxious.        Objective:   Physical Exam  Constitutional: She is oriented to person, place, and time. She  appears well-developed and well-nourished. No distress.  HENT:  Head: Normocephalic and atraumatic.  Right Ear: External ear normal.  Left Ear: External ear normal.  Nose: Nose normal.  Mouth/Throat: Oropharynx is clear and moist. No oropharyngeal exudate.  Eyes: Conjunctivae normal are normal. Pupils are equal, round, and reactive to light. Right eye exhibits no discharge. Left eye exhibits no discharge. No scleral icterus.  Neck: Normal range of motion. Neck supple. No tracheal deviation present. No thyromegaly present.  Cardiovascular: Normal rate, regular rhythm,  normal heart sounds and intact distal pulses.  Exam reveals no gallop and no friction rub.   No murmur heard. Pulmonary/Chest: Effort normal and breath sounds normal. No respiratory distress. She has no wheezes. She has no rales. She exhibits no tenderness.  Musculoskeletal: Normal range of motion. She exhibits no edema and no tenderness.  Lymphadenopathy:    She has no cervical adenopathy.  Neurological: She is alert and oriented to person, place, and time. No cranial nerve deficit. She exhibits normal muscle tone. Coordination normal.  Skin: Skin is warm and dry. No rash noted. She is not diaphoretic. No erythema. No pallor.  Psychiatric: She has a normal mood and affect. Her behavior is normal. Judgment and thought content normal.          Assessment & Plan:

## 2012-04-10 NOTE — Assessment & Plan Note (Signed)
BP well controlled today. Will continue amlodipine.

## 2012-04-12 ENCOUNTER — Other Ambulatory Visit: Payer: Self-pay

## 2012-04-12 DIAGNOSIS — G4700 Insomnia, unspecified: Secondary | ICD-10-CM

## 2012-04-12 NOTE — Telephone Encounter (Signed)
Pt came by and says that her Eszopiclone Alfonso Patten) should be 3 mg and she is needing Authorization for Amplodipine Besylate 10 mg tablets. [facility] [address]. Pt is also stating that she thinks her Meloxicam should be 15 mg instead of 7.5 mg. Pt says that she found her Calcium and Magnesium bottle with the amounts she takes Calcium is 500 mg and Magnesium 250 mg.

## 2012-04-12 NOTE — Addendum Note (Signed)
Addended by: Ronna Polio A on: 04/12/2012 11:53 AM   Modules accepted: Orders

## 2012-04-12 NOTE — Telephone Encounter (Signed)
1. I have printed a new Rx for Lunesta 3mg  daily 2. I already called in higher dose of Meloxicam (at her visit) 3. I will fill out prior auth on Amlodipine as soon as I get it.

## 2012-04-12 NOTE — Telephone Encounter (Signed)
See below

## 2012-04-13 ENCOUNTER — Telehealth: Payer: Self-pay | Admitting: Internal Medicine

## 2012-04-13 MED ORDER — ESZOPICLONE 3 MG PO TABS: 3.0000 mg | ORAL_TABLET | Freq: Every day | ORAL | Status: DC

## 2012-04-13 NOTE — Addendum Note (Signed)
Addended by: Jimmye Norman on: 04/13/2012 09:58 AM   Modules accepted: Orders

## 2012-04-13 NOTE — Telephone Encounter (Signed)
Left message on voicemail. Rx for Lunesta was called into pharmacy.   

## 2012-04-14 NOTE — Telephone Encounter (Signed)
Left message on voicemail. Rx for Alfonso Patten was called into pharmacy.

## 2012-04-17 ENCOUNTER — Other Ambulatory Visit: Payer: Self-pay | Admitting: Internal Medicine

## 2012-04-17 NOTE — Telephone Encounter (Signed)
Pt came in today  [facility] [address] to pick up her rx for lunesta  Pt stated this was $68 dollars and wanted to know if there was something else she could take.  Please advise pt

## 2012-04-17 NOTE — Telephone Encounter (Signed)
Patient also wants a script sent to pharmacy for her Meloxicam 15 mg. Patient is going to call her insurance company regarding the lunesta and find out what they will cover that will cost her less money.

## 2012-04-17 NOTE — Telephone Encounter (Signed)
See previous note. Is it okay to refill Meloxicam?

## 2012-04-18 ENCOUNTER — Other Ambulatory Visit: Payer: Self-pay | Admitting: Internal Medicine

## 2012-04-18 MED ORDER — MELOXICAM 15 MG PO TABS: 15.0000 mg | ORAL_TABLET | Freq: Every day | ORAL | Status: DC

## 2012-04-18 NOTE — Telephone Encounter (Signed)
Spoke to patient and was advised that she did not get the [facility] because it was too expensive. Patient states that she will continue to take the Ambien 5 mg. Even though she is not sleeping good. Patient states that she is okay with her Amlodipine, but will have the pharmacy contact the office if they need anything. Lunesta removed from medication list and Ambien added back on.

## 2012-04-18 NOTE — Telephone Encounter (Signed)
It does not look like Meloxicam went thru to the pharmacy. Please advise quality and number of refills.

## 2012-04-18 NOTE — Addendum Note (Signed)
Addended by: Sydell Axon C on: 04/18/2012 11:23 AM   Modules accepted: Orders

## 2012-04-18 NOTE — Addendum Note (Signed)
Addended by: Sydell Axon C on: 04/18/2012 09:04 AM   Modules accepted: Orders

## 2012-04-19 NOTE — Telephone Encounter (Signed)
Medication refilled

## 2012-06-02 ENCOUNTER — Telehealth: Payer: Self-pay

## 2012-06-02 NOTE — Telephone Encounter (Signed)
Pt is wanting to know if she could change to 7.5 mg of Ambien instead of 5 mg's. I told her that the FDA was really regulating this medication but I would def  Ask for her. We can contact her through e-mail or phone.

## 2012-06-02 NOTE — Telephone Encounter (Signed)
LMTCB

## 2012-06-02 NOTE — Telephone Encounter (Signed)
The FDA guideline for maximum dose is 5mg  daily. We can set up a visit to discuss alternative medications if she would like.

## 2012-06-05 NOTE — Telephone Encounter (Signed)
Patient informed and verbally agreed. Stated she already knew that, that was old news. And declined to schedule an appointment at this time.

## 2012-06-05 NOTE — Telephone Encounter (Signed)
She came in and discussed it with you and she tried the [place], it does leave an after test in her mouth. She does need more than 5 mg and she is willing to try the 7.5 mg. Stated you all discussed that 10 mg is maybe too much but she looked it up on the internet and found the 7.5 mg, which she is willing to try it.

## 2012-06-05 NOTE — Telephone Encounter (Signed)
The FDA guideline is ambien 5mg  max dose. We can bring in to discuss an alternative medication if she would like.

## 2012-06-14 ENCOUNTER — Other Ambulatory Visit: Payer: Self-pay | Admitting: *Deleted

## 2012-06-15 MED ORDER — ZOLPIDEM TARTRATE 5 MG PO TABS: 5.0000 mg | ORAL_TABLET | Freq: Every day | ORAL | Status: DC

## 2012-06-17 ENCOUNTER — Other Ambulatory Visit: Payer: Self-pay | Admitting: Internal Medicine

## 2012-06-24 ENCOUNTER — Other Ambulatory Visit: Payer: Self-pay | Admitting: Internal Medicine

## 2012-06-26 NOTE — Telephone Encounter (Signed)
Eprescribed.

## 2012-07-03 ENCOUNTER — Other Ambulatory Visit: Payer: Self-pay | Admitting: Internal Medicine

## 2012-07-03 NOTE — Telephone Encounter (Signed)
Eprescribed.

## 2012-07-05 NOTE — Telephone Encounter (Signed)
Please close encounter

## 2012-07-06 ENCOUNTER — Other Ambulatory Visit: Payer: Self-pay | Admitting: Internal Medicine

## 2012-07-06 NOTE — Telephone Encounter (Signed)
Eprescribed.

## 2012-07-11 ENCOUNTER — Telehealth: Payer: Self-pay | Admitting: Internal Medicine

## 2012-07-11 NOTE — Telephone Encounter (Signed)
zolpidem (AMBIEN) 5 MG tablet   Patient wanting an increase in her Ambien the 5 mg is not working for her.

## 2012-07-11 NOTE — Telephone Encounter (Signed)
We can try an alternative medication if she would like or we can refer to sleep specialist. If she wants to try another medication, we should set up a visit to discuss.

## 2012-07-11 NOTE — Telephone Encounter (Signed)
Spoke with patient and she stated the 5 mg of Ambien is not working for her at all and she is getting tired of hearing FDA guidelines does not pay for it. Informed her that I would send the message to Dr. Dan Humphreys.

## 2012-07-12 NOTE — Telephone Encounter (Signed)
Left message for patient to return phone call.  

## 2012-07-14 NOTE — Telephone Encounter (Signed)
Spoke with patient and she stated she would call back next week however she has an appointment on 5/2

## 2012-07-21 ENCOUNTER — Encounter: Payer: Medicare Other | Admitting: Internal Medicine

## 2012-08-03 ENCOUNTER — Other Ambulatory Visit: Payer: Self-pay | Admitting: Internal Medicine

## 2012-08-03 NOTE — Telephone Encounter (Signed)
Rx sent to pharmacy by escript  

## 2012-08-06 ENCOUNTER — Other Ambulatory Visit: Payer: Self-pay | Admitting: Internal Medicine

## 2012-08-23 ENCOUNTER — Telehealth: Payer: Self-pay | Admitting: *Deleted

## 2012-08-23 NOTE — Telephone Encounter (Signed)
Went to see dermatologist PA and she fainted before seeing her for her visit. Back is itching more than usual, has noticed a small bubble on her back and it is hard to see.   Occasionally her breast is aching, especially if she sleeps on the right side then it will bother her. Has breast exam just before she leaves for [place] and would like to know if you could use your influence to get her in earlier. The only reason it was scheduled for that day is due to insurance. She admit she is feeling a little paranoid because this about the same time her mother/grandmother passed away with cancer.

## 2012-08-23 NOTE — Telephone Encounter (Signed)
Does she mean that mammogram is scheduled just before she leaves? Routine mammograms are once yearly and insurance may not pay earlier. However,if we see her and examine her, we can likely get approval for diagnostic bilateral mammogram if abnormalities.  Please schedule her a visit.

## 2012-08-24 NOTE — Telephone Encounter (Signed)
Patient appointment scheduled

## 2012-08-28 ENCOUNTER — Encounter: Payer: Self-pay | Admitting: Internal Medicine

## 2012-08-28 ENCOUNTER — Ambulatory Visit (INDEPENDENT_AMBULATORY_CARE_PROVIDER_SITE_OTHER): Payer: Medicare Other | Admitting: Internal Medicine

## 2012-08-28 VITALS — BP 106/74 | HR 71 | Temp 97.7°F | Wt 206.0 lb

## 2012-08-28 DIAGNOSIS — B029 Zoster without complications: Secondary | ICD-10-CM

## 2012-08-28 DIAGNOSIS — N63 Unspecified lump in unspecified breast: Secondary | ICD-10-CM

## 2012-08-28 DIAGNOSIS — R928 Other abnormal and inconclusive findings on diagnostic imaging of breast: Secondary | ICD-10-CM

## 2012-08-28 MED ORDER — TRIAMCINOLONE ACETONIDE 0.1 % EX CREA: TOPICAL_CREAM | Freq: Two times a day (BID) | CUTANEOUS | Status: DC

## 2012-08-28 NOTE — Progress Notes (Signed)
Subjective:    Patient ID: Alexandra Bishop, female    DOB: Aug 13, 1945, 67 y.o.   MRN: [id number]  HPI 67YO female presents for acute visit. Concerned about "itchy" area on right upper back. Present about 1-2 weeks. Occasionally drains some clear fluid. No use of new lotions, soaps, perfumes. No fever, chills. Mild fatigue noted.  Also concerned about nodular areas bilateral breasts. Notes that mother had breast cancer. No personal h/o breast cancer. Long h/o intermittent breast tenderness. No overlying skin changes noted. No discharge from nipple.  Outpatient Encounter Prescriptions as of 08/28/2012  Medication Sig Dispense Refill  . amLODipine (NORVASC) 10 MG tablet TAKE 1 TABLET BY MOUTH EVERY DAY  90 tablet  1  . aspirin 81 MG EC tablet Take 81 mg by mouth daily.        . calcium carbonate 200 MG capsule Take 250 mg by mouth daily.      . fish oil-omega-3 fatty acids 1000 MG capsule Take 1 g by mouth every other day.      . levothyroxine (SYNTHROID, LEVOTHROID) 100 MCG tablet TAKE 1 TABLET BY MOUTH EVERY DAY  90 tablet  1  . meloxicam (MOBIC) 15 MG tablet TAKE 1 TABLET BY MOUTH EVERY DAY  30 tablet  3  . Multiple Vitamin (MULTIVITAMIN) tablet Take 1 tablet by mouth every other day.       . vitamin D, CHOLECALCIFEROL, 400 UNITS tablet Take 800 Units by mouth daily.       Marland Kitchen zolpidem (AMBIEN) 5 MG tablet Take 1 tablet (5 mg total) by mouth at bedtime.  30 tablet  3  . doxycycline (VIBRAMYCIN) 50 MG capsule TAKE ONE CAPSULE BY MOUTH EVERY DAY  90 capsule  0  . triamcinolone cream (KENALOG) 0.1 % Apply topically 2 (two) times daily. For irritated, itchy skin  45 g  1   No facility-administered encounter medications on file as of 08/28/2012.   BP 106/74  Pulse 71  Temp(Src) 97.7 F (36.5 C) (Oral)  Wt 206 lb (93.441 kg)  BMI 33.27 kg/m2  SpO2 97%  Review of Systems  Constitutional: Negative for fever, chills, appetite change, fatigue and unexpected weight change.  HENT: Negative for neck  pain.   Eyes: Negative for visual disturbance.  Respiratory: Negative for cough, shortness of breath, wheezing and stridor.   Cardiovascular: Negative for chest pain, palpitations and leg swelling.  Gastrointestinal: Negative for abdominal pain and abdominal distention.  Genitourinary: Negative for dysuria and flank pain.  Musculoskeletal: Negative for myalgias, arthralgias and gait problem.  Skin: Positive for color change and rash.  Neurological: Negative for dizziness and headaches.  Hematological: Negative for adenopathy. Does not bruise/bleed easily.  Psychiatric/Behavioral: Negative for suicidal ideas, sleep disturbance and dysphoric mood. The patient is not nervous/anxious.        Objective:   Physical Exam  Constitutional: She is oriented to person, place, and time. She appears well-developed and well-nourished. No distress.  HENT:  Head: Normocephalic and atraumatic.  Right Ear: External ear normal.  Left Ear: External ear normal.  Nose: Nose normal.  Mouth/Throat: Oropharynx is clear and moist.  Eyes: Conjunctivae are normal. Pupils are equal, round, and reactive to light. Right eye exhibits no discharge. Left eye exhibits no discharge. No scleral icterus.  Neck: Normal range of motion. Neck supple. No tracheal deviation present. No thyromegaly present.  Pulmonary/Chest: Effort normal. No accessory muscle usage. Not tachypneic. She has no decreased breath sounds. She has no rhonchi. Right breast exhibits tenderness.  Right breast exhibits no inverted nipple, no mass and no skin change. Left breast exhibits tenderness. Left breast exhibits no inverted nipple and no mass. Breasts are symmetrical.  Bilateral nodular breasts, however tissue is symmetric, no predominant mass.  Musculoskeletal: Normal range of motion. She exhibits no edema and no tenderness.  Lymphadenopathy:    She has no cervical adenopathy.  Neurological: She is alert and oriented to person, place, and time. No  cranial nerve deficit. She exhibits normal muscle tone. Coordination normal.  Skin: Skin is warm and dry. Rash noted. Rash is vesicular. She is not diaphoretic. There is erythema. No pallor.     Psychiatric: She has a normal mood and affect. Her behavior is normal. Judgment and thought content normal.          Assessment & Plan:

## 2012-08-28 NOTE — Assessment & Plan Note (Signed)
Breasts are nodular, but symmetric. Will schedule yearly mammogram as diagnostic mammogram for evaluation.

## 2012-08-28 NOTE — Assessment & Plan Note (Signed)
Symptoms and exam are consistent with herpes zoster. Discussed pathophysiology of this rash. Given that she is not having any pain, will hold off on oral medication for now. She is having some pruritis at the site, so will apply prn triamcinolone. She will call if symptoms worsen or change.

## 2012-08-29 ENCOUNTER — Ambulatory Visit: Payer: Self-pay | Admitting: Internal Medicine

## 2012-08-29 ENCOUNTER — Other Ambulatory Visit: Payer: Self-pay | Admitting: Internal Medicine

## 2012-08-29 DIAGNOSIS — N63 Unspecified lump in unspecified breast: Secondary | ICD-10-CM

## 2012-09-06 ENCOUNTER — Encounter: Payer: Self-pay | Admitting: Internal Medicine

## 2012-09-11 ENCOUNTER — Other Ambulatory Visit: Payer: Self-pay | Admitting: Internal Medicine

## 2012-09-12 NOTE — Telephone Encounter (Signed)
Rx called in as prescribed 

## 2012-11-17 ENCOUNTER — Ambulatory Visit (INDEPENDENT_AMBULATORY_CARE_PROVIDER_SITE_OTHER): Payer: Medicare Other | Admitting: Adult Health

## 2012-11-17 ENCOUNTER — Encounter: Payer: Self-pay | Admitting: Adult Health

## 2012-11-17 VITALS — BP 120/78 | HR 68 | Temp 98.3°F | Resp 12 | Wt 205.0 lb

## 2012-11-17 DIAGNOSIS — R5383 Other fatigue: Secondary | ICD-10-CM

## 2012-11-17 DIAGNOSIS — K3189 Other diseases of stomach and duodenum: Secondary | ICD-10-CM

## 2012-11-17 DIAGNOSIS — R5381 Other malaise: Secondary | ICD-10-CM

## 2012-11-17 DIAGNOSIS — B029 Zoster without complications: Secondary | ICD-10-CM

## 2012-11-17 DIAGNOSIS — K30 Functional dyspepsia: Secondary | ICD-10-CM | POA: Insufficient documentation

## 2012-11-17 LAB — CBC WITH DIFFERENTIAL/PLATELET
Basophils Absolute: 0 10*3/uL (ref 0.0–0.1)
Basophils Relative: 0.6 % (ref 0.0–3.0)
Eosinophils Absolute: 0.2 10*3/uL (ref 0.0–0.7)
Eosinophils Relative: 3.3 % (ref 0.0–5.0)
HCT: 47.7 % — ABNORMAL HIGH (ref 36.0–46.0)
Hemoglobin: 15.9 g/dL — ABNORMAL HIGH (ref 12.0–15.0)
Lymphocytes Relative: 17.5 % (ref 12.0–46.0)
Lymphs Abs: 1.3 10*3/uL (ref 0.7–4.0)
MCHC: 33.3 g/dL (ref 30.0–36.0)
MCV: 83.8 fl (ref 78.0–100.0)
Monocytes Absolute: 0.4 10*3/uL (ref 0.1–1.0)
Monocytes Relative: 4.7 % (ref 3.0–12.0)
Neutro Abs: 5.5 10*3/uL (ref 1.4–7.7)
Neutrophils Relative %: 73.9 % (ref 43.0–77.0)
Platelets: 268 10*3/uL (ref 150.0–400.0)
RBC: 5.7 Mil/uL — ABNORMAL HIGH (ref 3.87–5.11)
RDW: 13.2 % (ref 11.5–14.6)
WBC: 7.4 10*3/uL (ref 4.5–10.5)

## 2012-11-17 LAB — COMPREHENSIVE METABOLIC PANEL
ALT: 27 U/L (ref 0–35)
AST: 27 U/L (ref 0–37)
Albumin: 4.2 g/dL (ref 3.5–5.2)
Alkaline Phosphatase: 86 U/L (ref 39–117)
BUN: 16 mg/dL (ref 6–23)
CO2: 26 mEq/L (ref 19–32)
Calcium: 9.8 mg/dL (ref 8.4–10.5)
Chloride: 107 mEq/L (ref 96–112)
Creatinine, Ser: 0.8 mg/dL (ref 0.4–1.2)
GFR: 71.39 mL/min (ref >60.00)
Glucose, Bld: 102 mg/dL — ABNORMAL HIGH (ref 70–99)
Potassium: 3.9 mEq/L (ref 3.5–5.1)
Sodium: 139 mEq/L (ref 135–145)
Total Bilirubin: 0.6 mg/dL (ref 0.3–1.2)
Total Protein: 6.9 g/dL (ref 6.0–8.3)

## 2012-11-17 LAB — TSH: TSH: 1.01 u[IU]/mL (ref 0.35–5.50)

## 2012-11-17 NOTE — Assessment & Plan Note (Signed)
?   Mild depression. Patient not able to tell me if she is feeling depressed. Mentioned having "stressors" but did not admit nor deny depression. I will check TSH to make certain her levels are wnl. She reports taking her levothyroxine exactly as directed. She has hx of vit d deficiency but she has also been taking her supplements so I do not suspect it is her vit d. If necessary, she can have this checked at her physical which she will schedule today before leaving. I will also check her cbc and cmet. Her blood glucose level will be altered because she is not fasting. I do want to check her electrolytes, kidneys and liver. If all labs normal, pt may benefit from an SSRI.

## 2012-11-17 NOTE — Patient Instructions (Signed)
  For your stomach you can try either Zantac 75 mg twice daily or you can do Omeprazole 20 mg daily  Please have your labs drawn today.  We will contact you once the results are available.  Please schedule your physical with Dr. Dan Humphreys before you leave.

## 2012-11-17 NOTE — Progress Notes (Signed)
Subjective:    Patient ID: Alexandra Bishop, female    DOB: 04/29/1945, 67 y.o.   MRN: [id number]  HPI   Patient is a pleasant 67 y/o female who presents to clinic for follow up of shingles she had in June. She also has several concerns she wants to address. Patient mentions she is due for her physical exam. She will schedule this prior to leaving the office today.  1. Follow up on recent outbreak with shingles - The area she reports was on her upper back on the right side. She reports areas has completely healed. She is not having any secondary pain from this. Apparently her outbreak was very mild. She reports having a shingles vaccine which she believes helped. No pain reported at site.  2. Fatigue - she has been feeling low on energy. She has a history of hypothyroidism currently on levothyroxine. Her TSH has been within range. She last had this checked 1 year ago at her physical which is now due. She is really not able to state whether she is feeling some depression. She reports having stressors in her life. [place] to [place] and is enjoying the proximity of things much better. She is volunteering at [employer] and is also enjoying the interaction with people. Her and her husband are participating in [facility] [employer]  3. Stomach upset - she describes discomfort of burning. Symptoms appear to be mild; however, it is a bit challenging getting a definitive answer from patient. She denies n/v. She is wondering if her stomach problems may be coming from having shingles. She is not taking any otc medications.   Current Outpatient Prescriptions on File Prior to Visit  Medication Sig Dispense Refill  . amLODipine (NORVASC) 10 MG tablet TAKE 1 TABLET BY MOUTH EVERY DAY  90 tablet  1  . aspirin 81 MG EC tablet Take 81 mg by mouth daily.        . calcium carbonate 200 MG capsule Take 250 mg by mouth daily.      Marland Kitchen doxycycline (VIBRAMYCIN) 50 MG capsule TAKE  ONE CAPSULE BY MOUTH EVERY DAY  90 capsule  0  . fish oil-omega-3 fatty acids 1000 MG capsule Take 1 g by mouth every other day.      . levothyroxine (SYNTHROID, LEVOTHROID) 100 MCG tablet TAKE 1 TABLET BY MOUTH EVERY DAY  90 tablet  1  . meloxicam (MOBIC) 15 MG tablet TAKE 1 TABLET BY MOUTH EVERY DAY  30 tablet  3  . Multiple Vitamin (MULTIVITAMIN) tablet Take 1 tablet by mouth every other day.       . triamcinolone cream (KENALOG) 0.1 % Apply topically 2 (two) times daily. For irritated, itchy skin  45 g  1  . vitamin D, CHOLECALCIFEROL, 400 UNITS tablet Take 400 Units by mouth daily.       Marland Kitchen zolpidem (AMBIEN) 5 MG tablet TAKE 1 TABLETS BY MOUTH AT BEDTIME AS NEEDED  30 tablet  3   No current facility-administered medications on file prior to visit.     Review of Systems  Constitutional: Positive for fatigue. Negative for fever and chills.  HENT: Negative.   Respiratory: Negative.   Cardiovascular: Negative.   Gastrointestinal:       Burning sensation in stomach  Skin: Negative for rash.  All other systems reviewed and are negative.    BP 120/78  Pulse 68  Temp(Src) 98.3 F (36.8 C) (Oral)  Resp 12  Wt 205 lb (92.987 kg)  BMI 33.1 kg/m2  SpO2 99%     Objective:   Physical Exam  Constitutional: She is oriented to person, place, and time.  Overweight, pleasant female in NAD  Cardiovascular: Normal rate, regular rhythm, normal heart sounds and intact distal pulses.   Pulmonary/Chest: Effort normal and breath sounds normal. No respiratory distress. She has no wheezes. She has no rales.  Abdominal: Soft. Bowel sounds are normal. She exhibits no distension and no mass. There is no tenderness. There is no rebound and no guarding.  Musculoskeletal: Normal range of motion. She exhibits no edema.  Neurological: She is alert and oriented to person, place, and time.  Skin: Skin is warm and dry. No rash noted. No erythema.  Psychiatric: She has a normal mood and affect. Her behavior  is normal. Judgment and thought content normal.      Assessment & Plan:

## 2012-11-17 NOTE — Assessment & Plan Note (Addendum)
Suspect symptoms are more related to stress, acid reflux. Do not think this is associated with her shingles in June. Advised patient to try OTC med - either Zantac 75 mg bid or omeprazole 20 mg daily. RTC if no improvement within 2 weeks or if symptoms worsen.

## 2012-11-17 NOTE — Assessment & Plan Note (Signed)
Area on her back completely healed. No pain reported.

## 2012-11-23 ENCOUNTER — Telehealth: Payer: Self-pay | Admitting: Internal Medicine

## 2012-11-23 DIAGNOSIS — D751 Secondary polycythemia: Secondary | ICD-10-CM

## 2012-11-23 NOTE — Telephone Encounter (Signed)
I have placed order

## 2012-11-23 NOTE — Telephone Encounter (Signed)
The patient came by the office and stated she is interested in visiting Dr. Orlie Dakin to evaluate her blood levels. Please advise.

## 2012-11-24 NOTE — Telephone Encounter (Signed)
Informed patient referral has been placed. 

## 2012-11-30 ENCOUNTER — Ambulatory Visit (INDEPENDENT_AMBULATORY_CARE_PROVIDER_SITE_OTHER): Payer: Medicare Other | Admitting: Internal Medicine

## 2012-11-30 ENCOUNTER — Encounter: Payer: Self-pay | Admitting: Internal Medicine

## 2012-11-30 VITALS — BP 138/70 | HR 60 | Temp 98.0°F | Wt 204.0 lb

## 2012-11-30 DIAGNOSIS — L409 Psoriasis, unspecified: Secondary | ICD-10-CM | POA: Insufficient documentation

## 2012-11-30 DIAGNOSIS — R1013 Epigastric pain: Secondary | ICD-10-CM

## 2012-11-30 DIAGNOSIS — L408 Other psoriasis: Secondary | ICD-10-CM

## 2012-11-30 MED ORDER — DEXLANSOPRAZOLE 60 MG PO CPDR: 60.0000 mg | DELAYED_RELEASE_CAPSULE | Freq: Every day | ORAL | Status: DC

## 2012-11-30 NOTE — Patient Instructions (Signed)
Stop Meloxicam. Stop Aspirin.  Start Dexilant 60mg  daily.  Follow up in 2 weeks or sooner as needed.

## 2012-11-30 NOTE — Progress Notes (Signed)
Subjective:    Patient ID: Alexandra Bishop, female    DOB: 1945-07-28, 67 y.o.   MRN: [id number]  HPI 67YO female with h/o OA on chronic meloxicam presents for acute visit complaining of several days of epigastric abdominal pain. Pain described as burning. Pt denies nausea, vomiting, change on bowel habits, blood in stool. Pt has been taking Tylenol 1000mg  qid with some improvement in symptoms. No fever, chills, or other symptoms.  Also notes recent increase in symptoms of itchy rash over her scalp. Using Delsyn shampoo with minimal improvement. Has follow up scheduled with dermatologist.  Outpatient Encounter Prescriptions as of 11/30/2012  Medication Sig Dispense Refill  . amLODipine (NORVASC) 10 MG tablet TAKE 1 TABLET BY MOUTH EVERY DAY  90 tablet  1  . aspirin 81 MG EC tablet Take 81 mg by mouth daily.        . calcium carbonate 200 MG capsule Take 250 mg by mouth once a week.       . levothyroxine (SYNTHROID, LEVOTHROID) 100 MCG tablet TAKE 1 TABLET BY MOUTH EVERY DAY  90 tablet  1  . Multiple Vitamin (MULTIVITAMIN) tablet Take 1 tablet by mouth every other day.       . triamcinolone cream (KENALOG) 0.1 % Apply topically 2 (two) times daily. For irritated, itchy skin  45 g  1  . zolpidem (AMBIEN) 5 MG tablet TAKE 1 TABLETS BY MOUTH AT BEDTIME AS NEEDED  30 tablet  3  . [DISCONTINUED] meloxicam (MOBIC) 15 MG tablet TAKE 1 TABLET BY MOUTH EVERY DAY  30 tablet  3  . dexlansoprazole (DEXILANT) 60 MG capsule Take 1 capsule (60 mg total) by mouth daily.  30 capsule  0  . doxycycline (VIBRAMYCIN) 50 MG capsule TAKE ONE CAPSULE BY MOUTH EVERY DAY  90 capsule  0  . vitamin D, CHOLECALCIFEROL, 400 UNITS tablet Take 400 Units by mouth daily.       . [DISCONTINUED] fish oil-omega-3 fatty acids 1000 MG capsule Take 1 g by mouth every other day.       No facility-administered encounter medications on file as of 11/30/2012.   BP 138/70  Pulse 60  Temp(Src) 98 F (36.7 C) (Oral)  Wt 204 lb (92.534 kg)   BMI 32.94 kg/m2  SpO2 98%  Review of Systems  Constitutional: Negative for fever, chills, appetite change, fatigue and unexpected weight change.  HENT: Negative for ear pain, congestion, sore throat, trouble swallowing, neck pain, voice change and sinus pressure.   Eyes: Negative for visual disturbance.  Respiratory: Negative for cough, shortness of breath, wheezing and stridor.   Cardiovascular: Negative for chest pain, palpitations and leg swelling.  Gastrointestinal: Positive for abdominal pain. Negative for nausea, vomiting, diarrhea, constipation, blood in stool, abdominal distention and anal bleeding.  Genitourinary: Negative for dysuria and flank pain.  Musculoskeletal: Negative for myalgias, arthralgias and gait problem.  Skin: Negative for color change and rash.  Neurological: Negative for dizziness and headaches.  Hematological: Negative for adenopathy. Does not bruise/bleed easily.  Psychiatric/Behavioral: Negative for suicidal ideas, sleep disturbance and dysphoric mood. The patient is not nervous/anxious.        Objective:   Physical Exam  Constitutional: She is oriented to person, place, and time. She appears well-developed and well-nourished. No distress.  HENT:  Head: Normocephalic and atraumatic.  Right Ear: External ear normal.  Left Ear: External ear normal.  Nose: Nose normal.  Mouth/Throat: Oropharynx is clear and moist. No oropharyngeal exudate.  Eyes: Conjunctivae are normal.  Pupils are equal, round, and reactive to light. Right eye exhibits no discharge. Left eye exhibits no discharge. No scleral icterus.  Neck: Normal range of motion. Neck supple. No tracheal deviation present. No thyromegaly present.  Cardiovascular: Normal rate, regular rhythm, normal heart sounds and intact distal pulses.  Exam reveals no gallop and no friction rub.   No murmur heard. Pulmonary/Chest: Effort normal and breath sounds normal. No accessory muscle usage. Not tachypneic. No  respiratory distress. She has no decreased breath sounds. She has no wheezes. She has no rhonchi. She has no rales. She exhibits no tenderness.  Abdominal: Soft. Bowel sounds are normal. She exhibits no distension. There is tenderness (mild epigastric diffuse). There is no rebound.  Musculoskeletal: Normal range of motion. She exhibits no edema and no tenderness.  Lymphadenopathy:    She has no cervical adenopathy.  Neurological: She is alert and oriented to person, place, and time. No cranial nerve deficit. She exhibits normal muscle tone. Coordination normal.  Skin: Skin is warm and dry. No rash noted. She is not diaphoretic. No erythema. No pallor.  Psychiatric: She has a normal mood and affect. Her behavior is normal. Judgment and thought content normal.          Assessment & Plan:

## 2012-11-30 NOTE — Assessment & Plan Note (Signed)
Symptoms are most consistent with gastritis versus ulcer. Will have patient stop meloxicam and aspirin. Will start Dexilant 60mg  daily. Follow up in 2 weeks. If no improvement, will check for H. Pylori and refer for endoscopy.

## 2012-11-30 NOTE — Assessment & Plan Note (Signed)
Recent flare of psoriasis in scalp. Will continue Delsym shampoo and prn triamcinolone. Discussed oral prednisone and PUVA, but will hold off until derm evaluation complete.

## 2012-12-01 ENCOUNTER — Ambulatory Visit: Payer: Self-pay | Admitting: Hematology and Oncology

## 2012-12-01 LAB — CBC CANCER CENTER
Basophil #: 0.1 x10 3/mm (ref 0.0–0.1)
Basophil %: 0.7 %
Eosinophil #: 0.2 x10 3/mm (ref 0.0–0.7)
Eosinophil %: 1.9 %
HCT: 45.3 % (ref 35.0–47.0)
HGB: 14.8 g/dL (ref 12.0–16.0)
Lymphocyte #: 1.2 x10 3/mm (ref 1.0–3.6)
Lymphocyte %: 11.4 %
MCH: 27.5 pg (ref 26.0–34.0)
MCHC: 32.8 g/dL (ref 32.0–36.0)
MCV: 84 fL (ref 80–100)
Monocyte #: 0.7 x10 3/mm (ref 0.2–0.9)
Monocyte %: 6.9 %
Neutrophil #: 8 x10 3/mm — ABNORMAL HIGH (ref 1.4–6.5)
Neutrophil %: 79.1 %
Platelet: 238 x10 3/mm (ref 150–440)
RBC: 5.4 10*6/uL — ABNORMAL HIGH (ref 3.80–5.20)
RDW: 12.9 % (ref 11.5–14.5)
WBC: 10.1 x10 3/mm (ref 3.6–11.0)

## 2012-12-06 ENCOUNTER — Telehealth: Payer: Self-pay | Admitting: Internal Medicine

## 2012-12-06 NOTE — Telephone Encounter (Signed)
Patient informed and she is fine taking the Tylenol. She has never taken Lyrica but will discuss this further when she comes in for her visit on Friday with you.

## 2012-12-06 NOTE — Telephone Encounter (Signed)
It is fine for her to take the Tylenol. I see that she is allergic to both Tramadol and Hydrocodone. Has she ever taken Lyrica?

## 2012-12-06 NOTE — Telephone Encounter (Signed)
She was taken off the Meloxicam and she is having a lot of pain. Every so often the hip pain will began to kick in and would like to know what she could do. The hip pain was the reason she was given Meloxicam to begin with, has been taking Extra strength Tylenol. Would it be ok for her to take the Tylenol but need something for the pain.

## 2012-12-06 NOTE — Telephone Encounter (Signed)
Medicine for gastritis is helping, but now pt is off pain medication so is having pain.  Going out this weekend so would like to get a call regarding this.

## 2012-12-07 ENCOUNTER — Encounter: Payer: Self-pay | Admitting: *Deleted

## 2012-12-08 ENCOUNTER — Ambulatory Visit (INDEPENDENT_AMBULATORY_CARE_PROVIDER_SITE_OTHER): Payer: Medicare Other | Admitting: Internal Medicine

## 2012-12-08 ENCOUNTER — Encounter: Payer: Self-pay | Admitting: Internal Medicine

## 2012-12-08 VITALS — BP 112/78 | HR 69 | Temp 98.3°F | Ht 65.5 in | Wt 201.0 lb

## 2012-12-08 DIAGNOSIS — R1013 Epigastric pain: Secondary | ICD-10-CM

## 2012-12-08 DIAGNOSIS — M79609 Pain in unspecified limb: Secondary | ICD-10-CM

## 2012-12-08 DIAGNOSIS — M79621 Pain in right upper arm: Secondary | ICD-10-CM

## 2012-12-08 DIAGNOSIS — Z0000 Encounter for general adult medical examination without abnormal findings: Secondary | ICD-10-CM

## 2012-12-08 MED ORDER — AMLODIPINE BESYLATE 10 MG PO TABS: ORAL_TABLET | ORAL | Status: DC

## 2012-12-08 MED ORDER — LEVOTHYROXINE SODIUM 100 MCG PO TABS: ORAL_TABLET | ORAL | Status: DC

## 2012-12-08 NOTE — Progress Notes (Signed)
Subjective:    Patient ID: Alexandra Bishop, female    DOB: 04-Mar-1946, 67 y.o.   MRN: [id number]  HPI The patient is here for annual Medicare wellness examination and management of other chronic and acute problems.   The risk factors are reflected in the social history.  The roster of all physicians providing medical care to patient - is listed in the Snapshot section of the chart.  Activities of daily living:  The patient is 100% independent in all ADLs: dressing, toileting, feeding as well as independent mobility  Home safety : The patient has smoke detectors in the home. They wear seatbelts.  There are no firearms at home. There is no violence in the home.   There is no risks for hepatitis, STDs or HIV. There is no history of blood transfusion. They have no travel history to infectious disease endemic areas of the world.  The patient has seen their dentist in the last six month. [facility]ist - [facility] They have seen their eye doctor in the last year. Opthalmology - [facility] No issues with hearing.  They have deferred audiologic testing in the last year.   They do not  have excessive sun exposure. Discussed the need for sun protection: hats, long sleeves and use of sunscreen if there is significant sun exposure. Dermatologist - Dr. Cheree Ditto Podiatrist - Dr. Al Corp[facility] - Dr. Gilda Crease  Diet: the importance of a healthy diet is discussed. They do have a healthy diet.  The benefits of regular aerobic exercise were discussed. She generally walks and goes to the [facility] with [employer].  Depression screen: there are no signs or vegative symptoms of depression- irritability, change in appetite, anhedonia, sadness/tearfullness.  Cognitive assessment: the patient manages all their financial and personal affairs and is actively engaged. They could relate day,date,year and events.  The following portions of the patient's history were reviewed and updated as appropriate: allergies, current  medications, past family history, past medical history,  past surgical history, past social history  and problem list.  Visual acuity was not assessed per patient preference since she has regular follow up with her ophthalmologist. Hearing and body mass index were assessed and reviewed.   During the course of the visit the patient was educated and counseled about appropriate screening and preventive services including : fall prevention , diabetes screening, nutrition counseling, colorectal cancer screening, and recommended immunizations.    Recent symptoms of epigastric discomfort have resolved after stopping Meloxicam and starting Dexilant.   Outpatient Encounter Prescriptions as of 12/08/2012  Medication Sig Dispense Refill  . amLODipine (NORVASC) 10 MG tablet TAKE 1 TABLET BY MOUTH EVERY DAY  90 tablet  3  . CALCIUM & MAGNESIUM CARBONATES PO Take by mouth once a week.       Marland Kitchen dexlansoprazole (DEXILANT) 60 MG capsule Take 1 capsule (60 mg total) by mouth daily.  30 capsule  0  . doxycycline (VIBRAMYCIN) 50 MG capsule TAKE ONE CAPSULE BY MOUTH EVERY DAY  90 capsule  0  . levothyroxine (SYNTHROID, LEVOTHROID) 100 MCG tablet TAKE 1 TABLET BY MOUTH EVERY DAY  90 tablet  3  . Multiple Vitamin (MULTIVITAMIN) tablet Take 1 tablet by mouth every other day.       . triamcinolone cream (KENALOG) 0.1 % Apply topically 2 (two) times daily. For irritated, itchy skin  45 g  1  . zolpidem (AMBIEN) 5 MG tablet TAKE 1 TABLETS BY MOUTH AT BEDTIME AS NEEDED  30 tablet  3  .  aspirin 81 MG EC tablet Take 81 mg by mouth daily.        . calcium carbonate 200 MG capsule Take 250 mg by mouth once a week.       . vitamin D, CHOLECALCIFEROL, 400 UNITS tablet Take 400 Units by mouth daily.        No facility-administered encounter medications on file as of 12/08/2012.   BP 112/78  Pulse 69  Temp(Src) 98.3 F (36.8 C) (Oral)  Ht 5' 5.5" (1.664 m)  Wt 201 lb (91.173 kg)  BMI 32.93 kg/m2  SpO2 96%  Review of  Systems  Constitutional: Negative for fever, chills, appetite change, fatigue and unexpected weight change.  HENT: Negative for ear pain, congestion, sore throat, trouble swallowing, neck pain, voice change and sinus pressure.   Eyes: Negative for visual disturbance.  Respiratory: Negative for cough, shortness of breath, wheezing and stridor.   Cardiovascular: Negative for chest pain, palpitations and leg swelling.  Gastrointestinal: Negative for nausea, vomiting, abdominal pain, diarrhea, constipation, blood in stool, abdominal distention and anal bleeding.  Genitourinary: Negative for dysuria and flank pain.  Musculoskeletal: Positive for arthralgias. Negative for myalgias and gait problem.  Skin: Negative for color change and rash.  Neurological: Negative for dizziness and headaches.  Hematological: Negative for adenopathy. Does not bruise/bleed easily.  Psychiatric/Behavioral: Negative for suicidal ideas, sleep disturbance and dysphoric mood. The patient is not nervous/anxious.        Objective:   Physical Exam  Constitutional: She is oriented to person, place, and time. She appears well-developed and well-nourished. No distress.  HENT:  Head: Normocephalic and atraumatic.  Right Ear: External ear normal.  Left Ear: External ear normal.  Nose: Nose normal.  Mouth/Throat: Oropharynx is clear and moist. No oropharyngeal exudate.  Eyes: Conjunctivae are normal. Pupils are equal, round, and reactive to light. Right eye exhibits no discharge. Left eye exhibits no discharge. No scleral icterus.  Neck: Normal range of motion. Neck supple. No tracheal deviation present. No thyromegaly present.  Cardiovascular: Normal rate, regular rhythm, normal heart sounds and intact distal pulses.  Exam reveals no gallop and no friction rub.   No murmur heard. Pulmonary/Chest: Effort normal and breath sounds normal. No accessory muscle usage. Not tachypneic. No respiratory distress. She has no decreased  breath sounds. She has no wheezes. She has no rhonchi. She has no rales. She exhibits no tenderness. Right breast exhibits no inverted nipple, no mass, no nipple discharge, no skin change and no tenderness. Left breast exhibits no inverted nipple, no mass, no nipple discharge, no skin change and no tenderness. Breasts are symmetrical.  Abdominal: Soft. Bowel sounds are normal. She exhibits no distension and no mass. There is no tenderness. There is no rebound and no guarding.  Musculoskeletal: Normal range of motion. She exhibits no edema and no tenderness.  Lymphadenopathy:    She has no cervical adenopathy.  Neurological: She is alert and oriented to person, place, and time. No cranial nerve deficit. She exhibits normal muscle tone. Coordination normal.  Skin: Skin is warm and dry. No rash noted. She is not diaphoretic. No erythema. No pallor.  Psychiatric: She has a normal mood and affect. Her behavior is normal. Judgment and thought content normal.          Assessment & Plan:

## 2012-12-08 NOTE — Assessment & Plan Note (Addendum)
Chronic arthritis pain right shoulder, recently worsened with stopping Meloxicam. Discussed starting Lyrica, however, will hold off until pt returns from trip this week.

## 2012-12-08 NOTE — Assessment & Plan Note (Signed)
General medical exam normal today including breast exam. Pelvic and PAP deferred given pt age and preference, as well as h/o all normal PAPs. Health maintenance is UTD except for Flu vaccine which pt prefers to defer until later this month. Will plan to check fasting lipids at next visit. Encouraged healthy diet and regular physical activity. Follow up 1 month.

## 2012-12-08 NOTE — Assessment & Plan Note (Signed)
Symptoms resolved after stopping Meloxicam and starting Dexilant. Will continue to monitor.

## 2012-12-14 ENCOUNTER — Ambulatory Visit: Payer: Medicare Other | Admitting: Internal Medicine

## 2012-12-17 ENCOUNTER — Other Ambulatory Visit: Payer: Self-pay | Admitting: Internal Medicine

## 2012-12-20 ENCOUNTER — Ambulatory Visit: Payer: Self-pay | Admitting: Hematology and Oncology

## 2013-01-04 ENCOUNTER — Other Ambulatory Visit: Payer: Self-pay | Admitting: Internal Medicine

## 2013-01-04 NOTE — Telephone Encounter (Signed)
Okay to refill? 

## 2013-01-09 ENCOUNTER — Encounter: Payer: Self-pay | Admitting: Internal Medicine

## 2013-01-09 ENCOUNTER — Ambulatory Visit (INDEPENDENT_AMBULATORY_CARE_PROVIDER_SITE_OTHER): Payer: Medicare Other | Admitting: Internal Medicine

## 2013-01-09 VITALS — BP 130/80 | HR 67 | Temp 98.2°F | Wt 204.0 lb

## 2013-01-09 DIAGNOSIS — E785 Hyperlipidemia, unspecified: Secondary | ICD-10-CM

## 2013-01-09 DIAGNOSIS — G8929 Other chronic pain: Secondary | ICD-10-CM

## 2013-01-09 DIAGNOSIS — E039 Hypothyroidism, unspecified: Secondary | ICD-10-CM

## 2013-01-09 DIAGNOSIS — I10 Essential (primary) hypertension: Secondary | ICD-10-CM

## 2013-01-09 DIAGNOSIS — E559 Vitamin D deficiency, unspecified: Secondary | ICD-10-CM

## 2013-01-09 DIAGNOSIS — E669 Obesity, unspecified: Secondary | ICD-10-CM

## 2013-01-09 LAB — LIPID PANEL
Cholesterol: 195 mg/dL (ref 0–200)
HDL: 47.8 mg/dL (ref >39.00)
Total CHOL/HDL Ratio: 4
Triglycerides: 224 mg/dL — ABNORMAL HIGH (ref 0.0–149.0)
VLDL: 44.8 mg/dL — ABNORMAL HIGH (ref 0.0–40.0)

## 2013-01-09 LAB — COMPREHENSIVE METABOLIC PANEL
ALT: 32 U/L (ref 0–35)
AST: 30 U/L (ref 0–37)
Albumin: 4.3 g/dL (ref 3.5–5.2)
Alkaline Phosphatase: 86 U/L (ref 39–117)
BUN: 9 mg/dL (ref 6–23)
CO2: 26 mEq/L (ref 19–32)
Calcium: 9.2 mg/dL (ref 8.4–10.5)
Chloride: 104 mEq/L (ref 96–112)
Creatinine, Ser: 0.7 mg/dL (ref 0.4–1.2)
GFR: 83.91 mL/min (ref >60.00)
Glucose, Bld: 92 mg/dL (ref 70–99)
Potassium: 3.8 mEq/L (ref 3.5–5.1)
Sodium: 140 mEq/L (ref 135–145)
Total Bilirubin: 0.9 mg/dL (ref 0.3–1.2)
Total Protein: 7.1 g/dL (ref 6.0–8.3)

## 2013-01-09 LAB — TSH: TSH: 1.05 u[IU]/mL (ref 0.35–5.50)

## 2013-01-09 LAB — LDL CHOLESTEROL, DIRECT: Direct LDL: 106.4 mg/dL

## 2013-01-09 MED ORDER — GABAPENTIN 100 MG PO CAPS: 100.0000 mg | ORAL_CAPSULE | Freq: Three times a day (TID) | ORAL | Status: DC

## 2013-01-09 NOTE — Progress Notes (Signed)
Subjective:    Patient ID: Alexandra Bishop, female    DOB: Feb 05, 1946, 67 y.o.   MRN: [id number]  HPI 67 year old female with history of arthralgia, hypertension, hypothyroidism presents for followup. She reports that she is generally feeling well. She continues to have some intermittent aching pain in her right shoulder and right hip. This is exacerbated by lying on her right side at night. In the past, she took meloxicam for this however she had to stop meloxicam because of gastritis. She also tried tramadol however had an allergic reaction to tramadol. She is not currently taking anything for pain.  She is also concerned about her weight. She is interested in starting a "Paleo" style diet. She notes her physical activity has been limited because of chronic right shoulder and hip pain.  Outpatient Encounter Prescriptions as of 01/09/2013  Medication Sig Dispense Refill  . amLODipine (NORVASC) 10 MG tablet TAKE 1 TABLET BY MOUTH EVERY DAY  90 tablet  3  . CALCIUM & MAGNESIUM CARBONATES PO Take by mouth once a week.       . doxycycline (VIBRAMYCIN) 50 MG capsule TAKE ONE CAPSULE BY MOUTH EVERY DAY  90 capsule  0  . levothyroxine (SYNTHROID, LEVOTHROID) 100 MCG tablet TAKE 1 TABLET BY MOUTH EVERY DAY  90 tablet  3  . Multiple Vitamin (MULTIVITAMIN) tablet Take 1 tablet by mouth every other day.       . zolpidem (AMBIEN) 5 MG tablet TAKE 1 TABLET BY MOUTH AT BEDTIME AS NEEDED  30 tablet  2  . aspirin 81 MG EC tablet Take 81 mg by mouth daily.        . calcium carbonate 200 MG capsule Take 250 mg by mouth once a week.       . clobetasol (TEMOVATE) 0.05 % external solution       . dexlansoprazole (DEXILANT) 60 MG capsule Take 1 capsule (60 mg total) by mouth daily.  30 capsule  0  . gabapentin (NEURONTIN) 100 MG capsule Take 1 capsule (100 mg total) by mouth 3 (three) times daily.  90 capsule  3  . triamcinolone cream (KENALOG) 0.1 % Apply topically 2 (two) times daily. For irritated, itchy skin  45 g   1  . vitamin D, CHOLECALCIFEROL, 400 UNITS tablet Take 400 Units by mouth daily.       . [DISCONTINUED] AFLURIA PRESERVATIVE FREE injection        No facility-administered encounter medications on file as of 01/09/2013.   BP 130/80  Pulse 67  Temp(Src) 98.2 F (36.8 C) (Oral)  Wt 204 lb (92.534 kg)  BMI 33.42 kg/m2  SpO2 96%  Review of Systems  Constitutional: Negative for fever, chills, appetite change, fatigue and unexpected weight change.  HENT: Negative for congestion, ear pain, sinus pressure, sore throat, trouble swallowing and voice change.   Eyes: Negative for visual disturbance.  Respiratory: Negative for cough, shortness of breath, wheezing and stridor.   Cardiovascular: Negative for chest pain, palpitations and leg swelling.  Gastrointestinal: Negative for nausea, vomiting, abdominal pain, diarrhea, constipation, blood in stool, abdominal distention and anal bleeding.  Genitourinary: Negative for dysuria and flank pain.  Musculoskeletal: Positive for arthralgias (right shoulder and right hip) and myalgias. Negative for gait problem and neck pain.  Skin: Negative for color change and rash.  Neurological: Negative for dizziness and headaches.  Hematological: Negative for adenopathy. Does not bruise/bleed easily.  Psychiatric/Behavioral: Negative for suicidal ideas, sleep disturbance and dysphoric mood. The patient is not nervous/anxious.  Objective:   Physical Exam  Constitutional: She is oriented to person, place, and time. She appears well-developed and well-nourished. No distress.  HENT:  Head: Normocephalic and atraumatic.  Right Ear: External ear normal.  Left Ear: External ear normal.  Nose: Nose normal.  Mouth/Throat: Oropharynx is clear and moist. No oropharyngeal exudate.  Eyes: Conjunctivae are normal. Pupils are equal, round, and reactive to light. Right eye exhibits no discharge. Left eye exhibits no discharge. No scleral icterus.  Neck: Normal  range of motion. Neck supple. No tracheal deviation present. No thyromegaly present.  Cardiovascular: Normal rate, regular rhythm, normal heart sounds and intact distal pulses.  Exam reveals no gallop and no friction rub.   No murmur heard. Pulmonary/Chest: Effort normal and breath sounds normal. No accessory muscle usage. Not tachypneic. No respiratory distress. She has no decreased breath sounds. She has no wheezes. She has no rhonchi. She has no rales. She exhibits no tenderness.  Musculoskeletal: Normal range of motion. She exhibits no edema and no tenderness.       Right shoulder: She exhibits pain. She exhibits normal range of motion and no tenderness.  Lymphadenopathy:    She has no cervical adenopathy.  Neurological: She is alert and oriented to person, place, and time. No cranial nerve deficit. She exhibits normal muscle tone. Coordination normal.  Skin: Skin is warm and dry. No rash noted. She is not diaphoretic. No erythema. No pallor.  Psychiatric: She has a normal mood and affect. Her behavior is normal. Judgment and thought content normal.          Assessment & Plan:

## 2013-01-09 NOTE — Assessment & Plan Note (Signed)
BP Readings from Last 3 Encounters:  01/09/13 130/80  12/08/12 112/78  11/30/12 138/70   BP well controlled on current medication. Will check renal function with labs today.

## 2013-01-09 NOTE — Assessment & Plan Note (Addendum)
Chronic pain in right shoulder and right hip from osteoarthritis. Unable to take NSAIDS because of gastritis. Allergic to Tramadol and Hydrocodone. Will start Neurontin 100mg  po tid. Will refer for PT to see if any improvement. Follow up 4 weeks and prn. If no improvement, consider referral to sports medicine.

## 2013-01-09 NOTE — Assessment & Plan Note (Addendum)
Will check lipids and LFTs with labs today. Encouraged Mediterranean style diet and regular exercise.

## 2013-01-09 NOTE — Assessment & Plan Note (Signed)
Will check TSH with labs today. 

## 2013-01-09 NOTE — Assessment & Plan Note (Signed)
Wt Readings from Last 3 Encounters:  01/09/13 204 lb (92.534 kg)  12/08/12 201 lb (91.173 kg)  11/30/12 204 lb (92.534 kg)   Encouraged her to limit caloric intake. Discussed Mediterranean style diet.

## 2013-01-10 LAB — VITAMIN D 25 HYDROXY (VIT D DEFICIENCY, FRACTURES): Vit D, 25-Hydroxy: 46 ng/mL (ref 30–89)

## 2013-01-23 NOTE — Telephone Encounter (Signed)
error 

## 2013-02-05 ENCOUNTER — Encounter: Payer: Self-pay | Admitting: *Deleted

## 2013-02-06 ENCOUNTER — Ambulatory Visit (INDEPENDENT_AMBULATORY_CARE_PROVIDER_SITE_OTHER): Payer: Medicare Other | Admitting: Internal Medicine

## 2013-02-06 ENCOUNTER — Encounter: Payer: Self-pay | Admitting: Internal Medicine

## 2013-02-06 VITALS — BP 122/78 | HR 60 | Temp 98.4°F | Wt 207.0 lb

## 2013-02-06 DIAGNOSIS — G8929 Other chronic pain: Secondary | ICD-10-CM

## 2013-02-06 NOTE — Progress Notes (Signed)
Pre-visit discussion using our clinic review tool. No additional management support is needed unless otherwise documented below in the visit note.  

## 2013-02-06 NOTE — Assessment & Plan Note (Signed)
Symptoms of chronic pain in the right shoulder and right hip from osteoarthritis are improved with use of Neurontin. Note that patient is unable to take and states because of history of gastritis. She is allergic to tramadol and hydrocodone. She is tolerating Neurontin well with no side effects. Will continue medication. She will continue with physical therapy. Plan to followup in 3 months or sooner as needed.

## 2013-02-06 NOTE — Progress Notes (Signed)
Subjective:    Patient ID: Alexandra Bishop, female    DOB: 1945/05/17, 67 y.o.   MRN: [id number]  HPI 67 year old female with history of hypertension, hypothyroidism, and chronic pain in her right shoulder and right hip presents for followup. At her last visit, she was started on Neurontin for better pain control. She reports some improvement with this. She is taking Neurontin 100 mg 3 times daily. She was also referred for physical therapy. She has had 3 sessions so far. She reports some improvement with this. However, cost has been limiting for her. She denies any pain at present.  Outpatient Encounter Prescriptions as of 02/06/2013  Medication Sig  . amLODipine (NORVASC) 10 MG tablet TAKE 1 TABLET BY MOUTH EVERY DAY  . aspirin 81 MG EC tablet Take 81 mg by mouth every other day.   Marland Kitchen CALCIUM & MAGNESIUM CARBONATES PO Take by mouth once a week.   Marland Kitchen dexlansoprazole (DEXILANT) 60 MG capsule Take 1 capsule (60 mg total) by mouth daily.  Marland Kitchen doxycycline (VIBRAMYCIN) 50 MG capsule TAKE ONE CAPSULE BY MOUTH EVERY DAY  . gabapentin (NEURONTIN) 100 MG capsule Take 1 capsule (100 mg total) by mouth 3 (three) times daily.  Marland Kitchen levothyroxine (SYNTHROID, LEVOTHROID) 100 MCG tablet TAKE 1 TABLET BY MOUTH EVERY DAY  . Multiple Vitamin (MULTIVITAMIN) tablet Take 1 tablet by mouth every other day.   . zolpidem (AMBIEN) 5 MG tablet TAKE 1 TABLET BY MOUTH AT BEDTIME AS NEEDED   BP 122/78  Pulse 60  Temp(Src) 98.4 F (36.9 C) (Oral)  Wt 207 lb (93.895 kg)  SpO2 98%  Review of Systems  Constitutional: Negative for fever, chills, appetite change, fatigue and unexpected weight change.  HENT: Negative for congestion, ear pain, sinus pressure, sore throat, trouble swallowing and voice change.   Eyes: Negative for visual disturbance.  Respiratory: Negative for cough, shortness of breath, wheezing and stridor.   Cardiovascular: Negative for chest pain, palpitations and leg swelling.  Gastrointestinal: Negative for  nausea, vomiting, abdominal pain, diarrhea, constipation, blood in stool, abdominal distention and anal bleeding.  Genitourinary: Negative for dysuria and flank pain.  Musculoskeletal: Negative for arthralgias, gait problem, myalgias and neck pain.  Skin: Negative for color change and rash.  Neurological: Negative for dizziness and headaches.  Hematological: Negative for adenopathy. Does not bruise/bleed easily.  Psychiatric/Behavioral: Negative for suicidal ideas, sleep disturbance and dysphoric mood. The patient is not nervous/anxious.        Objective:   Physical Exam  Constitutional: She is oriented to person, place, and time. She appears well-developed and well-nourished. No distress.  HENT:  Head: Normocephalic and atraumatic.  Right Ear: External ear normal.  Left Ear: External ear normal.  Nose: Nose normal.  Mouth/Throat: Oropharynx is clear and moist. No oropharyngeal exudate.  Eyes: Conjunctivae are normal. Pupils are equal, round, and reactive to light. Right eye exhibits no discharge. Left eye exhibits no discharge. No scleral icterus.  Neck: Normal range of motion. Neck supple. No tracheal deviation present. No thyromegaly present.  Cardiovascular: Normal rate, regular rhythm, normal heart sounds and intact distal pulses.  Exam reveals no gallop and no friction rub.   No murmur heard. Pulmonary/Chest: Effort normal and breath sounds normal. No accessory muscle usage. Not tachypneic. No respiratory distress. She has no decreased breath sounds. She has no wheezes. She has no rhonchi. She has no rales. She exhibits no tenderness.  Musculoskeletal: Normal range of motion. She exhibits no edema and no tenderness.  Lymphadenopathy:  She has no cervical adenopathy.  Neurological: She is alert and oriented to person, place, and time. No cranial nerve deficit. She exhibits normal muscle tone. Coordination normal.  Skin: Skin is warm and dry. No rash noted. She is not diaphoretic.  No erythema. No pallor.  Psychiatric: She has a normal mood and affect. Her behavior is normal. Judgment and thought content normal.          Assessment & Plan:

## 2013-02-11 ENCOUNTER — Other Ambulatory Visit: Payer: Self-pay | Admitting: Internal Medicine

## 2013-02-12 NOTE — Telephone Encounter (Signed)
Ok to refill 

## 2013-02-14 ENCOUNTER — Other Ambulatory Visit: Payer: Self-pay | Admitting: Internal Medicine

## 2013-03-22 DIAGNOSIS — M549 Dorsalgia, unspecified: Secondary | ICD-10-CM | POA: Diagnosis not present

## 2013-03-22 HISTORY — PX: BUNIONECTOMY: SHX129

## 2013-04-04 ENCOUNTER — Other Ambulatory Visit: Payer: Self-pay | Admitting: Internal Medicine

## 2013-04-04 DIAGNOSIS — L570 Actinic keratosis: Secondary | ICD-10-CM | POA: Diagnosis not present

## 2013-04-04 DIAGNOSIS — L739 Follicular disorder, unspecified: Secondary | ICD-10-CM | POA: Diagnosis not present

## 2013-04-04 DIAGNOSIS — L408 Other psoriasis: Secondary | ICD-10-CM | POA: Diagnosis not present

## 2013-04-04 DIAGNOSIS — L821 Other seborrheic keratosis: Secondary | ICD-10-CM | POA: Diagnosis not present

## 2013-04-04 DIAGNOSIS — Z1283 Encounter for screening for malignant neoplasm of skin: Secondary | ICD-10-CM | POA: Diagnosis not present

## 2013-04-04 NOTE — Telephone Encounter (Signed)
Rx phoned to pharmacy.  

## 2013-04-04 NOTE — Telephone Encounter (Signed)
Ok refill? 

## 2013-04-22 DIAGNOSIS — M549 Dorsalgia, unspecified: Secondary | ICD-10-CM | POA: Diagnosis not present

## 2013-04-29 ENCOUNTER — Other Ambulatory Visit: Payer: Self-pay | Admitting: Internal Medicine

## 2013-05-10 ENCOUNTER — Ambulatory Visit (INDEPENDENT_AMBULATORY_CARE_PROVIDER_SITE_OTHER): Payer: Medicare Other | Admitting: Internal Medicine

## 2013-05-10 ENCOUNTER — Encounter: Payer: Self-pay | Admitting: Internal Medicine

## 2013-05-10 VITALS — BP 140/62 | HR 59 | Temp 97.7°F | Wt 203.0 lb

## 2013-05-10 DIAGNOSIS — G8929 Other chronic pain: Secondary | ICD-10-CM

## 2013-05-10 DIAGNOSIS — E669 Obesity, unspecified: Secondary | ICD-10-CM

## 2013-05-10 DIAGNOSIS — D751 Secondary polycythemia: Secondary | ICD-10-CM

## 2013-05-10 DIAGNOSIS — E039 Hypothyroidism, unspecified: Secondary | ICD-10-CM | POA: Diagnosis not present

## 2013-05-10 DIAGNOSIS — E785 Hyperlipidemia, unspecified: Secondary | ICD-10-CM | POA: Diagnosis not present

## 2013-05-10 DIAGNOSIS — I10 Essential (primary) hypertension: Secondary | ICD-10-CM

## 2013-05-10 LAB — COMPREHENSIVE METABOLIC PANEL
ALT: 33 U/L (ref 0–35)
AST: 33 U/L (ref 0–37)
Albumin: 4.3 g/dL (ref 3.5–5.2)
Alkaline Phosphatase: 78 U/L (ref 39–117)
BUN: 14 mg/dL (ref 6–23)
CO2: 24 mEq/L (ref 19–32)
Calcium: 9.6 mg/dL (ref 8.4–10.5)
Chloride: 107 mEq/L (ref 96–112)
Creatinine, Ser: 0.8 mg/dL (ref 0.4–1.2)
GFR: 74.35 mL/min (ref >60.00)
Glucose, Bld: 96 mg/dL (ref 70–99)
Potassium: 4.6 mEq/L (ref 3.5–5.1)
Sodium: 139 mEq/L (ref 135–145)
Total Bilirubin: 0.7 mg/dL (ref 0.3–1.2)
Total Protein: 6.9 g/dL (ref 6.0–8.3)

## 2013-05-10 LAB — CBC WITH DIFFERENTIAL/PLATELET
Basophils Absolute: 0 10*3/uL (ref 0.0–0.1)
Basophils Relative: 0.6 % (ref 0.0–3.0)
Eosinophils Absolute: 0.2 10*3/uL (ref 0.0–0.7)
Eosinophils Relative: 3.2 % (ref 0.0–5.0)
HCT: 48.9 % — ABNORMAL HIGH (ref 36.0–46.0)
Hemoglobin: 15.7 g/dL — ABNORMAL HIGH (ref 12.0–15.0)
Lymphocytes Relative: 20.1 % (ref 12.0–46.0)
Lymphs Abs: 1.3 10*3/uL (ref 0.7–4.0)
MCHC: 32.2 g/dL (ref 30.0–36.0)
MCV: 85.1 fl (ref 78.0–100.0)
Monocytes Absolute: 0.4 10*3/uL (ref 0.1–1.0)
Monocytes Relative: 6.9 % (ref 3.0–12.0)
Neutro Abs: 4.4 10*3/uL (ref 1.4–7.7)
Neutrophils Relative %: 69.2 % (ref 43.0–77.0)
Platelets: 264 10*3/uL (ref 150.0–400.0)
RBC: 5.75 Mil/uL — ABNORMAL HIGH (ref 3.87–5.11)
RDW: 13.1 % (ref 11.5–14.6)
WBC: 6.4 10*3/uL (ref 4.5–10.5)

## 2013-05-10 LAB — LIPID PANEL
Cholesterol: 195 mg/dL (ref 0–200)
HDL: 54.1 mg/dL (ref >39.00)
LDL Cholesterol: 114 mg/dL — ABNORMAL HIGH (ref 0–99)
Total CHOL/HDL Ratio: 4
Triglycerides: 134 mg/dL (ref 0.0–149.0)
VLDL: 26.8 mg/dL (ref 0.0–40.0)

## 2013-05-10 LAB — TSH: TSH: 1.29 u[IU]/mL (ref 0.35–5.50)

## 2013-05-10 NOTE — Progress Notes (Signed)
Pre-visit discussion using our clinic review tool. No additional management support is needed unless otherwise documented below in the visit note.  

## 2013-05-10 NOTE — Assessment & Plan Note (Signed)
Wt Readings from Last 3 Encounters:  05/10/13 203 lb (92.08 kg)  02/06/13 207 lb (93.895 kg)  01/09/13 204 lb (92.534 kg)   Congratulated pt on weight loss. Discussed supportive groups such as [facility]. Encouraged continued effort at [employer] and regular exercise.

## 2013-05-10 NOTE — Assessment & Plan Note (Signed)
Symptomatically doing well. Will check TSH with labs today.

## 2013-05-10 NOTE — Assessment & Plan Note (Signed)
Right hip and shoulder pain 2/2 OA. Symptoms well controlled with use of Neurontin. Notes unable to tolerate NSAIDS because of gastritis in past. Continue physical therapy.

## 2013-05-10 NOTE — Progress Notes (Signed)
Subjective:    Patient ID: Alexandra Bishop, female    DOB: [birth year], 68 y.o.   MRN: [id number]  HPI 68YO female with h/o HTN, chronic shoulder and hip pain presents for follow up. Pain symptoms have been well controlled with use of Neurontin. Typically using 100mg  three times daily, but occasionally uses 4x daily when very active and pain symptoms increased. Continues with PT and is working on strengthening and core exercises.  Trying to lose weight with healthy, Mediterranean style diet and increased physical activity.   Review of Systems  Constitutional: Negative for fever, chills, appetite change, fatigue and unexpected weight change.  HENT: Negative for congestion, ear pain, sinus pressure, sore throat, trouble swallowing and voice change.   Eyes: Negative for visual disturbance.  Respiratory: Negative for cough, shortness of breath, wheezing and stridor.   Cardiovascular: Negative for chest pain, palpitations and leg swelling.  Gastrointestinal: Negative for nausea, vomiting, abdominal pain, diarrhea, constipation, blood in stool, abdominal distention and anal bleeding.  Genitourinary: Negative for dysuria and flank pain.  Musculoskeletal: Positive for arthralgias (right shoulder and hip). Negative for gait problem, myalgias and neck pain.  Skin: Negative for color change and rash.  Neurological: Negative for dizziness and headaches.  Hematological: Negative for adenopathy. Does not bruise/bleed easily.  Psychiatric/Behavioral: Negative for suicidal ideas, sleep disturbance and dysphoric mood. The patient is not nervous/anxious.        Objective:    BP 140/62  Pulse 59  Temp(Src) 97.7 F (36.5 C) (Oral)  Wt 203 lb (92.08 kg)  SpO2 98% Physical Exam  Constitutional: She is oriented to person, place, and time. She appears well-developed and well-nourished. No distress.  HENT:  Head: Normocephalic and atraumatic.  Right Ear: External ear normal.  Left Ear: External ear  normal.  Nose: Nose normal.  Mouth/Throat: Oropharynx is clear and moist. No oropharyngeal exudate.  Eyes: Conjunctivae are normal. Pupils are equal, round, and reactive to light. Right eye exhibits no discharge. Left eye exhibits no discharge. No scleral icterus.  Neck: Normal range of motion. Neck supple. No tracheal deviation present. No thyromegaly present.  Cardiovascular: Normal rate, regular rhythm, normal heart sounds and intact distal pulses.  Exam reveals no gallop and no friction rub.   No murmur heard. Pulmonary/Chest: Effort normal and breath sounds normal. No accessory muscle usage. Not tachypneic. No respiratory distress. She has no decreased breath sounds. She has no wheezes. She has no rhonchi. She has no rales. She exhibits no tenderness.  Musculoskeletal: Normal range of motion. She exhibits no edema and no tenderness.  Lymphadenopathy:    She has no cervical adenopathy.  Neurological: She is alert and oriented to person, place, and time. No cranial nerve deficit. She exhibits normal muscle tone. Coordination normal.  Skin: Skin is warm and dry. No rash noted. She is not diaphoretic. No erythema. No pallor.  Psychiatric: She has a normal mood and affect. Her behavior is normal. Judgment and thought content normal.          Assessment & Plan:   Problem List Items Addressed This Visit   Chronic pain     Right hip and shoulder pain 2/2 OA. Symptoms well controlled with use of Neurontin. Notes unable to tolerate NSAIDS because of gastritis in past. Continue physical therapy.    Hyperlipidemia     Will check lipids and CMP with labs today.    Relevant Orders      Lipid panel   Hypertension - Primary  BP Readings from Last 3 Encounters:  05/10/13 140/62  02/06/13 122/78  01/09/13 130/80   BP well controlled generally on Amlodipine. Will check renal function with labs today.    Relevant Orders      Comprehensive metabolic panel   Hypothyroidism      Symptomatically doing well. Will check TSH with labs today.    Relevant Orders      TSH   Obesity      Wt Readings from Last 3 Encounters:  05/10/13 203 lb (92.08 kg)  02/06/13 207 lb (93.895 kg)  01/09/13 204 lb (92.534 kg)   Congratulated pt on weight loss. Discussed supportive groups such as [facility]. Encouraged continued effort at [employer] and regular exercise.     Other Visit Diagnoses   Polycythemia, secondary        Relevant Orders       CBC with Differential        Return in about 7 months (around 12/08/2013) for Wellness Visit.

## 2013-05-10 NOTE — Assessment & Plan Note (Signed)
BP Readings from Last 3 Encounters:  05/10/13 140/62  02/06/13 122/78  01/09/13 130/80   BP well controlled generally on Amlodipine. Will check renal function with labs today.

## 2013-05-10 NOTE — Assessment & Plan Note (Signed)
Will check lipids and CMP with labs today.

## 2013-05-11 ENCOUNTER — Telehealth: Payer: Self-pay | Admitting: Internal Medicine

## 2013-05-11 NOTE — Telephone Encounter (Signed)
Relevant patient education assigned to patient using Emmi. ° °

## 2013-05-20 DIAGNOSIS — M549 Dorsalgia, unspecified: Secondary | ICD-10-CM | POA: Diagnosis not present

## 2013-06-10 ENCOUNTER — Other Ambulatory Visit: Payer: Self-pay | Admitting: Internal Medicine

## 2013-06-11 NOTE — Telephone Encounter (Signed)
Ok to refill 

## 2013-06-12 ENCOUNTER — Other Ambulatory Visit: Payer: Self-pay | Admitting: Internal Medicine

## 2013-06-12 NOTE — Telephone Encounter (Signed)
Okay to refill? 

## 2013-06-14 ENCOUNTER — Other Ambulatory Visit: Payer: Self-pay | Admitting: Internal Medicine

## 2013-06-20 DIAGNOSIS — M549 Dorsalgia, unspecified: Secondary | ICD-10-CM | POA: Diagnosis not present

## 2013-06-27 ENCOUNTER — Telehealth: Payer: Self-pay | Admitting: Internal Medicine

## 2013-06-27 ENCOUNTER — Encounter: Payer: Self-pay | Admitting: Internal Medicine

## 2013-06-27 ENCOUNTER — Encounter: Payer: Self-pay | Admitting: Emergency Medicine

## 2013-06-27 ENCOUNTER — Ambulatory Visit (INDEPENDENT_AMBULATORY_CARE_PROVIDER_SITE_OTHER): Payer: Medicare Other | Admitting: Internal Medicine

## 2013-06-27 VITALS — BP 118/78 | HR 75 | Temp 98.2°F | Wt 203.0 lb

## 2013-06-27 DIAGNOSIS — I10 Essential (primary) hypertension: Secondary | ICD-10-CM | POA: Diagnosis not present

## 2013-06-27 DIAGNOSIS — E039 Hypothyroidism, unspecified: Secondary | ICD-10-CM

## 2013-06-27 DIAGNOSIS — M545 Low back pain, unspecified: Secondary | ICD-10-CM | POA: Diagnosis not present

## 2013-06-27 DIAGNOSIS — G8929 Other chronic pain: Secondary | ICD-10-CM

## 2013-06-27 DIAGNOSIS — B86 Scabies: Secondary | ICD-10-CM | POA: Diagnosis not present

## 2013-06-27 LAB — TSH: TSH: 0.35 u[IU]/mL (ref 0.35–5.50)

## 2013-06-27 LAB — T3, FREE: T3, Free: 3 pg/mL (ref 2.3–4.2)

## 2013-06-27 LAB — T4, FREE: Free T4: 1.26 ng/dL (ref 0.60–1.60)

## 2013-06-27 MED ORDER — PERMETHRIN 5 % EX CREA: TOPICAL_CREAM | CUTANEOUS | Status: DC

## 2013-06-27 NOTE — Assessment & Plan Note (Signed)
Chronic aching low back pain. Had some improvement in the past with physical therapy. Will set up repeat physical therapy. Note she is unable to tolerate NSAIDS because of previous history of gastritis.

## 2013-06-27 NOTE — Assessment & Plan Note (Signed)
Exam is consistent with scabies. Will treat with permethrin. Discussed importance of cleaning her home and clothes. Followup if symptoms are persistent.

## 2013-06-27 NOTE — Assessment & Plan Note (Signed)
BP Readings from Last 3 Encounters:  06/27/13 118/78  05/10/13 140/62  02/06/13 122/78   BP well controlled on Amlodipine. Will continue.

## 2013-06-27 NOTE — Assessment & Plan Note (Addendum)
Patient is concerned about thyroid dysfunction is leading to weight gain. Offered reassurance today. Discussed the difference between thymus and thyroid. Reviewed the articles she brought, which suggest thymus dysfunction may be causing inability to lose weight. Discussed that the thymus typically regresses in childhood.  Recent TSH was normal. She requests endocrine evaluation. Will check TSH, T4, T3, and thyroid antibodies with labs today. Will set up endocrine evaluation. In my opinion her inability to lose weight is secondary to sedentary lifestyle and excessive caloric intake, not thyroid function.

## 2013-06-27 NOTE — Progress Notes (Signed)
Subjective:    Patient ID: Alexandra Bishop, female    DOB: 04-08-1945, 68 y.o.   MRN: [id number]  HPI 68YO female presents for follow up. She has several concerns today.  Bug bites on left hip - linear red marks on hands, in between fingers, around waist line. Very itchy. Unsure how long present. Seem to be getting worse. Recently bought a used chair for her home, questions if this may be source.  Arthralgia - Pain in ankles, shoulders, as well as low back pain. Stopped going to gym because of cost. Would like to start back with PT because this was helpful in the past.  Thyroid dysfunction -she has been reading articles about the thymus which she asked me to review today. She is concerned that there may be an abnormality of her thymus which is leading to thyroid dysfunction. She is concerned about side effects from levothyroxine. She is concerned that levothyroxine and thyroid dysfunction is leading to her inability to lose weight, decreased libido, and depression.  She is also concerned about calcium supplementation. She has been taking calcium only every other day. She questions if this is adequate.  Review of Systems  Constitutional: Positive for fatigue. Negative for fever, chills, appetite change and unexpected weight change.  HENT: Negative for congestion, ear pain, sinus pressure, sore throat, trouble swallowing and voice change.   Eyes: Negative for visual disturbance.  Respiratory: Negative for cough, shortness of breath, wheezing and stridor.   Cardiovascular: Negative for chest pain, palpitations and leg swelling.  Gastrointestinal: Negative for nausea, vomiting, abdominal pain, diarrhea, constipation, blood in stool, abdominal distention and anal bleeding.  Endocrine: Negative for cold intolerance and heat intolerance.  Genitourinary: Negative for dysuria and flank pain.  Musculoskeletal: Positive for arthralgias, back pain and myalgias. Negative for gait problem and neck pain.    Skin: Positive for rash. Negative for color change.  Neurological: Negative for dizziness, weakness and headaches.  Hematological: Negative for adenopathy. Does not bruise/bleed easily.  Psychiatric/Behavioral: Positive for dysphoric mood. Negative for suicidal ideas and sleep disturbance. The patient is not nervous/anxious.        Objective:    BP 118/78  Pulse 75  Temp(Src) 98.2 F (36.8 C) (Oral)  Wt 203 lb (92.08 kg)  SpO2 95% Physical Exam  Constitutional: She is oriented to person, place, and time. She appears well-developed and well-nourished. No distress.  HENT:  Head: Normocephalic and atraumatic.  Right Ear: External ear normal.  Left Ear: External ear normal.  Nose: Nose normal.  Mouth/Throat: Oropharynx is clear and moist. No oropharyngeal exudate.  Eyes: Conjunctivae are normal. Pupils are equal, round, and reactive to light. Right eye exhibits no discharge. Left eye exhibits no discharge. No scleral icterus.  Neck: Normal range of motion. Neck supple. No tracheal deviation present. No thyromegaly present.  Cardiovascular: Normal rate, regular rhythm, normal heart sounds and intact distal pulses.  Exam reveals no gallop and no friction rub.   No murmur heard. Pulmonary/Chest: Effort normal and breath sounds normal. No accessory muscle usage. Not tachypneic. No respiratory distress. She has no decreased breath sounds. She has no wheezes. She has no rhonchi. She has no rales. She exhibits no tenderness.  Musculoskeletal: Normal range of motion. She exhibits no edema and no tenderness.  Lymphadenopathy:    She has no cervical adenopathy.  Neurological: She is alert and oriented to person, place, and time. No cranial nerve deficit. She exhibits normal muscle tone. Coordination normal.  Skin: Skin is warm  and dry. No rash noted. She is not diaphoretic. No erythema. No pallor.  Psychiatric: She has a normal mood and affect. Her behavior is normal. Judgment and thought  content normal.          Assessment & Plan:  Over 16min of which >50% spent in face-to-face contact with patient discussing plan of care   Problem List Items Addressed This Visit   Chronic low back pain     Chronic aching low back pain. Had some improvement in the past with physical therapy. Will set up repeat physical therapy. Note she is unable to tolerate NSAIDS because of previous history of gastritis.    Relevant Orders      Ambulatory referral to Physical Therapy   Hypertension      BP Readings from Last 3 Encounters:  06/27/13 118/78  05/10/13 140/62  02/06/13 122/78   BP well controlled on Amlodipine. Will continue.    Hypothyroidism - Primary     Patient is concerned about thyroid dysfunction is leading to weight gain. Offered reassurance today. Discussed the difference between thymus and thyroid. Reviewed the articles she brought, which suggest thymus dysfunction may be causing inability to lose weight. Discussed that the thymus typically regresses in childhood.  Recent TSH was normal. She requests endocrine evaluation. Will check TSH, T4, T3, and thyroid antibodies with labs today. Will set up endocrine evaluation. In my opinion her inability to lose weight is secondary to sedentary lifestyle and excessive caloric intake, not thyroid function.    Relevant Orders      TSH      T4, free      T3, free      Thyroid antibodies      Ambulatory referral to Endocrinology   Scabies     Exam is consistent with scabies. Will treat with permethrin. Discussed importance of cleaning her home and clothes. Followup if symptoms are persistent.    Relevant Medications      permethrin (ELIMITE) 5 % cream       Return in about 3 months (around 09/26/2013).

## 2013-06-27 NOTE — Patient Instructions (Signed)
Scabies  Scabies are small bugs (mites) that burrow under the skin and cause red bumps and severe itching. These bugs can only be seen with a microscope. Scabies are highly contagious. They can spread easily from person to person by direct contact. They are also spread through sharing clothing or linens that have the scabies mites living in them. It is not unusual for an entire family to become infected through shared towels, clothing, or bedding.   HOME CARE INSTRUCTIONS   · Your caregiver may prescribe a cream or lotion to kill the mites. If cream is prescribed, massage the cream into the entire body from the neck to the bottom of both feet. Also massage the cream into the scalp and face if your child is less than 1 year old. Avoid the eyes and mouth. Do not wash your hands after application.  · Leave the cream on for 8 to 12 hours. Your child should bathe or shower after the 8 to 12 hour application period. Sometimes it is helpful to apply the cream to your child right before bedtime.  · One treatment is usually effective and will eliminate approximately 95% of infestations. For severe cases, your caregiver may decide to repeat the treatment in 1 week. Everyone in your household should be treated with one application of the cream.  · New rashes or burrows should not appear within 24 to 48 hours after successful treatment. However, the itching and rash may last for 2 to 4 weeks after successful treatment. Your caregiver may prescribe a medicine to help with the itching or to help the rash go away more quickly.  · Scabies can live on clothing or linens for up to 3 days. All of your child's recently used clothing, towels, stuffed toys, and bed linens should be washed in hot water and then dried in a dryer for at least 20 minutes on high heat. Items that cannot be washed should be enclosed in a plastic bag for at least 3 days.  · To help relieve itching, bathe your child in a cool bath or apply cool washcloths to the  affected areas.  · Your child may return to school after treatment with the prescribed cream.  SEEK MEDICAL CARE IF:   · The itching persists longer than 4 weeks after treatment.  · The rash spreads or becomes infected. Signs of infection include red blisters or yellow-tan crust.  Document Released: 03/08/2005 Document Revised: 05/31/2011 Document Reviewed: 07/17/2008  [employer]® Patient Information ©2014 [employer].

## 2013-06-27 NOTE — Progress Notes (Signed)
Pre visit review using our clinic review tool, if applicable. No additional management support is needed unless otherwise documented below in the visit note. 

## 2013-06-27 NOTE — Telephone Encounter (Signed)
At check out pt states when results come in she prefers to receive a phone call.

## 2013-06-28 LAB — THYROID ANTIBODIES
Thyroglobulin Ab: <20 U/mL (ref <40.0)
Thyroperoxidase Ab SerPl-aCnc: <10 IU/mL (ref <35.0)

## 2013-07-03 NOTE — Telephone Encounter (Signed)
Informed patient everything was normal and her results are viewable through [facility].

## 2013-07-09 ENCOUNTER — Other Ambulatory Visit: Payer: Self-pay | Admitting: Internal Medicine

## 2013-07-10 NOTE — Telephone Encounter (Signed)
Okay to refill? Last seen on 06/27/13

## 2013-07-12 ENCOUNTER — Ambulatory Visit: Payer: Medicare Other | Admitting: Internal Medicine

## 2013-07-18 ENCOUNTER — Ambulatory Visit (INDEPENDENT_AMBULATORY_CARE_PROVIDER_SITE_OTHER): Payer: Medicare Other | Admitting: Podiatry

## 2013-07-18 ENCOUNTER — Encounter: Payer: Self-pay | Admitting: Podiatry

## 2013-07-18 ENCOUNTER — Ambulatory Visit (INDEPENDENT_AMBULATORY_CARE_PROVIDER_SITE_OTHER): Payer: Medicare Other

## 2013-07-18 VITALS — BP 118/70 | HR 75 | Resp 16

## 2013-07-18 DIAGNOSIS — M722 Plantar fascial fibromatosis: Secondary | ICD-10-CM

## 2013-07-18 NOTE — Progress Notes (Signed)
Right heel pain , and going up ankle .  Objective: Vital signs are stable she is alert and oriented x3 pulses are strongly palpable bilateral lower extremity. She has no reproducible pain about the lateral right foot today lateral ankle or the subtalar joint. Radiographs do confirm open reduction internal fixation is gone to heal right ankle uneventfully. See no signs of osseous abnormalities on radiograph.  Assessment: Neuritis plantar fasciitis.  Plan: Followup with me as needed.

## 2013-07-20 DIAGNOSIS — M549 Dorsalgia, unspecified: Secondary | ICD-10-CM | POA: Diagnosis not present

## 2013-07-24 DIAGNOSIS — M549 Dorsalgia, unspecified: Secondary | ICD-10-CM | POA: Diagnosis not present

## 2013-07-26 DIAGNOSIS — M549 Dorsalgia, unspecified: Secondary | ICD-10-CM | POA: Diagnosis not present

## 2013-08-02 DIAGNOSIS — M549 Dorsalgia, unspecified: Secondary | ICD-10-CM | POA: Diagnosis not present

## 2013-08-07 ENCOUNTER — Other Ambulatory Visit: Payer: Self-pay | Admitting: Internal Medicine

## 2013-08-07 DIAGNOSIS — M549 Dorsalgia, unspecified: Secondary | ICD-10-CM | POA: Diagnosis not present

## 2013-08-16 DIAGNOSIS — M549 Dorsalgia, unspecified: Secondary | ICD-10-CM | POA: Diagnosis not present

## 2013-08-20 DIAGNOSIS — M549 Dorsalgia, unspecified: Secondary | ICD-10-CM | POA: Diagnosis not present

## 2013-08-22 ENCOUNTER — Ambulatory Visit (INDEPENDENT_AMBULATORY_CARE_PROVIDER_SITE_OTHER): Payer: Medicare Other | Admitting: Internal Medicine

## 2013-08-22 ENCOUNTER — Encounter: Payer: Self-pay | Admitting: Internal Medicine

## 2013-08-22 ENCOUNTER — Telehealth: Payer: Self-pay | Admitting: Internal Medicine

## 2013-08-22 VITALS — BP 124/68 | HR 54 | Temp 98.0°F | Ht 66.0 in | Wt 199.5 lb

## 2013-08-22 DIAGNOSIS — E039 Hypothyroidism, unspecified: Secondary | ICD-10-CM | POA: Diagnosis not present

## 2013-08-22 DIAGNOSIS — G4700 Insomnia, unspecified: Secondary | ICD-10-CM

## 2013-08-22 DIAGNOSIS — R3915 Urgency of urination: Secondary | ICD-10-CM | POA: Insufficient documentation

## 2013-08-22 DIAGNOSIS — G8929 Other chronic pain: Secondary | ICD-10-CM

## 2013-08-22 LAB — POCT URINALYSIS DIPSTICK
Bilirubin, UA: NEGATIVE
Blood, UA: NEGATIVE
Glucose, UA: NEGATIVE
Nitrite, UA: NEGATIVE
Protein, UA: NEGATIVE
Spec Grav, UA: 1.025
Urobilinogen, UA: 0.2
pH, UA: 5.5

## 2013-08-22 MED ORDER — ZALEPLON 10 MG PO CAPS: 10.0000 mg | ORAL_CAPSULE | Freq: Every evening | ORAL | Status: DC | PRN

## 2013-08-22 NOTE — Assessment & Plan Note (Signed)
Recent thyroid labs were normal. Pt is concerned that medication, levothyroxine, is causing her to have low libido. I told her that I do not think this is the case. However, we will set up endocrine evaluation for her to discuss further.

## 2013-08-22 NOTE — Assessment & Plan Note (Signed)
Chronic insomnia. Discussed good sleep hygiene. No improvement with efforts at this. Sleeps only with use of Ambien, which her insurance will not cover. Will try Sonata.

## 2013-08-22 NOTE — Patient Instructions (Signed)
Zaleplon capsules What is this medicine? ZALEPLON (ZAL e plon) is used to treat insomnia. This medicine helps you to fall asleep. This medicine may be used for other purposes; ask your health care provider or pharmacist if you have questions. COMMON BRAND NAME(S): Sonata What should I tell my health care provider before I take this medicine? They need to know if you have any of these conditions: -depression -history of a drug or alcohol abuse problem -liver disease -lung or breathing disease -suicidal thoughts -an unusual or allergic reaction to zaleplon, other medicines, foods, dyes, or preservatives -pregnant or trying to get pregnant -breast-feeding How should I use this medicine? Take this medicine by mouth with a glass of water. Follow the directions on the prescription label. It is better to take this medicine on an empty stomach and only when you are ready for bed. Do not take your medicine more often than directed. If you have been taking this medicine for several weeks and suddenly stop taking it, you may get unpleasant withdrawal symptoms. Your doctor or health care professional may want to gradually reduce the dose. Do not stop taking this medicine on your own. Always follow your doctor or health care professional's advice. Talk to your pediatrician regarding the use of this medicine in children. Special care may be needed. Overdosage: If you think you have taken too much of this medicine contact a poison control center or emergency room at once. NOTE: This medicine is only for you. Do not share this medicine with others. What if I miss a dose? This does not apply. This medicine should only be taken immediately before going to sleep. Do not take double or extra doses. What may interact with this medicine? -barbiturate medicines for inducing sleep or treating seizures -carbamazepine -certain medicines for allergies, like azatadine, clemastine, diphenhydramine -certain medicines  for depression, anxiety, or other emotional or psychiatric problems -certain medicines for pain -cimetidine -erythromycin -medicines for fungal infections like ketoconazole, fluconazole, or itraconazole -other medicines given for sleep -phenytoin -rifampin This list may not describe all possible interactions. Give your health care provider a list of all the medicines, herbs, non-prescription drugs, or dietary supplements you use. Also tell them if you smoke, drink alcohol, or use illegal drugs. Some items may interact with your medicine. What should I watch for while using this medicine? Visit your doctor or health care professional for regular checks on your progress. Keep a regular sleep schedule by going to bed at about the same time each night. Avoid caffeine-containing drinks in the evening hours. When sleep medicines are used every night for more than a few weeks, they may stop working. Talk to your doctor if you still have trouble sleeping. Do not take this medicine unless you are able to get a full night's sleep before you must be active again. You may not be able to remember things that you do in the hours after you take this medicine. Some people have reported driving, making phone calls, or preparing and eating food while asleep after taking sleep medicine. Take this medicine right before going to sleep. Tell your doctor if you are have any problems with your memory. After you stop taking this medicine, you may have trouble falling asleep. This is called rebound insomnia. This problem usually goes away on its own after 1 or 2 nights. You may get drowsy or dizzy. Do not drive, use machinery, or do anything that needs mental alertness until you know how this medicine affects you.  Do not stand or sit up quickly, especially if you are an older patient. This reduces the risk of dizzy or fainting spells. Alcohol may interfere with the effect of this medicine. Avoid alcoholic drinks. If you or your  family notice any changes in your behavior, or if you have any unusual or disturbing thoughts, call your doctor right away. What side effects may I notice from receiving this medicine? Side effects that you should report to your doctor or health care professional as soon as possible: -allergic reactions like skin rash, itching or hives, swelling of the face, lips, or tongue -breathing problems -changes in vision -confusion -depression, suicidal thoughts -feeling faint or lightheaded -hallucinations -hostility, restlessness, excitability -slurred speech -staggering, tremors -unusual activities while asleep like driving, eating, making phone calls -unusually weak or tired Side effects that usually do not require medical attention (report to your doctor or health care professional if they continue or are bothersome): -diarrhea -difficulty with coordination -loss of memory -nightmares -stomach upset This list may not describe all possible side effects. Call your doctor for medical advice about side effects. You may report side effects to FDA at 1-800-FDA-1088. Where should I keep my medicine? Keep out of the reach of children. This medicine can be abused. Keep your medicine in a safe place to protect it from theft. Do not share this medicine with anyone. Selling or giving away this medicine is dangerous and against the law. Store at room temperature between 20 and 25 degrees C (68 and 77 degrees F). Protect from light. Throw away any unused medicine after the expiration date. NOTE: This sheet is a summary. It may not cover all possible information. If you have questions about this medicine, talk to your doctor, pharmacist, or health care provider.  2014, [employer]vier/Gold Standard. (2007-12-05 14:47:48)

## 2013-08-22 NOTE — Assessment & Plan Note (Signed)
Chronic low back pain with radiation to right hip. No improvement with NSAIDS. No improvement with several months of PT. Will set up MRI of lumbar spine for further evaluation.

## 2013-08-22 NOTE — Telephone Encounter (Signed)
zaleplon (SONATA) 10 MG capsule not covered by insurance needing something else.

## 2013-08-22 NOTE — Progress Notes (Signed)
Pre visit review using our clinic review tool, if applicable. No additional management support is needed unless otherwise documented below in the visit note. 

## 2013-08-22 NOTE — Assessment & Plan Note (Signed)
Discussed this is likely secondary to atrophic vaginitis, as UA normal today. Will address further at next scheduled visit.

## 2013-08-22 NOTE — Progress Notes (Signed)
Subjective:    Patient ID: Alexandra Bishop, female    DOB: January 30, 1946, 68 y.o.   MRN: [id number]  HPI 68YO female presents as a walk in urgent visit for insomnia.  Insomnia - Concerned about chronic insomnia. Cannot sleep without use of Ambien 10mg . No improvement with 5mg  dosing. Her insurance will not pay for this medication. They will allow only 90 pills per year. Trying to practice good sleep hygiene. No improvement with this. Would like to consider other options. Alexandra Bishop in the past with no improvement.  Low Libido - Concerned that use of levothyroxine may be causing low libido. Would like to speak with endocrinologist about this.  Chronic arthralgia - Continues to have PT however continuing to have pain in lower back and right hip. Has ankle pain and occasional right leg weakness. No numbness. No loss of control of bowel or bladder.  No improvement with PT. Unable to tolerate NSAIDS because of GERD.  Notes some urinary urgency and hesitancy. No fever, chills, flank pain. No dysuria.  Review of Systems  Constitutional: Negative for fever, chills, appetite change, fatigue and unexpected weight change.  HENT: Negative for congestion, ear pain, sinus pressure, sore throat, trouble swallowing and voice change.   Eyes: Negative for visual disturbance.  Respiratory: Negative for cough, shortness of breath, wheezing and stridor.   Cardiovascular: Negative for chest pain, palpitations and leg swelling.  Gastrointestinal: Negative for nausea, vomiting, abdominal pain, diarrhea, constipation, blood in stool, abdominal distention and anal bleeding.  Genitourinary: Positive for urgency, decreased urine volume and difficulty urinating. Negative for dysuria, frequency, hematuria, flank pain, vaginal bleeding, vaginal discharge, genital sores, vaginal pain, pelvic pain and dyspareunia.  Musculoskeletal: Positive for arthralgias, back pain and myalgias. Negative for gait problem and neck pain.    Skin: Negative for color change and rash.  Neurological: Negative for dizziness and headaches.  Hematological: Negative for adenopathy. Does not bruise/bleed easily.  Psychiatric/Behavioral: Positive for sleep disturbance. Negative for suicidal ideas and dysphoric mood. The patient is not nervous/anxious.        Objective:    BP 124/68  Pulse 54  Temp(Src) 98 F (36.7 C) (Oral)  Ht 5\' 6"  (1.676 m)  Wt 199 lb 8 oz (90.493 kg)  BMI 32.22 kg/m2  SpO2 97% Physical Exam  Constitutional: She is oriented to person, place, and time. She appears well-developed and well-nourished. No distress.  HENT:  Head: Normocephalic and atraumatic.  Right Ear: External ear normal.  Left Ear: External ear normal.  Nose: Nose normal.  Mouth/Throat: Oropharynx is clear and moist. No oropharyngeal exudate.  Eyes: Conjunctivae are normal. Pupils are equal, round, and reactive to light. Right eye exhibits no discharge. Left eye exhibits no discharge. No scleral icterus.  Neck: Normal range of motion. Neck supple. No tracheal deviation present. No thyromegaly present.  Cardiovascular: Normal rate, regular rhythm, normal heart sounds and intact distal pulses.  Exam reveals no gallop and no friction rub.   No murmur heard. Pulmonary/Chest: Effort normal and breath sounds normal. No accessory muscle usage. Not tachypneic. No respiratory distress. She has no decreased breath sounds. She has no wheezes. She has no rhonchi. She has no rales. She exhibits no tenderness.  Musculoskeletal: Normal range of motion. She exhibits no edema.       Lumbar back: She exhibits tenderness and pain. She exhibits normal range of motion and no bony tenderness.  Lymphadenopathy:    She has no cervical adenopathy.  Neurological: She is alert and oriented  to person, place, and time. She displays no atrophy. No cranial nerve deficit or sensory deficit. She exhibits normal muscle tone. Coordination and gait normal.  Reflex Scores:       Patellar reflexes are 1+ on the right side and 1+ on the left side. Skin: Skin is warm and dry. No rash noted. She is not diaphoretic. No erythema. No pallor.  Psychiatric: She has a normal mood and affect. Her behavior is normal. Judgment and thought content normal.          Assessment & Plan:   Problem List Items Addressed This Visit     Unprioritized   Chronic pain     Chronic low back pain with radiation to right hip. No improvement with NSAIDS. No improvement with several months of PT. Will set up MRI of lumbar spine for further evaluation.    Relevant Orders      MR Lumbar Spine Wo Contrast   Hypothyroidism     Recent thyroid labs were normal. Pt is concerned that medication, levothyroxine, is causing her to have low libido. I told her that I do not think this is the case. However, we will set up endocrine evaluation for her to discuss further.    Insomnia - Primary     Chronic insomnia. Discussed good sleep hygiene. No improvement with efforts at this. Sleeps only with use of Ambien, which her insurance will not cover. Will try Sonata.     Relevant Medications      zaleplon (SONATA) capsule   Urinary urgency     Discussed this is likely secondary to atrophic vaginitis, as UA normal today. Will address further at next scheduled visit.    Relevant Orders      POCT Urinalysis Dipstick (Completed)      Urine culture       Return if symptoms worsen or fail to improve.

## 2013-08-23 NOTE — Telephone Encounter (Signed)
Advised pt to call insurance and ask what comparable medication that they would cover and let us know

## 2013-08-24 LAB — URINE CULTURE: Colony Count: 6000

## 2013-09-02 ENCOUNTER — Other Ambulatory Visit: Payer: Self-pay | Admitting: Internal Medicine

## 2013-09-03 ENCOUNTER — Other Ambulatory Visit: Payer: Self-pay | Admitting: *Deleted

## 2013-09-03 MED ORDER — GABAPENTIN 100 MG PO CAPS: ORAL_CAPSULE | ORAL | Status: DC

## 2013-09-03 NOTE — Telephone Encounter (Signed)
Pt requesting 90 day supply

## 2013-09-06 ENCOUNTER — Ambulatory Visit: Payer: Self-pay | Admitting: Internal Medicine

## 2013-09-06 DIAGNOSIS — M48061 Spinal stenosis, lumbar region without neurogenic claudication: Secondary | ICD-10-CM | POA: Diagnosis not present

## 2013-09-06 DIAGNOSIS — G8929 Other chronic pain: Secondary | ICD-10-CM | POA: Diagnosis not present

## 2013-09-06 DIAGNOSIS — M5137 Other intervertebral disc degeneration, lumbosacral region: Secondary | ICD-10-CM | POA: Diagnosis not present

## 2013-09-07 ENCOUNTER — Telehealth: Payer: Self-pay | Admitting: Internal Medicine

## 2013-09-07 DIAGNOSIS — M48061 Spinal stenosis, lumbar region without neurogenic claudication: Secondary | ICD-10-CM

## 2013-09-07 NOTE — Telephone Encounter (Signed)
Notified pt. 

## 2013-09-07 NOTE — Telephone Encounter (Signed)
MRI lumbar spine showed multilevel disc disease with spinal stenosis. I would recommend evaluation with neurosurgery. I will place referral for this.

## 2013-09-18 ENCOUNTER — Encounter: Payer: Self-pay | Admitting: Internal Medicine

## 2013-09-19 DIAGNOSIS — M549 Dorsalgia, unspecified: Secondary | ICD-10-CM | POA: Diagnosis not present

## 2013-09-24 DIAGNOSIS — Z6831 Body mass index (BMI) 31.0-31.9, adult: Secondary | ICD-10-CM | POA: Diagnosis not present

## 2013-09-24 DIAGNOSIS — M539 Dorsopathy, unspecified: Secondary | ICD-10-CM | POA: Insufficient documentation

## 2013-09-24 DIAGNOSIS — M549 Dorsalgia, unspecified: Secondary | ICD-10-CM | POA: Diagnosis not present

## 2013-10-01 ENCOUNTER — Encounter: Payer: Self-pay | Admitting: Internal Medicine

## 2013-10-01 ENCOUNTER — Ambulatory Visit (INDEPENDENT_AMBULATORY_CARE_PROVIDER_SITE_OTHER): Payer: Medicare Other | Admitting: Internal Medicine

## 2013-10-01 VITALS — BP 110/72 | HR 79 | Temp 98.2°F | Ht 66.0 in | Wt 197.5 lb

## 2013-10-01 DIAGNOSIS — G4700 Insomnia, unspecified: Secondary | ICD-10-CM

## 2013-10-01 DIAGNOSIS — G8929 Other chronic pain: Secondary | ICD-10-CM

## 2013-10-01 DIAGNOSIS — Z0000 Encounter for general adult medical examination without abnormal findings: Secondary | ICD-10-CM | POA: Diagnosis not present

## 2013-10-01 DIAGNOSIS — M545 Low back pain, unspecified: Secondary | ICD-10-CM | POA: Diagnosis not present

## 2013-10-01 MED ORDER — TEMAZEPAM 15 MG PO CAPS: 15.0000 mg | ORAL_CAPSULE | Freq: Every evening | ORAL | Status: DC | PRN

## 2013-10-01 NOTE — Assessment & Plan Note (Signed)
Persistent insomnia. It appears her insurance will cover Temazepam, so will give trial of this medication. We discussed risks of daytime drowsiness while taking this med. Follow up in 6 weeks and prn.

## 2013-10-01 NOTE — Progress Notes (Signed)
Pre visit review using our clinic review tool, if applicable. No additional management support is needed unless otherwise documented below in the visit note. 

## 2013-10-01 NOTE — Assessment & Plan Note (Signed)
Chronic low back pain secondary to spinal stenosis. Recently seen by [facility]. Started on Relafen and Flexeril. Unable to tolerate these medications because of drowsiness. Will continue Neurontin, as this has helped with symptoms. Discussed referral to Dr. Sharlet Salina, and she will consider this. She is going to first look into accupuncture.

## 2013-10-01 NOTE — Patient Instructions (Addendum)
Start Temazepam 15mg  at bedtime to help with sleep. Call or email by Wednesday with update.  Follow up in 4 weeks or sooner as needed.

## 2013-10-01 NOTE — Progress Notes (Signed)
Subjective:    Patient ID: Alexandra Bishop, female    DOB: [birth year], 68 y.o.   MRN: [id number]  HPI 68YO female presents for follow up.  Chronic low back pain - MRI lumbar spine showed multilevel disc disease with spinal stenosis. Referral to [facility] was placed. Pt was seen by Dr. Hal Neer last week. Started on Flexeril and Relafen. Medications made her feel moody and drowsy. She stopped the medication.  Insomnia - Symptoms of difficulty falling and staying asleep improved only with Ambien, however her insurance will not cover this. Would like to try alternative medication.  Review of Systems  Constitutional: Negative for fever, chills, appetite change, fatigue and unexpected weight change.  Eyes: Negative for visual disturbance.  Respiratory: Negative for shortness of breath.   Cardiovascular: Negative for chest pain and leg swelling.  Gastrointestinal: Negative for nausea, vomiting, abdominal pain, diarrhea and constipation.  Musculoskeletal: Positive for arthralgias, back pain and myalgias.  Skin: Negative for color change and rash.  Hematological: Negative for adenopathy. Does not bruise/bleed easily.  Psychiatric/Behavioral: Positive for sleep disturbance. Negative for dysphoric mood. The patient is not nervous/anxious.        Objective:    BP 110/72  Pulse 79  Temp(Src) 98.2 F (36.8 C) (Oral)  Ht 5\' 6"  (1.676 m)  Wt 197 lb 8 oz (89.585 kg)  BMI 31.89 kg/m2  SpO2 97% Physical Exam  Constitutional: She is oriented to person, place, and time. She appears well-developed and well-nourished. No distress.  HENT:  Head: Normocephalic and atraumatic.  Right Ear: External ear normal.  Left Ear: External ear normal.  Nose: Nose normal.  Mouth/Throat: Oropharynx is clear and moist. No oropharyngeal exudate.  Eyes: Conjunctivae are normal. Pupils are equal, round, and reactive to light. Right eye exhibits no discharge. Left eye exhibits no discharge. No scleral icterus.  Neck: Normal  range of motion. Neck supple. No tracheal deviation present. No thyromegaly present.  Cardiovascular: Normal rate, regular rhythm, normal heart sounds and intact distal pulses.  Exam reveals no gallop and no friction rub.   No murmur heard. Pulmonary/Chest: Effort normal and breath sounds normal. No accessory muscle usage. Not tachypneic. No respiratory distress. She has no decreased breath sounds. She has no wheezes. She has no rhonchi. She has no rales. She exhibits no tenderness.  Musculoskeletal: She exhibits no edema and no tenderness.       Lumbar back: She exhibits pain. She exhibits normal range of motion and no tenderness.  Lymphadenopathy:    She has no cervical adenopathy.  Neurological: She is alert and oriented to person, place, and time. No cranial nerve deficit. She exhibits normal muscle tone. Coordination normal.  Skin: Skin is warm and dry. No rash noted. She is not diaphoretic. No erythema. No pallor.  Psychiatric: She has a normal mood and affect. Her behavior is normal. Judgment and thought content normal.          Assessment & Plan:   Problem List Items Addressed This Visit     Unprioritized   Chronic low back pain - Primary     Chronic low back pain secondary to spinal stenosis. Recently seen by [facility]. Started on Relafen and Flexeril. Unable to tolerate these medications because of drowsiness. Will continue Neurontin, as this has helped with symptoms. Discussed referral to Dr. Sharlet Salina, and she will consider this. She is going to first look into accupuncture.    Insomnia     Persistent insomnia. It appears her insurance will cover Temazepam,  so will give trial of this medication. We discussed risks of daytime drowsiness while taking this med. Follow up in 6 weeks and prn.    Relevant Medications      temazepam (RESTORIL) capsule   RESOLVED: Medicare annual wellness visit, subsequent       Return in about 9 weeks (around 12/03/2013) for Wellness Visit.

## 2013-10-02 DIAGNOSIS — L821 Other seborrheic keratosis: Secondary | ICD-10-CM | POA: Diagnosis not present

## 2013-10-02 DIAGNOSIS — L408 Other psoriasis: Secondary | ICD-10-CM | POA: Diagnosis not present

## 2013-10-02 DIAGNOSIS — L820 Inflamed seborrheic keratosis: Secondary | ICD-10-CM | POA: Diagnosis not present

## 2013-10-02 DIAGNOSIS — D239 Other benign neoplasm of skin, unspecified: Secondary | ICD-10-CM | POA: Diagnosis not present

## 2013-10-02 DIAGNOSIS — Z1283 Encounter for screening for malignant neoplasm of skin: Secondary | ICD-10-CM | POA: Diagnosis not present

## 2013-10-05 ENCOUNTER — Telehealth: Payer: Self-pay | Admitting: Internal Medicine

## 2013-10-05 MED ORDER — TEMAZEPAM 7.5 MG PO CAPS: 7.5000 mg | ORAL_CAPSULE | Freq: Every evening | ORAL | Status: DC | PRN

## 2013-10-05 NOTE — Telephone Encounter (Signed)
Pt came in and stated taking new sleeping aid pill and wanted to see if could get a lower dosage.

## 2013-10-05 NOTE — Telephone Encounter (Signed)
I have printed a new Rx for Temazepam 7.5mg  daily at bedtime.

## 2013-10-05 NOTE — Telephone Encounter (Signed)
New Rx sent to pharmacy

## 2013-10-05 NOTE — Telephone Encounter (Signed)
Pt was prescribed Temazepam 15mg .  Please advise adjustment

## 2013-10-05 NOTE — Telephone Encounter (Signed)
Please see incoming call note

## 2013-11-05 ENCOUNTER — Other Ambulatory Visit: Payer: Self-pay | Admitting: Internal Medicine

## 2013-11-05 NOTE — Telephone Encounter (Signed)
Ok refill? 

## 2013-11-20 ENCOUNTER — Other Ambulatory Visit: Payer: Self-pay | Admitting: Internal Medicine

## 2013-12-03 ENCOUNTER — Encounter: Payer: Self-pay | Admitting: Internal Medicine

## 2013-12-03 ENCOUNTER — Ambulatory Visit (INDEPENDENT_AMBULATORY_CARE_PROVIDER_SITE_OTHER): Payer: Medicare Other | Admitting: Internal Medicine

## 2013-12-03 VITALS — BP 124/84 | HR 64 | Temp 98.2°F | Ht 66.5 in | Wt 196.2 lb

## 2013-12-03 DIAGNOSIS — Z23 Encounter for immunization: Secondary | ICD-10-CM

## 2013-12-03 DIAGNOSIS — Z6831 Body mass index (BMI) 31.0-31.9, adult: Secondary | ICD-10-CM

## 2013-12-03 DIAGNOSIS — E785 Hyperlipidemia, unspecified: Secondary | ICD-10-CM | POA: Diagnosis not present

## 2013-12-03 DIAGNOSIS — Z0000 Encounter for general adult medical examination without abnormal findings: Secondary | ICD-10-CM | POA: Diagnosis not present

## 2013-12-03 DIAGNOSIS — Z780 Asymptomatic menopausal state: Secondary | ICD-10-CM

## 2013-12-03 DIAGNOSIS — E559 Vitamin D deficiency, unspecified: Secondary | ICD-10-CM

## 2013-12-03 DIAGNOSIS — I10 Essential (primary) hypertension: Secondary | ICD-10-CM

## 2013-12-03 DIAGNOSIS — E669 Obesity, unspecified: Secondary | ICD-10-CM

## 2013-12-03 LAB — COMPREHENSIVE METABOLIC PANEL
ALT: 26 U/L (ref 0–35)
AST: 31 U/L (ref 0–37)
Albumin: 4.2 g/dL (ref 3.5–5.2)
Alkaline Phosphatase: 81 U/L (ref 39–117)
BUN: 17 mg/dL (ref 6–23)
CO2: 26 mEq/L (ref 19–32)
Calcium: 9.3 mg/dL (ref 8.4–10.5)
Chloride: 104 mEq/L (ref 96–112)
Creatinine, Ser: 0.8 mg/dL (ref 0.4–1.2)
GFR: 74.23 mL/min (ref >60.00)
Glucose, Bld: 82 mg/dL (ref 70–99)
Potassium: 4.5 mEq/L (ref 3.5–5.1)
Sodium: 137 mEq/L (ref 135–145)
Total Bilirubin: 0.8 mg/dL (ref 0.2–1.2)
Total Protein: 6.6 g/dL (ref 6.0–8.3)

## 2013-12-03 LAB — CBC WITH DIFFERENTIAL/PLATELET
Basophils Absolute: 0.1 10*3/uL (ref 0.0–0.1)
Basophils Relative: 0.7 % (ref 0.0–3.0)
Eosinophils Absolute: 0.3 10*3/uL (ref 0.0–0.7)
Eosinophils Relative: 4 % (ref 0.0–5.0)
HCT: 46.9 % — ABNORMAL HIGH (ref 36.0–46.0)
Hemoglobin: 15.4 g/dL — ABNORMAL HIGH (ref 12.0–15.0)
Lymphocytes Relative: 21.1 % (ref 12.0–46.0)
Lymphs Abs: 1.5 10*3/uL (ref 0.7–4.0)
MCHC: 32.9 g/dL (ref 30.0–36.0)
MCV: 84.3 fl (ref 78.0–100.0)
Monocytes Absolute: 0.5 10*3/uL (ref 0.1–1.0)
Monocytes Relative: 6.9 % (ref 3.0–12.0)
Neutro Abs: 4.8 10*3/uL (ref 1.4–7.7)
Neutrophils Relative %: 67.3 % (ref 43.0–77.0)
Platelets: 255 10*3/uL (ref 150.0–400.0)
RBC: 5.57 Mil/uL — ABNORMAL HIGH (ref 3.87–5.11)
RDW: 13.2 % (ref 11.5–15.5)
WBC: 7.1 10*3/uL (ref 4.0–10.5)

## 2013-12-03 LAB — LIPID PANEL
Cholesterol: 187 mg/dL (ref 0–200)
HDL: 47.8 mg/dL (ref >39.00)
LDL Cholesterol: 108 mg/dL — ABNORMAL HIGH (ref 0–99)
NonHDL: 139.2
Total CHOL/HDL Ratio: 4
Triglycerides: 155 mg/dL — ABNORMAL HIGH (ref 0.0–149.0)
VLDL: 31 mg/dL (ref 0.0–40.0)

## 2013-12-03 LAB — TSH: TSH: 0.63 u[IU]/mL (ref 0.35–4.50)

## 2013-12-03 LAB — MICROALBUMIN / CREATININE URINE RATIO
Creatinine,U: 229.5 mg/dL
Microalb Creat Ratio: 0.3 mg/g (ref 0.0–30.0)
Microalb, Ur: 0.8 mg/dL (ref 0.0–1.9)

## 2013-12-03 LAB — VITAMIN D 25 HYDROXY (VIT D DEFICIENCY, FRACTURES): VITD: 34.86 ng/mL (ref 30.00–100.00)

## 2013-12-03 MED ORDER — GABAPENTIN 100 MG PO CAPS: 100.0000 mg | ORAL_CAPSULE | Freq: Four times a day (QID) | ORAL | Status: DC

## 2013-12-03 NOTE — Assessment & Plan Note (Signed)
General medical exam including breast exam normal today. PAP and pelvic deferred given pt age and preference. Mammogram and bone density testing ordered. Colonoscopy is UTD. Prevnar given today. Flu vaccine to be obtained at her local pharmacy. Labs today including CBC, CMP, lipids, TSH.

## 2013-12-03 NOTE — Assessment & Plan Note (Signed)
Wt Readings from Last 3 Encounters:  12/03/13 196 lb 4 oz (89.018 kg)  10/01/13 197 lb 8 oz (89.585 kg)  08/22/13 199 lb 8 oz (90.493 kg)   Body mass index is 31.2 kg/(m^2). Encouraged healthy diet and exercise with goal of weight loss.

## 2013-12-03 NOTE — Progress Notes (Signed)
Pre visit review using our clinic review tool, if applicable. No additional management support is needed unless otherwise documented below in the visit note. 

## 2013-12-03 NOTE — Progress Notes (Signed)
Subjective:    Patient ID: Alexandra Bishop, female    DOB: 1945-07-12, 68 y.o.   MRN: [id number]  HPI The patient is here for annual Medicare wellness examination and management of other chronic and acute problems.   The risk factors are reflected in the social history.  The roster of all physicians providing medical care to patient - is listed in the Snapshot section of the chart.  Activities of daily living:  The patient is 100% independent in all ADLs: dressing, toileting, feeding as well as independent mobility. Lives in a 1 story home with husband. No pets.  Home safety : The patient has smoke detectors in the home. They wear seatbelts.  There are no firearms at home. There is no violence in the home.   There is no risks for hepatitis, STDs or HIV. There is no history of blood transfusion. They have no travel history to infectious disease endemic areas of the world.  The patient has seen their dentist in the last six month. Dentist - [facility] They have seen their eye doctor in the last year. Opthalmology - [facility] No issues with hearing.  They have deferred audiologic testing in the last year.   They do not  have excessive sun exposure. Discussed the need for sun protection: hats, long sleeves and use of sunscreen if there is significant sun exposure. Dermatologist - Dr. Phillip Heal Podiatrist - Dr. Milinda Pointer Vascular - Dr. Delana Meyer  Diet: the importance of a healthy diet is discussed. They do have a healthy diet.  The benefits of regular aerobic exercise were discussed. She generally walks and goes to the [facility] to work out with a Clinical research associate.  Depression screen: there are no signs or vegative symptoms of depression- irritability, change in appetite, anhedonia, sadness/tearfullness.  Cognitive assessment: the patient manages all their financial and personal affairs and is actively engaged. They could relate day,date,year and events.  The following portions of the patient's history were  reviewed and updated as appropriate: allergies, current medications, past family history, past medical history,  past surgical history, past social history  and problem list.  Visual acuity was not assessed per patient preference since she has regular follow up with her ophthalmologist. Hearing and body mass index were assessed and reviewed.   During the course of the visit the patient was educated and counseled about appropriate screening and preventive services including : fall prevention , diabetes screening, nutrition counseling, colorectal cancer screening, and recommended immunizations.       Review of Systems  Constitutional: Positive for fatigue. Negative for fever, chills, appetite change and unexpected weight change.  Eyes: Negative for visual disturbance.  Respiratory: Negative for shortness of breath.   Cardiovascular: Negative for chest pain and leg swelling.  Gastrointestinal: Negative for nausea, vomiting, abdominal pain, diarrhea and constipation.  Musculoskeletal: Positive for arthralgias and myalgias.  Skin: Negative for color change and rash.  Hematological: Negative for adenopathy. Does not bruise/bleed easily.  Psychiatric/Behavioral: Positive for sleep disturbance. Negative for dysphoric mood. The patient is not nervous/anxious.        Objective:    BP 124/84  Pulse 64  Temp(Src) 98.2 F (36.8 C) (Oral)  Ht 5' 6.5" (1.689 m)  Wt 196 lb 4 oz (89.018 kg)  BMI 31.20 kg/m2  SpO2 97% Physical Exam  Constitutional: She is oriented to person, place, and time. She appears well-developed and well-nourished. No distress.  HENT:  Head: Normocephalic and atraumatic.  Right Ear: External ear normal.  Left  Ear: External ear normal.  Nose: Nose normal.  Mouth/Throat: Oropharynx is clear and moist. No oropharyngeal exudate.  Eyes: Conjunctivae are normal. Pupils are equal, round, and reactive to light. Right eye exhibits no discharge. Left eye exhibits no discharge. No  scleral icterus.  Neck: Normal range of motion. Neck supple. No tracheal deviation present. No thyromegaly present.  Cardiovascular: Normal rate, regular rhythm, normal heart sounds and intact distal pulses.  Exam reveals no gallop and no friction rub.   No murmur heard. Pulmonary/Chest: Effort normal and breath sounds normal. No accessory muscle usage. Not tachypneic. No respiratory distress. She has no decreased breath sounds. She has no wheezes. She has no rales. She exhibits no tenderness. Right breast exhibits no inverted nipple, no mass, no nipple discharge, no skin change and no tenderness. Left breast exhibits no inverted nipple, no mass, no nipple discharge, no skin change and no tenderness. Breasts are symmetrical.  Abdominal: Soft. Bowel sounds are normal. She exhibits no distension and no mass. There is no tenderness. There is no rebound and no guarding.  Musculoskeletal: Normal range of motion. She exhibits no edema and no tenderness.  Lymphadenopathy:    She has no cervical adenopathy.  Neurological: She is alert and oriented to person, place, and time. No cranial nerve deficit. She exhibits normal muscle tone. Coordination normal.  Skin: Skin is warm and dry. No rash noted. She is not diaphoretic. No erythema. No pallor.  Psychiatric: She has a normal mood and affect. Her behavior is normal. Judgment and thought content normal.          Assessment & Plan:   Problem List Items Addressed This Visit     Unprioritized   Medicare annual wellness visit, subsequent - Primary     General medical exam including breast exam normal today. PAP and pelvic deferred given pt age and preference. Mammogram and bone density testing ordered. Colonoscopy is UTD. Prevnar given today. Flu vaccine to be obtained at her local pharmacy. Labs today including CBC, CMP, lipids, TSH.    Relevant Orders      TSH      CBC with Differential      Comprehensive metabolic panel      Lipid panel      Vit D   25 hydroxy (rtn osteoporosis monitoring)      Microalbumin / creatinine urine ratio      MM Digital Screening   Obesity      Wt Readings from Last 3 Encounters:  12/03/13 196 lb 4 oz (89.018 kg)  10/01/13 197 lb 8 oz (89.585 kg)  08/22/13 199 lb 8 oz (90.493 kg)   Body mass index is 31.2 kg/(m^2). Encouraged healthy diet and exercise with goal of weight loss.    Postmenopausal estrogen deficiency   Relevant Orders      DG Bone Density       Return in about 6 months (around 06/03/2014).

## 2013-12-03 NOTE — Patient Instructions (Signed)

## 2013-12-03 NOTE — Addendum Note (Signed)
Addended by: Vernetta Honey on: 12/03/2013 02:23 PM   Modules accepted: Orders

## 2013-12-12 ENCOUNTER — Ambulatory Visit (INDEPENDENT_AMBULATORY_CARE_PROVIDER_SITE_OTHER): Payer: Medicare Other | Admitting: Podiatry

## 2013-12-12 ENCOUNTER — Encounter: Payer: Self-pay | Admitting: Podiatry

## 2013-12-12 VITALS — BP 80/67 | HR 67 | Resp 16

## 2013-12-12 DIAGNOSIS — M21619 Bunion of unspecified foot: Secondary | ICD-10-CM

## 2013-12-12 DIAGNOSIS — M21621 Bunionette of right foot: Secondary | ICD-10-CM

## 2013-12-13 NOTE — Progress Notes (Signed)
She presents today with a chief complaint of a painful Taylor's bunion of her right foot. She states that I have tried ago his lungs I can about having this fixed it now it is starting to affect my ability to perform her daily activities and it hurts to wear shoe gear.  Objective: Vital signs are stable she is alert and oriented x3. We have reviewed her past medications medical history allergies surgeries and social history. Pulses are strongly palpable bilateral. Neurologic sensorium is intact per since once the monofilament. Degenerative flexor intact bilateral muscle strength is 5 over 5 dorsiflexors plantar flexors inverters everters all intrinsic musculature is intact. Orthopedic evaluation does demonstrate a very mild hallux abductovalgus deformity but a moderate to severe Taylor's bunion deformity of the right foot. Upon reviewing the radiographs it does demonstrate a lateral bowing of the fifth metatarsal the right foot. With this lateral bowing she has adduction of the fifth toe toward the fourth toe. The joint itself appears to be dislocated and painful. Though she does have good range of motion of the fifth toe at the metatarsophalangeal joint.  Assessment: Painful Taylor's bunion deformity with right.  Plan: At this point a mid diaphyseal fifth metatarsal osteotomy closing wedge will be best for her in order to reduce this severe Taylor's bunion deformity. We went over consent form today line bylined number by number giving her a time to ask questions she saw fit regarding this procedure she also understands that she will have a below-knee cast on for a period of 6 weeks. I answered all the questions regarding these procedures to the best of my ability in layman's terms. We did discuss a possible postop complications which may include but are not limited to postop pain bleeding swelling infection need for further surgery. Over correction under correction also digit loss of limb loss of life chronic  pain and numbness. She signed all 3 pages of the consent form and I will followup with her in the near future for surgical intervention.

## 2013-12-14 DIAGNOSIS — Z23 Encounter for immunization: Secondary | ICD-10-CM | POA: Diagnosis not present

## 2013-12-18 ENCOUNTER — Telehealth: Payer: Self-pay | Admitting: *Deleted

## 2013-12-18 NOTE — Telephone Encounter (Signed)
Labs from recent visit were stable. MyChart message was sent. She is fine to proceed with surgery.

## 2013-12-18 NOTE — Telephone Encounter (Signed)
Pt wanted to get lab results from last visit and she wanted to let you know that she is having surgery on her bunion on Oct 9th

## 2013-12-19 NOTE — Telephone Encounter (Signed)
Notified pt. 

## 2013-12-20 ENCOUNTER — Telehealth: Payer: Self-pay | Admitting: *Deleted

## 2013-12-20 NOTE — Telephone Encounter (Signed)
Pt states she is wanting a healthcare nurse to come to her home one time after surgery to make sure she is doing ok. What and whom do you suggest?

## 2013-12-20 NOTE — Telephone Encounter (Signed)
Pt called left message stating she has questions about surgery on 10.9.15. Called and left message for pt to return my call.

## 2013-12-24 ENCOUNTER — Telehealth: Payer: Self-pay | Admitting: *Deleted

## 2013-12-24 NOTE — Telephone Encounter (Signed)
There is no need for a home health care nurse to come out.  One of our nurses will call and then she will follow up with me Wednesday after surgery.

## 2013-12-24 NOTE — Telephone Encounter (Signed)
CALLED AND SPOKE WITH PT LETTING HER KNOW PER DR HYATT THERE IS NO NEED FOR A HOME HEALTH CARE NURSE TO COME OUT AND SURGERY CENTER WILL CALL AND CHECK ON HER AFTER SURGERY. PT STATED " i WILL SEE HIM THIS MORNING AND TALK MORE ABOUT IT".

## 2013-12-26 ENCOUNTER — Other Ambulatory Visit: Payer: Self-pay | Admitting: Podiatry

## 2013-12-26 MED ORDER — MEPERIDINE HCL 50 MG PO TABS: ORAL_TABLET | ORAL | Status: DC

## 2013-12-26 MED ORDER — CEPHALEXIN 500 MG PO CAPS
500.0000 mg | ORAL_CAPSULE | Freq: Three times a day (TID) | ORAL | Status: DC
Start: 2013-12-26 — End: 2014-01-21

## 2013-12-26 MED ORDER — PROMETHAZINE HCL 25 MG PO TABS: 25.0000 mg | ORAL_TABLET | Freq: Three times a day (TID) | ORAL | Status: DC | PRN

## 2013-12-28 ENCOUNTER — Encounter: Payer: Self-pay | Admitting: *Deleted

## 2013-12-28 DIAGNOSIS — M21541 Acquired clubfoot, right foot: Secondary | ICD-10-CM | POA: Diagnosis not present

## 2013-12-28 DIAGNOSIS — M205X1 Other deformities of toe(s) (acquired), right foot: Secondary | ICD-10-CM | POA: Diagnosis not present

## 2013-12-28 DIAGNOSIS — I10 Essential (primary) hypertension: Secondary | ICD-10-CM | POA: Diagnosis not present

## 2013-12-28 DIAGNOSIS — M2011 Hallux valgus (acquired), right foot: Secondary | ICD-10-CM | POA: Diagnosis not present

## 2014-01-02 ENCOUNTER — Ambulatory Visit (INDEPENDENT_AMBULATORY_CARE_PROVIDER_SITE_OTHER): Payer: Medicare Other | Admitting: Podiatry

## 2014-01-02 ENCOUNTER — Ambulatory Visit (INDEPENDENT_AMBULATORY_CARE_PROVIDER_SITE_OTHER): Payer: Medicare Other

## 2014-01-02 VITALS — BP 122/73 | HR 80 | Temp 98.0°F | Resp 16

## 2014-01-02 DIAGNOSIS — M21611 Bunion of right foot: Secondary | ICD-10-CM

## 2014-01-02 DIAGNOSIS — Z9889 Other specified postprocedural states: Secondary | ICD-10-CM

## 2014-01-02 DIAGNOSIS — M2011 Hallux valgus (acquired), right foot: Secondary | ICD-10-CM | POA: Diagnosis not present

## 2014-01-02 NOTE — Progress Notes (Signed)
   Subjective:    Patient ID: Alexandra Bishop, female    DOB: [birth year], 68 y.o.   MRN: [id number]  HPI 5th metatarsal osteotomy right foot ( mid diaphyseal)  Application of cast   Review of Systems     Objective:   Physical Exam        Assessment & Plan:

## 2014-01-02 NOTE — Progress Notes (Signed)
She presents today one week status post fifth metatarsal osteotomy and cast application right foot. She denies fever chills nausea vomiting muscle aches and pains. Date of surgery was 12/28/2013.  Objective: Vital signs are stable shortness x3 cast is intact. toes demonstrate immediate capillary fill time. The cast is loose around her calf but stable around the ankle. Radiographs demonstrate a good fifth metatarsal osteotomy in good position. He may have a very mild medial dislocation from where it was placed previously secondary to the patient stepping down on it accidentally.  Assessment: Fifth metatarsal osteotomy and cast application right foot. 1 week postop.  Plan: Followup with me in one week at which time the cast will be removed and the cast will be applied.

## 2014-01-09 ENCOUNTER — Ambulatory Visit (INDEPENDENT_AMBULATORY_CARE_PROVIDER_SITE_OTHER): Payer: Medicare Other | Admitting: Podiatry

## 2014-01-09 VITALS — BP 123/66 | HR 70 | Resp 16

## 2014-01-09 DIAGNOSIS — M2011 Hallux valgus (acquired), right foot: Secondary | ICD-10-CM

## 2014-01-09 DIAGNOSIS — Z9889 Other specified postprocedural states: Secondary | ICD-10-CM

## 2014-01-09 NOTE — Progress Notes (Signed)
She presents today nearly 2 weeks status post fifth metatarsal osteotomy right foot. Cast is intact.  Objective: Vital signs are stable she is alert and oriented x3. Cast was removed. Dry sterile dressing intact once removed demonstrates minimal edema no erythema cellulitis drainage or odor sutures are intact fifth metatarsal right foot. Pulses remain palpable.  Assessment: Status post fifth met osteotomy x2 weeks.  Plan: Reapply dressed a compressive dressing to the right foot today placed her in a Cam Walker nonweightbearing and I will followup with her in one week

## 2014-01-16 ENCOUNTER — Ambulatory Visit (INDEPENDENT_AMBULATORY_CARE_PROVIDER_SITE_OTHER): Payer: Medicare Other

## 2014-01-16 ENCOUNTER — Ambulatory Visit (INDEPENDENT_AMBULATORY_CARE_PROVIDER_SITE_OTHER): Payer: Medicare Other | Admitting: Podiatry

## 2014-01-16 VITALS — BP 124/66 | HR 67 | Temp 97.7°F | Resp 16

## 2014-01-16 DIAGNOSIS — M21611 Bunion of right foot: Secondary | ICD-10-CM

## 2014-01-16 DIAGNOSIS — M2011 Hallux valgus (acquired), right foot: Secondary | ICD-10-CM

## 2014-01-16 DIAGNOSIS — Z9889 Other specified postprocedural states: Secondary | ICD-10-CM

## 2014-01-16 NOTE — Progress Notes (Signed)
She presents today 3 weeks status post fifth metatarsal osteotomy. She denies fever chills nausea vomiting muscle aches and pains and accidents.  Objective: Vital signs are stable she is alert and oriented 3. Dry sterile dressing was removed demonstrates mild edema no erythema saline as drainage or odor margins are well coapted. Sutures are intact.  Assessment: Well-healing surgical foot status post fifth metatarsal osteotomy right.  Plan: Removal of sutures right foot. Margins remain well coapted. I will allow her to start washing this and then soaking it in Betadine and water or Epsom salts and warm water. I will follow-up with her in 2 weeks. Radiographs will be taken at that time I will follow-up with her for Darco shoe at that time a compression anklet was dispensed today.

## 2014-01-18 ENCOUNTER — Telehealth: Payer: Self-pay | Admitting: Internal Medicine

## 2014-01-18 NOTE — Telephone Encounter (Signed)
See below note.

## 2014-01-18 NOTE — Telephone Encounter (Signed)
Pt schedule 11.2.15 at 4pm per Dr Gilford Rile

## 2014-01-18 NOTE — Telephone Encounter (Signed)
We should see her to evaluate.

## 2014-01-18 NOTE — Telephone Encounter (Signed)
Alexandra Bishop called saying she's had pain underneath her Rt breast x's 1wk. It's uncomfortable especially at night. She's wondering if she can be seen or what Dr. Gilford Rile would suggest for her. Please call the patient. Pt ph# [phone] Thank you.

## 2014-01-21 ENCOUNTER — Encounter: Payer: Self-pay | Admitting: Internal Medicine

## 2014-01-21 ENCOUNTER — Telehealth: Payer: Self-pay | Admitting: *Deleted

## 2014-01-21 ENCOUNTER — Ambulatory Visit (INDEPENDENT_AMBULATORY_CARE_PROVIDER_SITE_OTHER): Payer: Medicare Other | Admitting: Internal Medicine

## 2014-01-21 VITALS — BP 120/70 | HR 61 | Temp 98.1°F | Resp 14 | Ht 66.5 in

## 2014-01-21 DIAGNOSIS — G4700 Insomnia, unspecified: Secondary | ICD-10-CM

## 2014-01-21 DIAGNOSIS — S20219A Contusion of unspecified front wall of thorax, initial encounter: Secondary | ICD-10-CM | POA: Insufficient documentation

## 2014-01-21 DIAGNOSIS — S20211A Contusion of right front wall of thorax, initial encounter: Secondary | ICD-10-CM | POA: Diagnosis not present

## 2014-01-21 NOTE — Progress Notes (Signed)
Subjective:    Patient ID: Alexandra Bishop, female    DOB: 19-Oct-1945, 68 y.o.   MRN: [id number]  HPI 68YO female presents for acute visit.  Right breast pain - Started 1 week ago. Leaned over a chair forcefully. Developed pain behind right breast. Pain seems to be improving. No pain with respiration. No bruising noted over skin.  She has tapered off her Ambien. She is sleeping well but does not feel refreshed in the mornings. She has been evaluated in the very distant past for sleep apnea, but is unsure of results.  She recently underwent surgery on her foot. Osteopenia was noted. She would like to schedule her bone density test.  Review of Systems  Constitutional: Positive for fatigue. Negative for fever, chills, appetite change and unexpected weight change.  Eyes: Negative for visual disturbance.  Respiratory: Negative for shortness of breath.   Cardiovascular: Positive for chest pain. Negative for leg swelling.  Gastrointestinal: Negative for abdominal pain.  Musculoskeletal: Positive for myalgias and arthralgias.  Skin: Negative for color change and rash.  Hematological: Negative for adenopathy. Does not bruise/bleed easily.  Psychiatric/Behavioral: Positive for sleep disturbance. Negative for dysphoric mood. The patient is not nervous/anxious.        Objective:    BP 120/70 mmHg  Pulse 61  Temp(Src) 98.1 F (36.7 C) (Oral)  Resp 14  Ht 5' 6.5" (1.689 m)  Wt   SpO2 96% Physical Exam  Constitutional: She is oriented to person, place, and time. She appears well-developed and well-nourished. No distress.  HENT:  Head: Normocephalic and atraumatic.  Right Ear: External ear normal.  Left Ear: External ear normal.  Nose: Nose normal.  Mouth/Throat: Oropharynx is clear and moist. No oropharyngeal exudate.  Eyes: Conjunctivae are normal. Pupils are equal, round, and reactive to light. Right eye exhibits no discharge. Left eye exhibits no discharge. No scleral icterus.  Neck:  Normal range of motion. Neck supple. No tracheal deviation present. No thyromegaly present.  Cardiovascular: Normal rate, regular rhythm, normal heart sounds and intact distal pulses.  Exam reveals no gallop and no friction rub.   No murmur heard. Pulmonary/Chest: Effort normal and breath sounds normal. No accessory muscle usage. No tachypnea. No respiratory distress. She has no decreased breath sounds. She has no wheezes. She has no rhonchi. She has no rales. She exhibits no tenderness. Right breast exhibits no inverted nipple, no mass, no nipple discharge, no skin change and no tenderness. Breasts are symmetrical.    Musculoskeletal: Normal range of motion. She exhibits no edema or tenderness.  Right ankle in air cast  Lymphadenopathy:    She has no cervical adenopathy.  Neurological: She is alert and oriented to person, place, and time. No cranial nerve deficit. She exhibits normal muscle tone. Coordination normal.  Skin: Skin is warm and dry. No rash noted. She is not diaphoretic. No erythema. No pallor.  Psychiatric: She has a normal mood and affect. Her behavior is normal. Judgment and thought content normal.          Assessment & Plan:   Problem List Items Addressed This Visit      Unprioritized   Insomnia    Symptoms of insomnia well controlled after tapering off Ambien, however continues to wake unrefreshed. We discussed setting up a sleep study, but she prefers to hold off for now.    Rib contusion - Primary    Symptoms and exam consistent with rib contusion. Recommended icing as needed, rest, and prn Neurontin. Pt  will call if symptoms do not continue to improve.        Return if symptoms worsen or fail to improve.

## 2014-01-21 NOTE — Telephone Encounter (Signed)
Pt called stated she is having rt foot pain, and rt heel pain. Said she gave herself a salt bath. Pt stated the tight sock we gave her she does not like. States she a prescription pair of tight socks and wanted to know if she could wear those in place of our tight sock. i told pt she could wear her prescription socks, cont with salt bath, elevate, stay off of foot and on her next appt we could look at her rt heel pain.

## 2014-01-21 NOTE — Assessment & Plan Note (Signed)
Symptoms and exam consistent with rib contusion. Recommended icing as needed, rest, and prn Neurontin. Pt will call if symptoms do not continue to improve.

## 2014-01-21 NOTE — Patient Instructions (Signed)
We will schedule your mammogram and bone density testing.  Continue the Gabapentin as needed for pain.

## 2014-01-21 NOTE — Assessment & Plan Note (Signed)
Symptoms of insomnia well controlled after tapering off Ambien, however continues to wake unrefreshed. We discussed setting up a sleep study, but she prefers to hold off for now.

## 2014-01-21 NOTE — Progress Notes (Signed)
Pre visit review using our clinic review tool, if applicable. No additional management support is needed unless otherwise documented below in the visit note. 

## 2014-01-22 ENCOUNTER — Ambulatory Visit: Payer: 59 | Admitting: Internal Medicine

## 2014-01-28 ENCOUNTER — Other Ambulatory Visit: Payer: Self-pay | Admitting: Internal Medicine

## 2014-01-30 ENCOUNTER — Ambulatory Visit (INDEPENDENT_AMBULATORY_CARE_PROVIDER_SITE_OTHER): Payer: Medicare Other

## 2014-01-30 ENCOUNTER — Ambulatory Visit (INDEPENDENT_AMBULATORY_CARE_PROVIDER_SITE_OTHER): Payer: Medicare Other | Admitting: Podiatry

## 2014-01-30 VITALS — BP 118/66 | HR 77 | Resp 16

## 2014-01-30 DIAGNOSIS — M2041 Other hammer toe(s) (acquired), right foot: Secondary | ICD-10-CM

## 2014-01-30 NOTE — Progress Notes (Signed)
She presents today one month status post fifth metatarsal osteotomy right foot. She presents today with her Cam Walker on in a wheelchair and a compression anklet. She states that the foot feels pretty good but she has not been walking on it.  Objective: Vital signs are stable she is alert and oriented 3. No erythema and no edema cellulitis drainage or odor. The foot appears to be healing well. Radiographic evaluation demonstrates well-healing surgical foot.  Assessment: Well-healing surgical foot right.  Plan: Discussed etiology pathology conservative versus surgical therapies. Dispensed a Darco shoe to which she will transition over the next 2 weeks. I will follow-up with her in 2 weeks.

## 2014-02-15 ENCOUNTER — Other Ambulatory Visit: Payer: Self-pay | Admitting: Internal Medicine

## 2014-02-18 ENCOUNTER — Ambulatory Visit (INDEPENDENT_AMBULATORY_CARE_PROVIDER_SITE_OTHER): Payer: Medicare Other

## 2014-02-18 ENCOUNTER — Encounter: Payer: Self-pay | Admitting: Podiatry

## 2014-02-18 ENCOUNTER — Ambulatory Visit (INDEPENDENT_AMBULATORY_CARE_PROVIDER_SITE_OTHER): Payer: Medicare Other | Admitting: Podiatry

## 2014-02-18 DIAGNOSIS — Z9889 Other specified postprocedural states: Secondary | ICD-10-CM

## 2014-02-18 DIAGNOSIS — M2041 Other hammer toe(s) (acquired), right foot: Secondary | ICD-10-CM

## 2014-02-18 NOTE — Progress Notes (Signed)
She presents today 6 weeks status post fifth metatarsal osteotomy right foot. She still presents with crutches and regular shoe gear.  Objective: Vital signs are stable she is alert and oriented 3 mild erythema overlying the fifth metatarsal base of the right foot. Radiographic evaluation demonstrated well healing surgical fifth met osteotomy.  Assessment: Well-healing surgical foot right.  Plan: Get back to her regular shoe gear. No bare feet. I would like for her to stop utilizing the crutches. I will follow up with her in 1 month.

## 2014-03-20 ENCOUNTER — Ambulatory Visit: Payer: Medicare Other | Admitting: Podiatry

## 2014-03-22 DIAGNOSIS — M545 Low back pain: Secondary | ICD-10-CM | POA: Diagnosis not present

## 2014-03-25 ENCOUNTER — Telehealth: Payer: Self-pay | Admitting: Internal Medicine

## 2014-03-25 NOTE — Telephone Encounter (Signed)
Please see below.

## 2014-03-25 NOTE — Telephone Encounter (Signed)
Pt notified and verbalized understanding.

## 2014-03-25 NOTE — Telephone Encounter (Signed)
Her orthopedic surgeon should order any PT

## 2014-03-25 NOTE — Telephone Encounter (Signed)
Pt is having back pain post [place] spine surgery. Pt request to physical therapy. Please advise.msn

## 2014-03-26 ENCOUNTER — Telehealth: Payer: Self-pay | Admitting: *Deleted

## 2014-03-26 ENCOUNTER — Encounter: Payer: Self-pay | Admitting: *Deleted

## 2014-03-26 ENCOUNTER — Ambulatory Visit: Payer: Self-pay | Admitting: Internal Medicine

## 2014-03-26 DIAGNOSIS — M8580 Other specified disorders of bone density and structure, unspecified site: Secondary | ICD-10-CM | POA: Diagnosis not present

## 2014-03-26 DIAGNOSIS — M8589 Other specified disorders of bone density and structure, multiple sites: Secondary | ICD-10-CM | POA: Diagnosis not present

## 2014-03-26 DIAGNOSIS — E2839 Other primary ovarian failure: Secondary | ICD-10-CM | POA: Diagnosis not present

## 2014-03-26 DIAGNOSIS — N959 Unspecified menopausal and perimenopausal disorder: Secondary | ICD-10-CM | POA: Diagnosis not present

## 2014-03-26 DIAGNOSIS — Z13820 Encounter for screening for osteoporosis: Secondary | ICD-10-CM | POA: Diagnosis not present

## 2014-03-26 LAB — HM DEXA SCAN

## 2014-03-26 NOTE — Telephone Encounter (Signed)
Spoke to patient regarding physical therapy, she is not needing it for her foot, she is needing it for the back pain that she is having.  Will contact her primary doctor regarding her therapy.

## 2014-03-26 NOTE — Telephone Encounter (Signed)
Pt called again requesting referral for PT.

## 2014-03-26 NOTE — Telephone Encounter (Signed)
Pt would like a prescription for Physical Therapy.

## 2014-03-27 ENCOUNTER — Telehealth: Payer: Self-pay | Admitting: Internal Medicine

## 2014-03-27 NOTE — Telephone Encounter (Signed)
Left vm for pt to return my call.  

## 2014-03-27 NOTE — Telephone Encounter (Signed)
We will need to see her in a visit to document symptoms, and exam findings to order PT. 4min

## 2014-03-27 NOTE — Telephone Encounter (Signed)
Bone density testing shows osteopenia with T-score -1.6. We can plan to discuss treatment at next visit.

## 2014-03-27 NOTE — Telephone Encounter (Signed)
Sent my chart with results. 

## 2014-03-27 NOTE — Telephone Encounter (Signed)
The patient is wanting a referral to [facility] for Lumbar spinal stenosis ,hip pain.

## 2014-03-27 NOTE — Telephone Encounter (Signed)
Patient returned your call.

## 2014-03-28 NOTE — Telephone Encounter (Signed)
Next 1-2 weeks is fine.

## 2014-03-28 NOTE — Telephone Encounter (Signed)
Do you want this patient worked into your schedule?  If so, where?   Thanks!

## 2014-03-29 ENCOUNTER — Ambulatory Visit (INDEPENDENT_AMBULATORY_CARE_PROVIDER_SITE_OTHER): Payer: Medicare Other | Admitting: Internal Medicine

## 2014-03-29 ENCOUNTER — Encounter: Payer: Self-pay | Admitting: Internal Medicine

## 2014-03-29 VITALS — BP 134/71 | HR 74 | Temp 98.0°F | Ht 66.5 in | Wt 208.5 lb

## 2014-03-29 DIAGNOSIS — M949 Disorder of cartilage, unspecified: Secondary | ICD-10-CM | POA: Insufficient documentation

## 2014-03-29 DIAGNOSIS — G8929 Other chronic pain: Secondary | ICD-10-CM

## 2014-03-29 DIAGNOSIS — M545 Low back pain, unspecified: Secondary | ICD-10-CM

## 2014-03-29 DIAGNOSIS — M85859 Other specified disorders of bone density and structure, unspecified thigh: Secondary | ICD-10-CM | POA: Insufficient documentation

## 2014-03-29 DIAGNOSIS — M8580 Other specified disorders of bone density and structure, unspecified site: Secondary | ICD-10-CM

## 2014-03-29 DIAGNOSIS — M899 Disorder of bone, unspecified: Secondary | ICD-10-CM | POA: Insufficient documentation

## 2014-03-29 NOTE — Assessment & Plan Note (Signed)
Reviewed previous MRI lumbar spine which showed DJD at multiple levels. She does not want to follow up with neurosurgery. Discussed option of evaluation with Dr. Sharlet Salina for ESI. She would like to hold off for now and restart PT. Order written for this. Will continue Neurontin. Follow up 3 months and prn.

## 2014-03-29 NOTE — Progress Notes (Signed)
Subjective:    Patient ID: Alexandra Bishop, female    DOB: [birth year], 69 y.o.   MRN: [id number]  HPI 68YO female presents for followup.  Having significant pain in right hip and lower back, consistent with history of lumbar spinal stenosis. Had PT in the past with improvement and would like to repeat if possible at [facility]. Planning to start back at [facility] and with swimming. Continues on Gabapentin 100mg  twice during the day and 200mg  at night.  Recovering from ankle surgery.  Fell at home when walking down front steps. Right leg gave out on her. Questions if lumbar spinal stenosis is playing a role.   Past medical, surgical, family and social history per today's encounter.  Review of Systems  Constitutional: Negative for fever, chills, appetite change, fatigue and unexpected weight change.  Eyes: Negative for visual disturbance.  Respiratory: Negative for shortness of breath.   Cardiovascular: Negative for chest pain and leg swelling.  Gastrointestinal: Negative for nausea, vomiting, abdominal pain, diarrhea and constipation.  Musculoskeletal: Positive for myalgias, back pain and arthralgias.  Skin: Negative for color change and rash.  Neurological: Positive for weakness. Negative for dizziness, tremors and light-headedness.  Hematological: Negative for adenopathy. Does not bruise/bleed easily.  Psychiatric/Behavioral: Negative for dysphoric mood. The patient is not nervous/anxious.        Objective:    BP 134/71 mmHg  Pulse 74  Temp(Src) 98 F (36.7 C) (Oral)  Ht 5' 6.5" (1.689 m)  Wt 208 lb 8 oz (94.575 kg)  BMI 33.15 kg/m2  SpO2 97% Physical Exam  Constitutional: She is oriented to person, place, and time. She appears well-developed and well-nourished. No distress.  HENT:  Head: Normocephalic and atraumatic.  Right Ear: External ear normal.  Left Ear: External ear normal.  Nose: Nose normal.  Mouth/Throat: Oropharynx is clear and moist. No oropharyngeal exudate.    Eyes: Conjunctivae are normal. Pupils are equal, round, and reactive to light. Right eye exhibits no discharge. Left eye exhibits no discharge. No scleral icterus.  Neck: Normal range of motion. Neck supple. No tracheal deviation present. No thyromegaly present.  Cardiovascular: Normal rate, regular rhythm, normal heart sounds and intact distal pulses.  Exam reveals no gallop and no friction rub.   No murmur heard. Pulmonary/Chest: Effort normal and breath sounds normal. No accessory muscle usage. No tachypnea. No respiratory distress. She has no decreased breath sounds. She has no wheezes. She has no rhonchi. She has no rales. She exhibits no tenderness.  Musculoskeletal: Normal range of motion. She exhibits no edema or tenderness.       Lumbar back: She exhibits pain. She exhibits normal range of motion and no tenderness.  Lymphadenopathy:    She has no cervical adenopathy.  Neurological: She is alert and oriented to person, place, and time. No cranial nerve deficit. She exhibits normal muscle tone. Coordination normal.  Skin: Skin is warm and dry. No rash noted. She is not diaphoretic. No erythema. No pallor.  Psychiatric: She has a normal mood and affect. Her behavior is normal. Judgment and thought content normal.          Assessment & Plan:   Problem List Items Addressed This Visit      Unprioritized   Chronic low back pain - Primary    Reviewed previous MRI lumbar spine which showed DJD at multiple levels. She does not want to follow up with neurosurgery. Discussed option of evaluation with Dr. Sharlet Salina for ESI. She would like to hold  off for now and restart PT. Order written for this. Will continue Neurontin. Follow up 3 months and prn.    Relevant Orders      Ambulatory referral to Physical Therapy   Osteopenia    Reviewed recent bone density results with pt. Encouraged adequate dietary intake of Calcium, and continued supplementation of Vit D. Recent Vit D level was normal at  34. Encouraged weight bearing activity and weight loss. Discussed adding bisphosphonate, however given her history of gastritis, will hold off for now. Plan repeat DEXA in 2017.        Return in about 3 months (around 06/28/2014) for Recheck.

## 2014-03-29 NOTE — Assessment & Plan Note (Signed)
Reviewed recent bone density results with pt. Encouraged adequate dietary intake of Calcium, and continued supplementation of Vit D. Recent Vit D level was normal at 34. Encouraged weight bearing activity and weight loss. Discussed adding bisphosphonate, however given her history of gastritis, will hold off for now. Plan repeat DEXA in 2017.

## 2014-03-29 NOTE — Patient Instructions (Signed)
We will set up PT at [facility].  Follow up if symptoms are not improving.

## 2014-03-29 NOTE — Progress Notes (Signed)
Pre visit review using our clinic review tool, if applicable. No additional management support is needed unless otherwise documented below in the visit note. 

## 2014-04-03 ENCOUNTER — Ambulatory Visit (INDEPENDENT_AMBULATORY_CARE_PROVIDER_SITE_OTHER): Payer: Medicare Other

## 2014-04-03 ENCOUNTER — Ambulatory Visit (INDEPENDENT_AMBULATORY_CARE_PROVIDER_SITE_OTHER): Payer: Medicare Other | Admitting: Podiatry

## 2014-04-03 VITALS — BP 127/67 | HR 68 | Resp 16

## 2014-04-03 DIAGNOSIS — M2041 Other hammer toe(s) (acquired), right foot: Secondary | ICD-10-CM | POA: Diagnosis not present

## 2014-04-03 DIAGNOSIS — Z9889 Other specified postprocedural states: Secondary | ICD-10-CM

## 2014-04-03 NOTE — Progress Notes (Signed)
She presents today for follow-up of her fifth metatarsal osteotomy right foot. She states this seems to be doing okay. It is still painful.  Objective: Vital signs are stable she is alert and oriented 3. Ulcers are palpable right. Minimal edema and no erythema cellulitis drainage or odor. Radiographic evaluation confirms slightly elongated screws but the fifth metatarsal head has normal heel. No fractures are identified.  Assessment: Well-healing surgical fifth metatarsal osteotomy right with screw fixation 2.  Plan: I encouraged her to get back to regular activity and I will follow-up with her in 2-3 months.

## 2014-04-09 DIAGNOSIS — M545 Low back pain: Secondary | ICD-10-CM | POA: Diagnosis not present

## 2014-04-10 DIAGNOSIS — K645 Perianal venous thrombosis: Secondary | ICD-10-CM | POA: Diagnosis not present

## 2014-04-10 NOTE — Telephone Encounter (Signed)
Discussed with patient at 03/29/14 visit.

## 2014-04-16 DIAGNOSIS — M545 Low back pain: Secondary | ICD-10-CM | POA: Diagnosis not present

## 2014-04-16 DIAGNOSIS — K644 Residual hemorrhoidal skin tags: Secondary | ICD-10-CM | POA: Diagnosis not present

## 2014-04-18 DIAGNOSIS — M545 Low back pain: Secondary | ICD-10-CM | POA: Diagnosis not present

## 2014-04-22 DIAGNOSIS — M545 Low back pain: Secondary | ICD-10-CM | POA: Diagnosis not present

## 2014-04-23 DIAGNOSIS — M545 Low back pain: Secondary | ICD-10-CM | POA: Diagnosis not present

## 2014-04-25 DIAGNOSIS — M545 Low back pain: Secondary | ICD-10-CM | POA: Diagnosis not present

## 2014-04-30 DIAGNOSIS — M545 Low back pain: Secondary | ICD-10-CM | POA: Diagnosis not present

## 2014-05-07 ENCOUNTER — Telehealth: Payer: Self-pay | Admitting: Internal Medicine

## 2014-05-07 ENCOUNTER — Other Ambulatory Visit: Payer: Self-pay | Admitting: Internal Medicine

## 2014-05-07 DIAGNOSIS — E669 Obesity, unspecified: Secondary | ICD-10-CM

## 2014-05-07 DIAGNOSIS — M545 Low back pain: Secondary | ICD-10-CM | POA: Diagnosis not present

## 2014-05-07 NOTE — Telephone Encounter (Signed)
Bonnita Nasuti, Can you set this up?

## 2014-05-07 NOTE — Telephone Encounter (Signed)
The patient would like a referral put in the system for lifestyles for weightloss.

## 2014-05-09 DIAGNOSIS — M545 Low back pain: Secondary | ICD-10-CM | POA: Diagnosis not present

## 2014-05-10 ENCOUNTER — Other Ambulatory Visit: Payer: Self-pay | Admitting: Internal Medicine

## 2014-05-10 NOTE — Telephone Encounter (Signed)
Need referral  ,  To fax to [facility]

## 2014-05-10 NOTE — Telephone Encounter (Signed)
Last OV 1.8.16, please advise refill

## 2014-05-12 ENCOUNTER — Other Ambulatory Visit: Payer: Self-pay | Admitting: Internal Medicine

## 2014-05-13 NOTE — Telephone Encounter (Signed)
OK to fill

## 2014-05-14 DIAGNOSIS — L400 Psoriasis vulgaris: Secondary | ICD-10-CM | POA: Diagnosis not present

## 2014-05-14 DIAGNOSIS — L821 Other seborrheic keratosis: Secondary | ICD-10-CM | POA: Diagnosis not present

## 2014-05-14 DIAGNOSIS — Z1283 Encounter for screening for malignant neoplasm of skin: Secondary | ICD-10-CM | POA: Diagnosis not present

## 2014-05-16 DIAGNOSIS — M545 Low back pain: Secondary | ICD-10-CM | POA: Diagnosis not present

## 2014-05-21 DIAGNOSIS — M545 Low back pain: Secondary | ICD-10-CM | POA: Diagnosis not present

## 2014-05-23 DIAGNOSIS — M545 Low back pain: Secondary | ICD-10-CM | POA: Diagnosis not present

## 2014-05-24 ENCOUNTER — Ambulatory Visit
Admit: 2014-05-24 | Disposition: A | Discharge status: Home / Self Care | Payer: Self-pay | Attending: Internal Medicine | Admitting: Internal Medicine

## 2014-05-24 DIAGNOSIS — Z713 Dietary counseling and surveillance: Secondary | ICD-10-CM | POA: Diagnosis not present

## 2014-05-24 DIAGNOSIS — E669 Obesity, unspecified: Secondary | ICD-10-CM | POA: Diagnosis not present

## 2014-05-28 DIAGNOSIS — M545 Low back pain: Secondary | ICD-10-CM | POA: Diagnosis not present

## 2014-05-29 DIAGNOSIS — K648 Other hemorrhoids: Secondary | ICD-10-CM | POA: Diagnosis not present

## 2014-05-30 DIAGNOSIS — M545 Low back pain: Secondary | ICD-10-CM | POA: Diagnosis not present

## 2014-06-04 ENCOUNTER — Encounter: Payer: Self-pay | Admitting: Internal Medicine

## 2014-06-04 ENCOUNTER — Ambulatory Visit (INDEPENDENT_AMBULATORY_CARE_PROVIDER_SITE_OTHER): Payer: Medicare Other | Admitting: Internal Medicine

## 2014-06-04 VITALS — BP 132/67 | HR 67 | Temp 97.9°F | Ht 66.5 in | Wt 209.2 lb

## 2014-06-04 DIAGNOSIS — M545 Low back pain, unspecified: Secondary | ICD-10-CM

## 2014-06-04 DIAGNOSIS — G8929 Other chronic pain: Secondary | ICD-10-CM | POA: Diagnosis not present

## 2014-06-04 DIAGNOSIS — I872 Venous insufficiency (chronic) (peripheral): Secondary | ICD-10-CM | POA: Diagnosis not present

## 2014-06-04 DIAGNOSIS — E669 Obesity, unspecified: Secondary | ICD-10-CM

## 2014-06-04 DIAGNOSIS — E039 Hypothyroidism, unspecified: Secondary | ICD-10-CM

## 2014-06-04 NOTE — Patient Instructions (Addendum)
We will set up referrals to endocrinology and orthopedics (Dr. Sharlet Salina).     Why follow it? Research shows. . Those who follow the Mediterranean diet have a reduced risk of heart disease  . The diet is associated with a reduced incidence of Parkinson's and Alzheimer's diseases . People following the diet may have longer life expectancies and lower rates of chronic diseases  . The Dietary Guidelines for Americans recommends the Mediterranean diet as an eating plan to promote health and prevent disease  What Is the Mediterranean Diet?  . Healthy eating plan based on typical foods and recipes of Mediterranean-style cooking . The diet is primarily a plant based diet; these foods should make up a majority of meals   Starches - Plant based foods should make up a majority of meals - They are an important sources of vitamins, minerals, energy, antioxidants, and fiber - Choose whole grains, foods high in fiber and minimally processed items  - Typical grain sources include wheat, oats, barley, corn, brown rice, bulgar, farro, millet, polenta, couscous  - Various types of beans include chickpeas, lentils, fava beans, black beans, white beans   Fruits  Veggies - Large quantities of antioxidant rich fruits & veggies; 6 or more servings  - Vegetables can be eaten raw or lightly drizzled with oil and cooked  - Vegetables common to the traditional Mediterranean Diet include: artichokes, arugula, beets, broccoli, brussel sprouts, cabbage, carrots, celery, collard greens, cucumbers, eggplant, kale, leeks, lemons, lettuce, mushrooms, okra, onions, peas, peppers, potatoes, pumpkin, radishes, rutabaga, shallots, spinach, sweet potatoes, turnips, zucchini - Fruits common to the Mediterranean Diet include: apples, apricots, avocados, cherries, clementines, dates, figs, grapefruits, grapes, melons, nectarines, oranges, peaches, pears, pomegranates, strawberries, tangerines  Fats - Replace butter and margarine with  healthy oils, such as olive oil, canola oil, and tahini  - Limit nuts to no more than a handful a day  - Nuts include walnuts, almonds, pecans, pistachios, pine nuts  - Limit or avoid candied, honey roasted or heavily salted nuts - Olives are central to the [employer] - can be eaten whole or used in a variety of dishes   Meats Protein - Limiting red meat: no more than a few times a month - When eating red meat: choose lean cuts and keep the portion to the size of deck of cards - Eggs: approx. 0 to 4 times a week  - Fish and lean poultry: at least 2 a week  - Healthy protein sources include, chicken, [place], lean beef, lamb - Increase intake of seafood such as tuna, salmon, trout, mackerel, shrimp, scallops - Avoid or limit high fat processed meats such as sausage and bacon  Dairy - Include moderate amounts of low fat dairy products  - Focus on healthy dairy such as fat free yogurt, skim milk, low or reduced fat cheese - Limit dairy products higher in fat such as whole or 2% milk, cheese, ice cream  Alcohol - Moderate amounts of red wine is ok  - No more than 5 oz daily for women (all ages) and men older than age 4  - No more than 10 oz of wine daily for men younger than 64  Other - Limit sweets and other desserts  - Use herbs and spices instead of salt to flavor foods  - Herbs and spices common to the traditional Mediterranean Diet include: basil, bay leaves, chives, cloves, cumin, fennel, garlic, lavender, marjoram, mint, oregano, parsley, pepper, rosemary, sage, savory, sumac, tarragon, thyme  It's not just a diet, it's a lifestyle:  . The Mediterranean diet includes lifestyle factors typical of those in the region  . Foods, drinks and meals are best eaten with others and savored . Daily physical activity is important for overall good health . This could be strenuous exercise like running and aerobics . This could also be more leisurely activities such as walking, housework,  yard-work, or taking the stairs . Moderation is the key; a balanced and healthy diet accommodates most foods and drinks . Consider portion sizes and frequency of consumption of certain foods   Meal Ideas & Options:  . Breakfast:  o Whole wheat toast or whole wheat English muffins with peanut butter & hard boiled egg o Steel cut oats topped with apples & cinnamon and skim milk  o Fresh fruit: banana, strawberries, melon, berries, peaches  o Smoothies: strawberries, bananas, greek yogurt, peanut butter o Low fat greek yogurt with blueberries and granola  o Egg white omelet with spinach and mushrooms o Breakfast couscous: whole wheat couscous, apricots, skim milk, cranberries  . Sandwiches:  o Hummus and grilled vegetables (peppers, zucchini, squash) on whole wheat bread   o Grilled chicken on whole wheat pita with lettuce, tomatoes, cucumbers or tzatziki  o Tuna salad on whole wheat bread: tuna salad made with greek yogurt, olives, red peppers, capers, green onions o Garlic rosemary lamb pita: lamb sauted with garlic, rosemary, salt & pepper; add lettuce, cucumber, greek yogurt to pita - flavor with lemon juice and black pepper  . Seafood:  o Mediterranean grilled salmon, seasoned with garlic, basil, parsley, lemon juice and black pepper o Shrimp, lemon, and spinach whole-grain pasta salad made with low fat greek yogurt  o Seared scallops with lemon orzo  o Seared tuna steaks seasoned salt, pepper, coriander topped with tomato mixture of olives, tomatoes, olive oil, minced garlic, parsley, green onions and cappers  . Meats:  o Herbed greek chicken salad with kalamata olives, cucumber, feta  o Red bell peppers stuffed with spinach, bulgur, lean ground beef (or lentils) & topped with feta   o Kebabs: skewers of chicken, tomatoes, onions, zucchini, squash  o [place] burgers: made with red onions, mint, dill, lemon juice, feta cheese topped with roasted red peppers . Vegetarian o Cucumber  salad: cucumbers, artichoke hearts, celery, red onion, feta cheese, tossed in olive oil & lemon juice  o Hummus and whole grain pita points with a greek salad (lettuce, tomato, feta, olives, cucumbers, red onion) o Lentil soup with celery, carrots made with vegetable broth, garlic, salt and pepper  o Tabouli salad: parsley, bulgur, mint, scallions, cucumbers, tomato, radishes, lemon juice, olive oil, salt and pepper.

## 2014-06-04 NOTE — Progress Notes (Signed)
Subjective:    Patient ID: Alexandra Bishop, female    DOB: 09-21-1945, 69 y.o.   MRN: [id number]  HPI  68YO female presents for follow up. She has numerous concerns today.  Last seen 03/2014 for chronic low back pain.  Obesity - Met with nutritionist. Trying to increase fiber. Planning to limit calories to 1200 per day. A member of [employer], but not following their diet plan. Currently drinking at least 3 glasses of 1% milk per day. Notes this is effort to get in calcium.  Wt Readings from Last 3 Encounters:  06/04/14 209 lb 4 oz (94.915 kg)  03/29/14 208 lb 8 oz (94.575 kg)  12/03/13 196 lb 4 oz (89.018 kg)   Hemorrhoid - Went to see Dr. Burt Knack. No surgical intervention recommended. Starting on topical hemorrhoidal cream.  Spinal Stenosis - Had a fall with right leg weakness, landed on knees. Occurred before foot surgery.  No residual knee pain. Continues to have intermittent severe low back pain. Continues with PT. Would like to get back into swimming. Has not yet started.  Also concerned about her compression stockings. Current stockings do not stay up on legs.  She is concerned that her hypothyroidism is contributing to her inability to lose weight. She would like to consult with endocrinology.  Past medical, surgical, family and social history per today's encounter.  Review of Systems  Constitutional: Positive for fatigue. Negative for fever, chills, appetite change and unexpected weight change.  Eyes: Negative for visual disturbance.  Respiratory: Negative for shortness of breath.   Cardiovascular: Negative for chest pain and leg swelling.  Gastrointestinal: Negative for nausea, vomiting, abdominal pain, diarrhea and constipation.  Musculoskeletal: Positive for myalgias, back pain and arthralgias.  Skin: Negative for color change and rash.  Neurological: Negative for weakness, light-headedness, numbness and headaches.  Hematological: Negative for adenopathy. Does not  bruise/bleed easily.  Psychiatric/Behavioral: Negative for sleep disturbance and dysphoric mood. The patient is not nervous/anxious.        Objective:    BP 132/67 mmHg  Pulse 67  Temp(Src) 97.9 F (36.6 C) (Oral)  Ht 5' 6.5" (1.689 m)  Wt 209 lb 4 oz (94.915 kg)  BMI 33.27 kg/m2  SpO2 98% Physical Exam  Constitutional: She is oriented to person, place, and time. She appears well-developed and well-nourished. No distress.  HENT:  Head: Normocephalic and atraumatic.  Right Ear: External ear normal.  Left Ear: External ear normal.  Nose: Nose normal.  Mouth/Throat: Oropharynx is clear and moist. No oropharyngeal exudate.  Eyes: Conjunctivae are normal. Pupils are equal, round, and reactive to light. Right eye exhibits no discharge. Left eye exhibits no discharge. No scleral icterus.  Neck: Normal range of motion. Neck supple. No tracheal deviation present. No thyromegaly present.  Cardiovascular: Normal rate, regular rhythm, normal heart sounds and intact distal pulses.  Exam reveals no gallop and no friction rub.   No murmur heard. Pulmonary/Chest: Effort normal and breath sounds normal. No respiratory distress. She has no wheezes. She has no rales. She exhibits no tenderness.  Musculoskeletal: Normal range of motion. She exhibits no edema or tenderness.       Right knee: She exhibits normal range of motion and no swelling.       Left knee: She exhibits normal range of motion and no swelling.       Lumbar back: She exhibits normal range of motion, no tenderness and no pain.  Lymphadenopathy:    She has no cervical adenopathy.  Neurological: She is alert and oriented to person, place, and time. No cranial nerve deficit. She exhibits normal muscle tone. Coordination normal.  Skin: Skin is warm and dry. No rash noted. She is not diaphoretic. No erythema. No pallor.  Psychiatric: She has a normal mood and affect. Her behavior is normal. Judgment and thought content normal.           Assessment & Plan:  Over 40min of which >50% spent in face-to-face contact with patient discussing plan of care  Problem List Items Addressed This Visit      Unprioritized   Chronic low back pain    Reviewed previous lumbar spine MRI. Encouraged her to continue PT. Encouraged effort at weight loss. Will set up evaluation with Dr. Chasnis to see if she is a candidate for ESI.      Relevant Orders   Ambulatory referral to Orthopedic Surgery   Hypothyroidism    Reviewed previous TSH which was normal. She is concerned that her inability to lose weight is secondary to hypothyroidism and requests consultation with endocrine. We discussed that this is more likely related to sedentary lifestyle and diet. Referral placed.      Relevant Orders   Ambulatory referral to Endocrinology   Obesity - Primary    Wt Readings from Last 3 Encounters:  06/04/14 209 lb 4 oz (94.915 kg)  03/29/14 208 lb 8 oz (94.575 kg)  12/03/13 196 lb 4 oz (89.018 kg)   Continues to gain weight. Long discussion today about limiting caloric intake and increasing activity. Encouraged her to limit milk intake and consider getting calcium from other sources such as broccoli, spinach, and almonds. Encouraged her to limit carbohydrate intake with meals to 35gm. She will continue to follow up with nutrition and we will set up evaluation with endocrinology.      Venous insufficiency of leg    Encouraged her to consider Mediven compression hose and to follow up with her vascular surgeon.          Return in about 3 months (around 09/04/2014) for Recheck.   

## 2014-06-04 NOTE — Assessment & Plan Note (Signed)
Encouraged her to consider Mediven compression hose and to follow up with her vascular surgeon.

## 2014-06-04 NOTE — Progress Notes (Signed)
Pre visit review using our clinic review tool, if applicable. No additional management support is needed unless otherwise documented below in the visit note. 

## 2014-06-04 NOTE — Assessment & Plan Note (Signed)
Reviewed previous lumbar spine MRI. Encouraged her to continue PT. Encouraged effort at weight loss. Will set up evaluation with Dr. Sharlet Salina to see if she is a candidate for ESI.

## 2014-06-04 NOTE — Assessment & Plan Note (Signed)
Wt Readings from Last 3 Encounters:  06/04/14 209 lb 4 oz (94.915 kg)  03/29/14 208 lb 8 oz (94.575 kg)  12/03/13 196 lb 4 oz (89.018 kg)   Continues to gain weight. Long discussion today about limiting caloric intake and increasing activity. Encouraged her to limit milk intake and consider getting calcium from other sources such as broccoli, spinach, and almonds. Encouraged her to limit carbohydrate intake with meals to 35gm. She will continue to follow up with nutrition and we will set up evaluation with endocrinology.

## 2014-06-04 NOTE — Assessment & Plan Note (Signed)
Reviewed previous TSH which was normal. She is concerned that her inability to lose weight is secondary to hypothyroidism and requests consultation with endocrine. We discussed that this is more likely related to sedentary lifestyle and diet. Referral placed.

## 2014-06-06 DIAGNOSIS — M545 Low back pain: Secondary | ICD-10-CM | POA: Diagnosis not present

## 2014-06-17 ENCOUNTER — Ambulatory Visit: Payer: Medicare Other | Admitting: Podiatry

## 2014-06-18 DIAGNOSIS — M545 Low back pain: Secondary | ICD-10-CM | POA: Diagnosis not present

## 2014-06-20 ENCOUNTER — Encounter: Payer: Self-pay | Admitting: Endocrinology

## 2014-06-20 ENCOUNTER — Ambulatory Visit (INDEPENDENT_AMBULATORY_CARE_PROVIDER_SITE_OTHER): Payer: Medicare Other | Admitting: Endocrinology

## 2014-06-20 VITALS — BP 126/66 | HR 70 | Resp 14 | Ht 66.5 in | Wt 208.5 lb

## 2014-06-20 DIAGNOSIS — E039 Hypothyroidism, unspecified: Secondary | ICD-10-CM | POA: Diagnosis not present

## 2014-06-20 DIAGNOSIS — E669 Obesity, unspecified: Secondary | ICD-10-CM | POA: Diagnosis not present

## 2014-06-20 DIAGNOSIS — M545 Low back pain: Secondary | ICD-10-CM | POA: Diagnosis not present

## 2014-06-20 DIAGNOSIS — E559 Vitamin D deficiency, unspecified: Secondary | ICD-10-CM

## 2014-06-20 LAB — T4, FREE: Free T4: 1.22 ng/dL (ref 0.60–1.60)

## 2014-06-20 LAB — TSH: TSH: 1.63 u[IU]/mL (ref 0.35–4.50)

## 2014-06-20 NOTE — Assessment & Plan Note (Signed)
Agree with Dr Gilford Rile. Feel that it is related to lifestyle - encouraged her to increase exercise component and work on limiting calories with help of nutrition. Reviewed calcium requirements for her age given her osteopenia are 1200 mg /day. Encouraged green leafy vegetable intake to meet requirements.  She asked about fish oil and I think its okay for her to take 1 supplement a day, but encouraged her to increase fish intake ( preferable) as she is considering following the mediterranean diet. If her thyroid levels are normal, then explained that the weight is less likely related to the thyroid as her levels are controlled.

## 2014-06-20 NOTE — Progress Notes (Signed)
Pre visit review using our clinic review tool, if applicable. No additional management support is needed unless otherwise documented below in the visit note. 

## 2014-06-20 NOTE — Assessment & Plan Note (Signed)
-  Discussed with the patient regarding thyroid hormone physiology, symptoms and signs of hypothyroidism.  -We discussed about correct intake of levothyroxine, fasting, with water, separated by at least 30 minutes from breakfast, and separated by more than 4 hours from calcium, iron, multivitamins, acid reflux medications (PPIs). We discussed about medication compliance.  -she does not appear to have a goiter, thyroid nodules, or neck compression symptoms - Check thyroid levels today. Discussed goal TSH given her age, osteopenia and SVT would be in the mid normal range ( 1-2). If TSH levels are still in the low normal range, then would probably decrease her dose of levothyroxine.  -If these levels are normal, then RTC 6 months. -If dose adjustment made to LT4, then she would need follow up labs in 6 weeks.

## 2014-06-20 NOTE — Patient Instructions (Signed)
Labs today.  Continue current levothyroxine for now.  If TSH is in lower range, then expect a dose decrease.  Increase exercise component and work on diet.  Continue to follow up with nutrition.   Please come back for a follow-up appointment in 6 months

## 2014-06-20 NOTE — Assessment & Plan Note (Signed)
Recent levels at goal. Continue current MVI intake. Since levels are at goal, this doesn't explain her fatigue.

## 2014-06-20 NOTE — Progress Notes (Signed)
Reason for visit: Hypothyroidism  HPI  Alexandra Bishop is a 69 y.o.-year-old female, referred by her PCP, Ronette Deter, MD,  for evaluation for hypothyroidism and recent inability to lose weight.  Weight gone up gradually after second child, doing weight watchers for a while, but too technical and now just restricting calories.  Watching calories some>>nutritionist asked her to restrict to 1200 Kcal/day, but thinking of going down to 1000 kcal/day. Going to meet with nutritionist tomorrow. Has decreased milk intake to 2 glasses daily from 3, after Dr Gilford Rile suggested that. Not skipping meals. Recently, weight fluctuates up and down , but generally stable. Had recent surgery for bunion, also has lumbar spinal stenosis which limits activity. Doing little exercise now, but is a member of the gym. Planning to start water aerobics and cycling at the gym.  Denies any steroid injections in joints. Arthritis bothering her and plans to see specialist for that as well.   Patient recalls being diagnosed with hypothyroidism in >20 yeras following blood work done by her GYN. Most recently, she has been on Levothyroxine 100 mcg. Patient has been taking this medication fasting, with water, separate from breakfast > 30 minutes and from pills ( calcium, iron, PPIs, multivitamins)> 4 hours. Denies missing any doses recently. She takes this medication early morning, when she gets up to go to the BR.   Dose of levothyroxine hasn't changed recently.  Denies any recent use of iodine supplements or herbal medications. No prior history of thyroid nodules.   I reviewed pt's thyroid tests: Lab Results  Component Value Date   TSH 0.63 12/03/2013   TSH 0.35 06/27/2013   TSH 1.29 05/10/2013   TSH 1.05 01/09/2013   TSH 1.01 11/17/2012   TSH 1.78 01/18/2012   TSH 0.71 01/05/2011   FREET4 1.26 06/27/2013   T3FREE 3.0 06/27/2013    06/27/13: TPO <10, TgAB <20  Lab Results  Component Value Date   VD25OH 34.86  12/03/2013   Taking MVI one every other day. Has osteopenia.    Review of systems: [ x  ] complains of    [  ] denies [ x ] weight gain  [  ] constipation  [ x ] fatigue  >>worse this year [  ] dry skin  [  ] cold intolerance [  ] hair loss [  ] noticing any enlargement in size of thyroid [  ] lumps in neck [  ] dysphagia [  ] change in voice [  ]  SOB with lying down.  she has family history of  thyroid disorders: relation not known. No Family history of thyroid cancer.  No history of XRT to head or neck.   I reviewed her chart and she also has a history of Obesity, SVT, osteopenia. I have reviewed the patient's past medical history, family and social history, surgical history, medications and allergies.  Past Medical History  Diagnosis Date  . Hypothyroidism    Past Surgical History  Procedure Laterality Date  . Broken ankle - rt    . Bunionectomy  2015   Family History  Problem Relation Age of Onset  . Cancer      family hx  . Thyroid disease      family hx  . Cancer Mother     sarcoma  . Heart disease Brother   . Cancer Maternal Grandmother     breast  . Cancer Paternal Grandmother     breast  . Thyroid cancer Neg Hx  History   Social History  . Marital Status: Married    Spouse Name: N/A  . Number of Children: N/A  . Years of Education: N/A   Occupational History  . Not on file.   Social History Main Topics  . Smoking status: Never Smoker   . Smokeless tobacco: Never Used     Comment: tobacco use - no   . Alcohol Use: No  . Drug Use: No  . Sexual Activity: Not on file   Other Topics Concern  . Not on file   Social History Narrative   Married; retired - office work; gets regular exercise - walks a mile most days.    Current Outpatient Prescriptions on File Prior to Visit  Medication Sig Dispense Refill  . amLODipine (NORVASC) 10 MG tablet TAKE 1 TABLET BY MOUTH EVERY DAY 90 tablet 3  . doxycycline (VIBRAMYCIN) 50 MG capsule TAKE ONE CAPSULE BY  MOUTH ONCE A DAY 90 capsule 1  . gabapentin (NEURONTIN) 100 MG capsule TAKE 1 CAPSULE (100 MG TOTAL) BY MOUTH 4 (FOUR) TIMES DAILY. 360 capsule 1  . levothyroxine (SYNTHROID, LEVOTHROID) 100 MCG tablet TAKE 1 TABLET BY MOUTH EVERY DAY 90 tablet 1   No current facility-administered medications on file prior to visit.   Allergies  Allergen Reactions  . Hydrocodone     Feels Jittery  . Tramadol     Feels jittery    Review of Systems: [x]  complains of  [  ] denies General:   [  ] Recent weight change [ x ] Fatigue  [  ] Loss of appetite Eyes: [  ]  Vision Difficulty [  ]  Eye pain ENT: [  ]  Hearing difficulty [  ]  Difficulty Swallowing CVS: [  ] Chest pain [  ]  Palpitations/Irregular Heart beat [  ]  Shortness of breath lying flat [x  ] Swelling of legs Resp: [  ] Frequent Cough [  ] Shortness of Breath  [  ]  Wheezing GI: [  ] Heartburn  [  ] Nausea or Vomiting  [  ] Diarrhea [  ] Constipation  [  ] Abdominal Pain GU: [  ]  Polyuria  [  ]  nocturia Bones/joints:  [  ]  Muscle aches  [ x ] Joint Pain  [  ] Bone pain Skin/Hair/Nails: [  ]  Rash  [  ] New stretch marks [  ]  Itching [  ] Hair loss [  ]  Excessive hair growth Reproduction: [  ] Low sexual desire , [  ]  Women: Menstrual cycle problems [  ]  Women: Breast Discharge [  ] Men: Difficulty with erections [  ]  Men: Enlarged Breasts CNS: [  ] Frequent Headaches [  ] Blurry vision [  ] Tremors [  ] Seizures [  ] Loss of consciousness [  ] Localized weakness Endocrine: [  ]  Excess thirst [  ]  Feeling excessively hot [  ]  Feeling excessively cold Heme: [  ]  Easy bruising [  ]  Enlarged glands or lumps in neck Allergy: [  ]  Food allergies [  ] Environmental allergies  PE: BP 126/66 mmHg  Pulse 70  Resp 14  Ht 5' 6.5" (1.689 m)  Wt 208 lb 8 oz (94.575 kg)  BMI 33.15 kg/m2  SpO2 95% Wt Readings from Last 3 Encounters:  06/20/14 208 lb 8 oz (94.575 kg)  06/04/14 209 lb 4 oz (94.915 kg)  03/29/14 208 lb 8 oz (94.575 kg)     HEENT: [place]/AT, EOMI, no icterus, no proptosis, no chemosis, no mild lid lag, no retraction, eyes close completely Neck: thyroid gland - smooth, non-tender, no erythema, no tracheal deviation; negative Pemberton's sign; no lymphadenopathy; no bruits Lungs: good air entry, clear bilaterally Heart: S1&S2 normal, regular rate & rhythm; no murmurs, rubs or gallops Abd: soft, NT, ND, no HSM, +BS Ext: no tremor in hands bilaterally, no edema, 2+ DP/PT pulses, good muscle mass Neuro: normal gait, 2+ reflexes bilaterally, normal 5/5 strength, no proximal myopathy  Derm: no pretibial myxoedema/skin dryness   ASSESSMENT:    Problem List Items Addressed This Visit      Endocrine   Hypothyroidism - Primary    -Discussed with the patient regarding thyroid hormone physiology, symptoms and signs of hypothyroidism.  -We discussed about correct intake of levothyroxine, fasting, with water, separated by at least 30 minutes from breakfast, and separated by more than 4 hours from calcium, iron, multivitamins, acid reflux medications (PPIs). We discussed about medication compliance.  -she does not appear to have a goiter, thyroid nodules, or neck compression symptoms - Check thyroid levels today. Discussed goal TSH given her age, osteopenia and SVT would be in the mid normal range ( 1-2). If TSH levels are still in the low normal range, then would probably decrease her dose of levothyroxine.  -If these levels are normal, then RTC 6 months. -If dose adjustment made to LT4, then she would need follow up labs in 6 weeks.         Relevant Orders   TSH   T4, free     Other   Vitamin D deficiency    Recent levels at goal. Continue current MVI intake. Since levels are at goal, this doesn't explain her fatigue.       Relevant Orders   TSH   T4, free   Obesity    Agree with Dr Gilford Rile. Feel that it is related to lifestyle - encouraged her to increase exercise component and work on limiting calories with  help of nutrition. Reviewed calcium requirements for her age given her osteopenia are 1200 mg /day. Encouraged green leafy vegetable intake to meet requirements.  She asked about fish oil and I think its okay for her to take 1 supplement a day, but encouraged her to increase fish intake ( preferable) as she is considering following the mediterranean diet. If her thyroid levels are normal, then explained that the weight is less likely related to the thyroid as her levels are controlled.        Relevant Orders   TSH   T4, free       -RTC in 6 months  Jeannia Tatro [employer]  06/20/2014   9:54 AM

## 2014-06-21 ENCOUNTER — Ambulatory Visit
Admit: 2014-06-21 | Disposition: A | Discharge status: Home / Self Care | Payer: Self-pay | Attending: Internal Medicine | Admitting: Internal Medicine

## 2014-06-21 DIAGNOSIS — E669 Obesity, unspecified: Secondary | ICD-10-CM | POA: Diagnosis not present

## 2014-06-21 DIAGNOSIS — M545 Low back pain: Secondary | ICD-10-CM | POA: Diagnosis not present

## 2014-06-21 DIAGNOSIS — Z713 Dietary counseling and surveillance: Secondary | ICD-10-CM | POA: Diagnosis not present

## 2014-06-25 DIAGNOSIS — M545 Low back pain: Secondary | ICD-10-CM | POA: Diagnosis not present

## 2014-07-08 ENCOUNTER — Ambulatory Visit (INDEPENDENT_AMBULATORY_CARE_PROVIDER_SITE_OTHER): Payer: Medicare Other

## 2014-07-08 ENCOUNTER — Ambulatory Visit (INDEPENDENT_AMBULATORY_CARE_PROVIDER_SITE_OTHER): Payer: Medicare Other | Admitting: Podiatry

## 2014-07-08 ENCOUNTER — Encounter: Payer: Self-pay | Admitting: Podiatry

## 2014-07-08 VITALS — BP 140/84 | HR 70 | Resp 16 | Ht 67.0 in | Wt 205.0 lb

## 2014-07-08 DIAGNOSIS — M21621 Bunionette of right foot: Secondary | ICD-10-CM

## 2014-07-08 DIAGNOSIS — M205X2 Other deformities of toe(s) (acquired), left foot: Secondary | ICD-10-CM | POA: Diagnosis not present

## 2014-07-08 DIAGNOSIS — M205X1 Other deformities of toe(s) (acquired), right foot: Secondary | ICD-10-CM

## 2014-07-08 DIAGNOSIS — M21622 Bunionette of left foot: Secondary | ICD-10-CM

## 2014-07-08 NOTE — Progress Notes (Signed)
She presents today for follow-up of her fifth metatarsal osteotomy right foot. She states this seems to be doing reasonably well. She would like for me to take a look at her left fifth metatarsal. She denies any changes in her past medical history medications allergies surgeries and social history.  Objective: Vital signs are stable she is alert and oriented 3. Pulses are palpable bilateral. Surgical correction to the right foot appears to be completely well healed radiographs demonstrate well-healed osteotomy with internal fixation right foot. Evaluation of the left foot does demonstrate mild-to-moderate tailor's bunion deformity left foot with overlying soft tissue excess.  Assessment tailor's bunion deformity well healed right fifth metatarsal osteotomy. Fifth metatarsal tailor's bunion deformity left.  Plan: Discussed the pros and cons of surgical intervention regarding her left foot she's not wanting to have surgery at this point in time I will follow-up with her on a regular schedule.

## 2014-08-05 ENCOUNTER — Other Ambulatory Visit: Payer: Self-pay | Admitting: Internal Medicine

## 2014-09-05 ENCOUNTER — Encounter: Payer: Self-pay | Admitting: Internal Medicine

## 2014-09-05 ENCOUNTER — Ambulatory Visit (INDEPENDENT_AMBULATORY_CARE_PROVIDER_SITE_OTHER): Payer: Medicare Other | Admitting: Internal Medicine

## 2014-09-05 VITALS — BP 136/74 | HR 70 | Temp 98.2°F | Resp 14 | Ht 67.0 in | Wt 207.2 lb

## 2014-09-05 DIAGNOSIS — M545 Low back pain, unspecified: Secondary | ICD-10-CM

## 2014-09-05 DIAGNOSIS — K649 Unspecified hemorrhoids: Secondary | ICD-10-CM | POA: Insufficient documentation

## 2014-09-05 DIAGNOSIS — G8929 Other chronic pain: Secondary | ICD-10-CM | POA: Diagnosis not present

## 2014-09-05 DIAGNOSIS — E669 Obesity, unspecified: Secondary | ICD-10-CM | POA: Diagnosis not present

## 2014-09-05 MED ORDER — DULOXETINE HCL 30 MG PO CPEP: 30.0000 mg | ORAL_CAPSULE | Freq: Every day | ORAL | Status: DC

## 2014-09-05 NOTE — Progress Notes (Signed)
Subjective:    Patient ID: Alexandra Bishop, female    DOB: 1945-09-13, 69 y.o.   MRN: [id number]  HPI  69YO female presents for follow up.  Notes some forgetfullness. Feeling "weary." No fever, chills. Planning to follow vegan diet.  Recently seen by Dr. Phillip Heal in dermatology. Had cryotherapy of several lesions on her back. Had some redness and irritation with this.  Also recently had hemorrhoidal pain. Seen at [facility]. Started on Anusol and had incision and drainage of lesions. Eating more fiber. Hemorrhoids have improved.  Continues to have chronic low back pain. Has not yet seen Dr. Sharlet Salina. Taking Neurontin with minimal improvement. Prefers not to have steroid injections.  Wt Readings from Last 3 Encounters:  09/05/14 207 lb 3.2 oz (93.985 kg)  07/08/14 205 lb (92.987 kg)  06/20/14 208 lb 8 oz (94.575 kg)    Past medical, surgical, family and social history per today's encounter.  Review of Systems  Constitutional: Negative for fever, chills, appetite change, fatigue and unexpected weight change.  Eyes: Negative for visual disturbance.  Respiratory: Negative for shortness of breath.   Cardiovascular: Negative for chest pain and leg swelling.  Gastrointestinal: Negative for nausea, vomiting, abdominal pain, diarrhea, constipation, blood in stool and rectal pain.  Musculoskeletal: Positive for myalgias, back pain and arthralgias.  Skin: Negative for color change and rash.  Hematological: Negative for adenopathy. Does not bruise/bleed easily.  Psychiatric/Behavioral: Negative for dysphoric mood. The patient is not nervous/anxious.        Objective:    BP 136/74 mmHg  Pulse 70  Temp(Src) 98.2 F (36.8 C) (Oral)  Resp 14  Ht 5\' 7"  (1.702 m)  Wt 207 lb 3.2 oz (93.985 kg)  BMI 32.44 kg/m2  SpO2 95% Physical Exam  Constitutional: She is oriented to person, place, and time. She appears well-developed and well-nourished. No distress.  HENT:  Head:  Normocephalic and atraumatic.  Right Ear: External ear normal.  Left Ear: External ear normal.  Nose: Nose normal.  Mouth/Throat: Oropharynx is clear and moist. No oropharyngeal exudate.  Eyes: Conjunctivae are normal. Pupils are equal, round, and reactive to light. Right eye exhibits no discharge. Left eye exhibits no discharge. No scleral icterus.  Neck: Normal range of motion. Neck supple. No tracheal deviation present. No thyromegaly present.  Cardiovascular: Normal rate, regular rhythm, normal heart sounds and intact distal pulses.  Exam reveals no gallop and no friction rub.   No murmur heard. Pulmonary/Chest: Effort normal and breath sounds normal. No respiratory distress. She has no wheezes. She has no rales. She exhibits no tenderness.  Musculoskeletal: Normal range of motion. She exhibits no edema or tenderness.       Lumbar back: She exhibits pain. She exhibits normal range of motion and no tenderness.  Lymphadenopathy:    She has no cervical adenopathy.  Neurological: She is alert and oriented to person, place, and time. No cranial nerve deficit. She exhibits normal muscle tone. Coordination normal.  Skin: Skin is warm and dry. No rash noted. She is not diaphoretic. No erythema. No pallor.  Psychiatric: She has a normal mood and affect. Her behavior is normal. Judgment and thought content normal.          Assessment & Plan:   Problem List Items Addressed This Visit      Unprioritized   Chronic low back pain - Primary    Chronic low back pain secondary to DJD. No improvement with Neurontin. Will try adding Cymbalta. Follow up  in 4 weeks and prn.      Relevant Medications   DULoxetine (CYMBALTA) 30 MG capsule   Hemorrhoid    Reviewed notes from [place]. Hemorrhoid improved after incision and drainage. Will follow.      Obesity    Wt Readings from Last 3 Encounters:  09/05/14 207 lb 3.2 oz (93.985 kg)  07/08/14 205 lb (92.987 kg)  06/20/14 208 lb 8 oz (94.575 kg)     Body mass index is 32.44 kg/(m^2). Encouraged healthy diet and exercise. Discussed benefits of vegan diet which she is planning to pursue          Return in about 4 weeks (around 10/03/2014) for Recheck.

## 2014-09-05 NOTE — Assessment & Plan Note (Signed)
Wt Readings from Last 3 Encounters:  09/05/14 207 lb 3.2 oz (93.985 kg)  07/08/14 205 lb (92.987 kg)  06/20/14 208 lb 8 oz (94.575 kg)   Body mass index is 32.44 kg/(m^2). Encouraged healthy diet and exercise. Discussed benefits of vegan diet which she is planning to pursue

## 2014-09-05 NOTE — Progress Notes (Signed)
Pre visit review using our clinic review tool, if applicable. No additional management support is needed unless otherwise documented below in the visit note. 

## 2014-09-05 NOTE — Assessment & Plan Note (Signed)
Reviewed notes from [place]. Hemorrhoid improved after incision and drainage. Will follow.

## 2014-09-05 NOTE — Patient Instructions (Signed)
Start Cymbalta 30mg  daily to help with pain.  Follow up in 4 weeks or sooner as needed.

## 2014-09-05 NOTE — Assessment & Plan Note (Signed)
Chronic low back pain secondary to DJD. No improvement with Neurontin. Will try adding Cymbalta. Follow up in 4 weeks and prn.

## 2014-09-12 DIAGNOSIS — E669 Obesity, unspecified: Secondary | ICD-10-CM | POA: Diagnosis not present

## 2014-09-12 DIAGNOSIS — I890 Lymphedema, not elsewhere classified: Secondary | ICD-10-CM | POA: Diagnosis not present

## 2014-09-12 DIAGNOSIS — M7989 Other specified soft tissue disorders: Secondary | ICD-10-CM | POA: Diagnosis not present

## 2014-09-12 DIAGNOSIS — I872 Venous insufficiency (chronic) (peripheral): Secondary | ICD-10-CM | POA: Diagnosis not present

## 2014-09-26 ENCOUNTER — Telehealth: Payer: Self-pay | Admitting: *Deleted

## 2014-09-26 NOTE — Telephone Encounter (Signed)
OK. Please ask her to stop the medication. Then we will need to make a follow up appointment to discuss alternative treatments. 30min

## 2014-09-26 NOTE — Telephone Encounter (Signed)
Pt called states since starting on Cymbalta, she is not feeling rested the next morning and she is experiencing some dizziness in the mornings.  Pt is aware Dr Gilford Rile will not return until Monday.  Pt is ok with message not being read by MD til then.  Please advise

## 2014-09-27 NOTE — Telephone Encounter (Signed)
Spoke with pt, pt scheduled 7.13.16 at 2:30pm

## 2014-10-02 ENCOUNTER — Encounter: Payer: Self-pay | Admitting: Internal Medicine

## 2014-10-02 ENCOUNTER — Ambulatory Visit (INDEPENDENT_AMBULATORY_CARE_PROVIDER_SITE_OTHER): Payer: Medicare Other | Admitting: Internal Medicine

## 2014-10-02 VITALS — BP 117/71 | HR 74 | Temp 98.5°F | Ht 67.0 in | Wt 204.1 lb

## 2014-10-02 DIAGNOSIS — M545 Low back pain, unspecified: Secondary | ICD-10-CM

## 2014-10-02 DIAGNOSIS — R5382 Chronic fatigue, unspecified: Secondary | ICD-10-CM | POA: Diagnosis not present

## 2014-10-02 DIAGNOSIS — G8929 Other chronic pain: Secondary | ICD-10-CM

## 2014-10-02 DIAGNOSIS — R5383 Other fatigue: Secondary | ICD-10-CM | POA: Insufficient documentation

## 2014-10-02 LAB — CBC WITH DIFFERENTIAL/PLATELET
Basophils Absolute: 0 10*3/uL (ref 0.0–0.1)
Basophils Relative: 0.5 % (ref 0.0–3.0)
Eosinophils Absolute: 0.2 10*3/uL (ref 0.0–0.7)
Eosinophils Relative: 2.3 % (ref 0.0–5.0)
HCT: 48.6 % — ABNORMAL HIGH (ref 36.0–46.0)
Hemoglobin: 15.9 g/dL — ABNORMAL HIGH (ref 12.0–15.0)
Lymphocytes Relative: 18.9 % (ref 12.0–46.0)
Lymphs Abs: 1.4 10*3/uL (ref 0.7–4.0)
MCHC: 32.7 g/dL (ref 30.0–36.0)
MCV: 82.8 fl (ref 78.0–100.0)
Monocytes Absolute: 0.5 10*3/uL (ref 0.1–1.0)
Monocytes Relative: 6.2 % (ref 3.0–12.0)
Neutro Abs: 5.4 10*3/uL (ref 1.4–7.7)
Neutrophils Relative %: 72.1 % (ref 43.0–77.0)
Platelets: 271 10*3/uL (ref 150.0–400.0)
RBC: 5.87 Mil/uL — ABNORMAL HIGH (ref 3.87–5.11)
RDW: 13.6 % (ref 11.5–15.5)
WBC: 7.5 10*3/uL (ref 4.0–10.5)

## 2014-10-02 LAB — COMPREHENSIVE METABOLIC PANEL
ALT: 26 U/L (ref 0–35)
AST: 27 U/L (ref 0–37)
Albumin: 4.4 g/dL (ref 3.5–5.2)
Alkaline Phosphatase: 87 U/L (ref 39–117)
BUN: 20 mg/dL (ref 6–23)
CO2: 26 mEq/L (ref 19–32)
Calcium: 10 mg/dL (ref 8.4–10.5)
Chloride: 107 mEq/L (ref 96–112)
Creatinine, Ser: 0.95 mg/dL (ref 0.40–1.20)
GFR: 61.61 mL/min (ref >60.00)
Glucose, Bld: 106 mg/dL — ABNORMAL HIGH (ref 70–99)
Potassium: 4 mEq/L (ref 3.5–5.1)
Sodium: 141 mEq/L (ref 135–145)
Total Bilirubin: 0.6 mg/dL (ref 0.2–1.2)
Total Protein: 6.9 g/dL (ref 6.0–8.3)

## 2014-10-02 NOTE — Assessment & Plan Note (Signed)
Chronic mild fatigue. Exam is normal. Will request report on previous sleep study. Recent thyroid function was normal. Will check CBC, B12, and CMP with labs today.

## 2014-10-02 NOTE — Progress Notes (Signed)
Pre visit review using our clinic review tool, if applicable. No additional management support is needed unless otherwise documented below in the visit note. 

## 2014-10-02 NOTE — Assessment & Plan Note (Signed)
Unable to tolerate Cymbalta. Started back on Neurontin. Will continue. Follow up in 3 months and prn.

## 2014-10-02 NOTE — Progress Notes (Signed)
Subjective:    Patient ID: Alexandra Bishop, female    DOB: 1946-03-13, 69 y.o.   MRN: [id number]  HPI  69YO female presents for follow up.  Last seen 4 weeks ago. Started on Cymbalta to help with chronic pain. Did not feel well on this medication, so stopped it and started back on Neurontin. Back pain is reasonably well controlled with Neurontin.  Trying to follow a healthier diet. Trying to increase physical activity.  Continues to feel some chronic fatigue. No focal symptoms.  Had a sleep study in the distant past, but does not recall results.  Wt Readings from Last 3 Encounters:  10/02/14 204 lb 2 oz (92.59 kg)  09/05/14 207 lb 3.2 oz (93.985 kg)  07/08/14 205 lb (92.987 kg)    Past medical, surgical, family and social history per today's encounter.  Review of Systems  Constitutional: Positive for fatigue. Negative for fever, chills, appetite change and unexpected weight change.  Eyes: Negative for visual disturbance.  Respiratory: Negative for shortness of breath.   Cardiovascular: Negative for chest pain and leg swelling.  Gastrointestinal: Negative for nausea, vomiting, abdominal pain, diarrhea and constipation.  Musculoskeletal: Positive for myalgias, back pain and arthralgias.  Skin: Negative for color change and rash.  Hematological: Negative for adenopathy. Does not bruise/bleed easily.  Psychiatric/Behavioral: Negative for sleep disturbance and dysphoric mood. The patient is not nervous/anxious.        Objective:    BP 117/71 mmHg  Pulse 74  Temp(Src) 98.5 F (36.9 C) (Oral)  Ht 5' 7"  (1.702 m)  Wt 204 lb 2 oz (92.59 kg)  BMI 31.96 kg/m2  SpO2 95% Physical Exam  Constitutional: She is oriented to person, place, and time. She appears well-developed and well-nourished. No distress.  HENT:  Head: Normocephalic and atraumatic.  Right Ear: External ear normal.  Left Ear: External ear normal.  Nose: Nose normal.  Mouth/Throat: Oropharynx is clear and moist.  No oropharyngeal exudate.  Eyes: Conjunctivae are normal. Pupils are equal, round, and reactive to light. Right eye exhibits no discharge. Left eye exhibits no discharge. No scleral icterus.  Neck: Normal range of motion. Neck supple. No tracheal deviation present. No thyromegaly present.  Cardiovascular: Normal rate, regular rhythm, normal heart sounds and intact distal pulses.  Exam reveals no gallop and no friction rub.   No murmur heard. Pulmonary/Chest: Effort normal and breath sounds normal. No respiratory distress. She has no wheezes. She has no rales. She exhibits no tenderness.  Musculoskeletal: She exhibits no edema or tenderness.       Lumbar back: She exhibits decreased range of motion and pain.  Lymphadenopathy:    She has no cervical adenopathy.  Neurological: She is alert and oriented to person, place, and time. No cranial nerve deficit. She exhibits normal muscle tone. Coordination normal.  Skin: Skin is warm and dry. No rash noted. She is not diaphoretic. No erythema. No pallor.  Psychiatric: She has a normal mood and affect. Her behavior is normal. Judgment and thought content normal.          Assessment & Plan:   Problem List Items Addressed This Visit      Unprioritized   Chronic low back pain - Primary    Unable to tolerate Cymbalta. Started back on Neurontin. Will continue. Follow up in 3 months and prn.      Fatigue    Chronic mild fatigue. Exam is normal. Will request report on previous sleep study. Recent thyroid function was normal.  Will check CBC, B12, and CMP with labs today.      Relevant Orders   CBC with Differential/Platelet   B12   Comp Met (CMET)       Return in about 3 months (around 01/02/2015).

## 2014-10-02 NOTE — Patient Instructions (Signed)
Continue Neurontin.  Continue efforts at healthy diet and exercise.

## 2014-10-03 LAB — VITAMIN B12: Vitamin B-12: 458 pg/mL (ref 211–911)

## 2014-10-14 ENCOUNTER — Ambulatory Visit: Payer: Medicare Other | Admitting: Internal Medicine

## 2014-11-01 ENCOUNTER — Other Ambulatory Visit: Payer: Self-pay | Admitting: Internal Medicine

## 2014-11-01 NOTE — Telephone Encounter (Signed)
Last OV 10/02/14 ok to fill?

## 2014-11-02 ENCOUNTER — Other Ambulatory Visit: Payer: Self-pay | Admitting: Internal Medicine

## 2014-11-26 DIAGNOSIS — L821 Other seborrheic keratosis: Secondary | ICD-10-CM | POA: Diagnosis not present

## 2014-11-26 DIAGNOSIS — B372 Candidiasis of skin and nail: Secondary | ICD-10-CM | POA: Diagnosis not present

## 2014-11-26 DIAGNOSIS — S60311A Abrasion of right thumb, initial encounter: Secondary | ICD-10-CM | POA: Diagnosis not present

## 2014-11-26 DIAGNOSIS — L400 Psoriasis vulgaris: Secondary | ICD-10-CM | POA: Diagnosis not present

## 2014-11-26 DIAGNOSIS — Z1283 Encounter for screening for malignant neoplasm of skin: Secondary | ICD-10-CM | POA: Diagnosis not present

## 2014-12-05 ENCOUNTER — Ambulatory Visit: Payer: Medicare Other | Admitting: Internal Medicine

## 2014-12-06 DIAGNOSIS — Z23 Encounter for immunization: Secondary | ICD-10-CM | POA: Diagnosis not present

## 2014-12-19 ENCOUNTER — Ambulatory Visit: Payer: Medicare Other | Admitting: Endocrinology

## 2014-12-23 ENCOUNTER — Ambulatory Visit
Admission: RE | Admit: 2014-12-23 | Discharge: 2014-12-23 | Disposition: A | Discharge status: Home / Self Care | Payer: Medicare Other | Source: Ambulatory Visit | Attending: Internal Medicine | Admitting: Internal Medicine

## 2014-12-23 ENCOUNTER — Encounter: Payer: Self-pay | Admitting: Internal Medicine

## 2014-12-23 ENCOUNTER — Ambulatory Visit (INDEPENDENT_AMBULATORY_CARE_PROVIDER_SITE_OTHER): Payer: Medicare Other | Admitting: Internal Medicine

## 2014-12-23 VITALS — BP 125/75 | HR 67 | Temp 98.0°F | Ht 67.0 in | Wt 204.4 lb

## 2014-12-23 DIAGNOSIS — M25562 Pain in left knee: Secondary | ICD-10-CM | POA: Diagnosis not present

## 2014-12-23 DIAGNOSIS — M1712 Unilateral primary osteoarthritis, left knee: Secondary | ICD-10-CM | POA: Diagnosis not present

## 2014-12-23 DIAGNOSIS — E669 Obesity, unspecified: Secondary | ICD-10-CM

## 2014-12-23 DIAGNOSIS — M2550 Pain in unspecified joint: Secondary | ICD-10-CM

## 2014-12-23 DIAGNOSIS — I10 Essential (primary) hypertension: Secondary | ICD-10-CM

## 2014-12-23 LAB — CBC
HCT: 48.4 % — ABNORMAL HIGH (ref 36.0–46.0)
Hemoglobin: 15.7 g/dL — ABNORMAL HIGH (ref 12.0–15.0)
MCHC: 32.5 g/dL (ref 30.0–36.0)
MCV: 83.8 fl (ref 78.0–100.0)
Platelets: 284 10*3/uL (ref 150.0–400.0)
RBC: 5.77 Mil/uL — ABNORMAL HIGH (ref 3.87–5.11)
RDW: 13.1 % (ref 11.5–15.5)
WBC: 8.2 10*3/uL (ref 4.0–10.5)

## 2014-12-23 NOTE — Progress Notes (Signed)
Pre visit review using our clinic review tool, if applicable. No additional management support is needed unless otherwise documented below in the visit note. 

## 2014-12-23 NOTE — Patient Instructions (Addendum)
Look into the book "Whole 30" or "Always Hungry"  Increase exercise to goal of 39min 5 days per week.  Please go to [facility] for xray of left knee.    Labs today.  Follow up in 4 weeks.

## 2014-12-23 NOTE — Progress Notes (Signed)
Subjective:    Patient ID: Alexandra Bishop, female    DOB: October 14, 1945, 69 y.o.   MRN: [id number]  HPI  69YO female presents for follow up. She brings a handwritten list of concerns.  Last seen in 09/2014 for chronic low back pain.  Obesity - Frustrated by weight gain, and inability to lose weight. Aiming for 1000 calories. Exercising some, maybe 2x per week. States "we need to do something about this" right away.  Right ankle pain - Chronic aching pain. Seen by podiatry in past. Told she is a candidate for surgery. Does not want to consider surgical intervention. Plans to follow up about possible insoles with podiatry  Seen by vascular - Has follow up in 2 years. No current swelling or pain.  Hand arthralgia - Notes thickening of joints. No pain in hands. Some pain in elbow with waking. Not taking anything for this. Joints sometimes feel stiff. No family history of RA.  Left knee pain - Chronic. Recently worsening. Would like to get imaging. Does not give out on her. No swelling. No injuries. Not taking anything for this.  Wt Readings from Last 3 Encounters:  12/23/14 204 lb 6 oz (92.704 kg)  10/02/14 204 lb 2 oz (92.59 kg)  09/05/14 207 lb 3.2 oz (93.985 kg)   BP Readings from Last 3 Encounters:  12/23/14 125/75  10/02/14 117/71  09/05/14 136/74    Past Medical History  Diagnosis Date  . Hypothyroidism    Family History  Problem Relation Age of Onset  . Cancer      family hx  . Thyroid disease      family hx  . Cancer Mother     sarcoma  . Heart disease Brother   . Cancer Maternal Grandmother     breast  . Cancer Paternal Grandmother     breast  . Thyroid cancer Neg Hx    Past Surgical History  Procedure Laterality Date  . Broken ankle - rt    . Bunionectomy  2015   Social History   Social History  . Marital Status: Married    Spouse Name: N/A  . Number of Children: N/A  . Years of Education: N/A   Social History Main Topics  . Smoking status: Never  Smoker   . Smokeless tobacco: Never Used     Comment: tobacco use - no   . Alcohol Use: No  . Drug Use: No  . Sexual Activity: Not Asked   Other Topics Concern  . None   Social History Narrative   Married; retired - office work; gets regular exercise - walks a mile most days.     Review of Systems  Constitutional: Negative for fever, chills, diaphoresis, appetite change, fatigue and unexpected weight change.  Eyes: Negative for visual disturbance.  Respiratory: Negative for shortness of breath.   Cardiovascular: Negative for chest pain, palpitations and leg swelling.  Gastrointestinal: Negative for nausea, vomiting, abdominal pain, diarrhea, constipation, blood in stool and rectal pain.  Musculoskeletal: Positive for myalgias, back pain, joint swelling and arthralgias.  Skin: Negative for color change and rash.  Hematological: Negative for adenopathy. Does not bruise/bleed easily.  Psychiatric/Behavioral: Negative for sleep disturbance and dysphoric mood. The patient is not nervous/anxious.        Objective:    BP 125/75 mmHg  Pulse 67  Temp(Src) 98 F (36.7 C) (Oral)  Ht 5\' 7"  (1.702 m)  Wt 204 lb 6 oz (92.704 kg)  BMI 32.00 kg/m2  SpO2  96% Physical Exam  Constitutional: She is oriented to person, place, and time. She appears well-developed and well-nourished. No distress.  HENT:  Head: Normocephalic and atraumatic.  Right Ear: External ear normal.  Left Ear: External ear normal.  Nose: Nose normal.  Mouth/Throat: Oropharynx is clear and moist. No oropharyngeal exudate.  Eyes: Conjunctivae are normal. Pupils are equal, round, and reactive to light. Right eye exhibits no discharge. Left eye exhibits no discharge. No scleral icterus.  Neck: Normal range of motion. Neck supple. No tracheal deviation present. No thyromegaly present.  Cardiovascular: Normal rate, regular rhythm, normal heart sounds and intact distal pulses.  Exam reveals no gallop and no friction rub.   No  murmur heard. Pulmonary/Chest: Effort normal and breath sounds normal. No respiratory distress. She has no wheezes. She has no rales. She exhibits no tenderness.  Musculoskeletal: Normal range of motion. She exhibits no edema or tenderness.       Right knee: She exhibits normal range of motion and no swelling.       Left knee: She exhibits normal range of motion and no swelling. No tenderness found.       Right hand: She exhibits normal range of motion, no tenderness and no bony tenderness.       Left hand: She exhibits normal range of motion, no tenderness and no bony tenderness.  Lymphadenopathy:    She has no cervical adenopathy.  Neurological: She is alert and oriented to person, place, and time. No cranial nerve deficit. She exhibits normal muscle tone. Coordination normal.  Skin: Skin is warm and dry. No rash noted. She is not diaphoretic. No erythema. No pallor.  Psychiatric: She has a normal mood and affect. Her behavior is normal. Judgment and thought content normal.          Assessment & Plan:   Problem List Items Addressed This Visit      Unprioritized   Arthralgia    Diffuse arthralgia. Some joint thickening noted in PIP joints. No joint effusion appreciated. Will screen for RA with ANA, CBC, RF. She is unable to tolerate NSAIDS because of history of gastritis. Continue Neurontin.      Relevant Orders   CBC   ANA   Rheumatoid Factor   Hypertension    BP Readings from Last 3 Encounters:  12/23/14 125/75  10/02/14 117/71  09/05/14 136/74   BP well controlled. Continue Amlodipine.      Left knee pain    Recent left knee pain. Exam is normal. Suspect OA. Will get plain xray.      Relevant Orders   DG Knee Complete 4 Views Left   Obesity - Primary    Wt Readings from Last 3 Encounters:  12/23/14 204 lb 6 oz (92.704 kg)  10/02/14 204 lb 2 oz (92.59 kg)  09/05/14 207 lb 3.2 oz (93.985 kg)   Body mass index is 32 kg/(m^2). Encouraged her to consider following  diet lower in carbohydrates. Encouraged her to increase exercise to 5hr per week.          Return in about 4 weeks (around 01/20/2015) for Recheck.

## 2014-12-23 NOTE — Assessment & Plan Note (Signed)
Diffuse arthralgia. Some joint thickening noted in PIP joints. No joint effusion appreciated. Will screen for RA with ANA, CBC, RF. She is unable to tolerate NSAIDS because of history of gastritis. Continue Neurontin.

## 2014-12-23 NOTE — Assessment & Plan Note (Signed)
Recent left knee pain. Exam is normal. Suspect OA. Will get plain xray.

## 2014-12-23 NOTE — Assessment & Plan Note (Signed)
Wt Readings from Last 3 Encounters:  12/23/14 204 lb 6 oz (92.704 kg)  10/02/14 204 lb 2 oz (92.59 kg)  09/05/14 207 lb 3.2 oz (93.985 kg)   Body mass index is 32 kg/(m^2). Encouraged her to consider following diet lower in carbohydrates. Encouraged her to increase exercise to 5hr per week.

## 2014-12-23 NOTE — Assessment & Plan Note (Signed)
BP Readings from Last 3 Encounters:  12/23/14 125/75  10/02/14 117/71  09/05/14 136/74   BP well controlled. Continue Amlodipine.

## 2014-12-24 ENCOUNTER — Telehealth: Payer: Self-pay | Admitting: Internal Medicine

## 2014-12-24 LAB — ANA: Anti Nuclear Antibody(ANA): NEGATIVE

## 2014-12-24 LAB — RHEUMATOID FACTOR: Rhuematoid fact SerPl-aCnc: <10 IU/mL (ref <=14)

## 2014-12-24 NOTE — Telephone Encounter (Signed)
Pt called wanting to know the results of her labs and X-ray. Please advise pt.Marland Kitchen

## 2014-12-25 NOTE — Telephone Encounter (Signed)
I have sent her email messages through [facility] about all of her results

## 2014-12-25 NOTE — Telephone Encounter (Signed)
Please see below request  

## 2015-01-07 ENCOUNTER — Ambulatory Visit (INDEPENDENT_AMBULATORY_CARE_PROVIDER_SITE_OTHER): Payer: Medicare Other

## 2015-01-07 ENCOUNTER — Other Ambulatory Visit: Payer: Self-pay | Admitting: Internal Medicine

## 2015-01-07 VITALS — BP 138/72 | HR 62 | Temp 97.1°F | Resp 14 | Ht 65.5 in | Wt 203.8 lb

## 2015-01-07 DIAGNOSIS — Z0000 Encounter for general adult medical examination without abnormal findings: Secondary | ICD-10-CM | POA: Diagnosis not present

## 2015-01-07 DIAGNOSIS — Z1239 Encounter for other screening for malignant neoplasm of breast: Secondary | ICD-10-CM

## 2015-01-07 NOTE — Progress Notes (Signed)
Annual Wellness Visit as completed by Health Coach was reviewed in full.  

## 2015-01-07 NOTE — Progress Notes (Signed)
Subjective:   Alexandra Bishop is a 69 y.o. female who presents for Medicare Annual (Subsequent) preventive examination.  Review of Systems:  No ROS.  Medicare Wellness Visit. Cardiac Risk Factors include: advanced age (>40men, >21 women)     Objective:     Vitals: BP 138/72 mmHg  Pulse 62  Temp(Src) 97.1 F (36.2 C) (Oral)  Resp 14  Ht 5' 5.5" (1.664 m)  Wt 203 lb 12.8 oz (92.443 kg)  BMI 33.39 kg/m2  SpO2 96%  Tobacco History  Smoking status  . Never Smoker   Smokeless tobacco  . Never Used    Comment: tobacco use - no      Counseling given: Not Answered   Past Medical History  Diagnosis Date  . Hypothyroidism    Past Surgical History  Procedure Laterality Date  . Broken ankle - rt    . Bunionectomy  2015   Family History  Problem Relation Age of Onset  . Cancer      family hx  . Thyroid disease      family hx  . Cancer Mother     sarcoma  . Heart disease Brother   . Cancer Maternal Grandmother     breast  . Cancer Paternal Grandmother     breast  . Thyroid cancer Neg Hx    History  Sexual Activity  . Sexual Activity: Not Currently    Outpatient Encounter Prescriptions as of 01/07/2015  Medication Sig  . amLODipine (NORVASC) 10 MG tablet TAKE 1 TABLET BY MOUTH EVERY DAY  . clobetasol (TEMOVATE) 0.05 % external solution APPLY TO AFFECTED AREA ON SCALP AT BEDTIME  . doxycycline (VIBRAMYCIN) 50 MG capsule TAKE ONE CAPSULE BY MOUTH ONCE A DAY  . gabapentin (NEURONTIN) 100 MG capsule TAKE 1 CAPSULE (100 MG TOTAL) BY MOUTH 4 (FOUR) TIMES DAILY.  Marland Kitchen levothyroxine (SYNTHROID, LEVOTHROID) 100 MCG tablet TAKE 1 TABLET BY MOUTH EVERY DAY   No facility-administered encounter medications on file as of 01/07/2015.    Activities of Daily Living In your present state of health, do you have any difficulty performing the following activities: 01/07/2015  Hearing? N  Vision? N  Difficulty concentrating or making decisions? N  Walking or climbing stairs? Y    Dressing or bathing? N  Doing errands, shopping? N  Preparing Food and eating ? N  Using the Toilet? N  In the past six months, have you accidently leaked urine? N  Do you have problems with loss of bowel control? N  Managing your Medications? N  Managing your Finances? N  Housekeeping or managing your Housekeeping? N    Patient Care Team: Ronette Deter, MD as PCP - General (Internal Medicine)    Assessment:    This is a routine medicare annual wellness visit for [employer].  The goal of the wellness visit is to assist the patient how to close the gaps in care and create a preventative care plan for the patient.   Osteoporosis risk reviewed.  Taking meds without issues; no barriers identified.  Safety issues reviewed; smoke detectors in the home. Firearms locked in a secure area. Wears seatbelts when driving or riding with others. No violence in the home.  The patient was oriented x 3; appropriate in dress and manner and no objective failures at ADL's or IADL's.   Patient concerns:  Fatigued for sometime upon waking. Uncomfortable driving at times due to lack of energy.  L knee referral to ortho, if recommended after recent XRAY.  Deferred to PCP.   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  Bilateral PASS in all other frequencies.  Hearing loss education provided.  Exercise Activities and Dietary recommendations Current Exercise Habits:: Structured exercise class (Currently walks 3x times daily at home and exercises at the [facility]  2x weekly), Type of exercise: walking (Currently walks up to 3 miles, 3x weekly at home), Time (Minutes): 30, Frequency (Times/Week): 5, Weekly Exercise (Minutes/Week): 150, Intensity: Mild  Goals    . Increase physical activity     Add 1 mile to current walking regiment of 3 miles every other day.  Encouraged to exercise as tolerated.    . Increase water intake     Incorporate more water into diet.  Up to 4 glasses per day.       Fall Risk Fall Risk  01/07/2015 12/03/2013 12/08/2012  Falls in the past year? Yes Yes No  Number falls in past yr: 1 1 -  Injury with Fall? Yes No -  Risk for fall due to : - History of fall(s) -  Follow up Education provided;Falls prevention discussed - -   Depression Screen PHQ 2/9 Scores 01/07/2015 12/03/2013 12/08/2012 01/24/2012  PHQ - 2 Score 0 0 0 0     Cognitive Testing MMSE - Mini Mental State Exam 01/07/2015  Orientation to time 5  Orientation to Place 5  Registration 3  Attention/ Calculation 5  Recall 3  Language- name 2 objects 2  Language- repeat 1  Language- follow 3 step command 3  Language- read & follow direction 1  Write a sentence 1  Copy design 1  Total score 30    Immunization History  Administered Date(s) Administered  . Influenza Split 01/04/2011, 11/24/2011, 12/13/2013  . Influenza-Unspecified 01/03/2013, 12/23/2014  . Pneumococcal Conjugate-13 12/03/2013  . Pneumococcal Polysaccharide-23 07/29/2009  . Tdap 01/12/2008  . Zoster 11/24/2011   Screening Tests Health Maintenance  Topic Date Due  . Hepatitis C Screening  [birth year]  . MAMMOGRAM  08/30/2014  . INFLUENZA VACCINE  10/21/2015  . TETANUS/TDAP  01/11/2018  . COLONOSCOPY  05/10/2021  . DEXA SCAN  Completed  . ZOSTAVAX  Completed      Plan:    End of life/Advanced Care palnning was discussed. Return copy of completed Advanced Directives.  HEP C Screening due.  Postponed per patient request to follow up with PCP.  MAMMOGRAM referral to be placed.  During the course of the visit the patient was educated and counseled about the following appropriate screening and preventive services:   Vaccines to include Pneumoccal, Influenza, Hepatitis B, Td, Zostavax, HCV  Electrocardiogram  Cardiovascular Disease  Colorectal cancer screening  Bone density screening  Diabetes screening  Glaucoma screening  Mammography/PAP  Nutrition counseling   Patient Instructions (the  written plan) was given to the patient.   Varney Biles, LPN  03/[id number]

## 2015-01-07 NOTE — Patient Instructions (Addendum)
Alexandra Bishop,  Thank you for taking time to come for your Medicare Wellness Visit.  I appreciate your ongoing commitment to your health goals. Please review the following plan we discussed and let me know if I can assist you in the future.   Hep C screening due  Fat and Cholesterol Restricted Diet High levels of fat and cholesterol in your blood may lead to various health problems, such as diseases of the heart, blood vessels, gallbladder, liver, and pancreas. Fats are concentrated sources of energy that come in various forms. Certain types of fat, including saturated fat, may be harmful in excess. Cholesterol is a substance needed by your body in small amounts. Your body makes all the cholesterol it needs. Excess cholesterol comes from the food you eat. When you have high levels of cholesterol and saturated fat in your blood, health problems can develop because the excess fat and cholesterol will gather along the walls of your blood vessels, causing them to narrow. Choosing the right foods will help you control your intake of fat and cholesterol. This will help keep the levels of these substances in your blood within normal limits and reduce your risk of disease. WHAT IS MY PLAN? Your health care provider recommends that you:  Get no more than __________ % of the total calories in your daily diet from fat.  Limit your intake of saturated fat to less than ______% of your total calories each day.  Limit the amount of cholesterol in your diet to less than _________mg per day. WHAT TYPES OF FAT SHOULD I CHOOSE?  Choose healthy fats more often. Choose monounsaturated and polyunsaturated fats, such as olive and canola oil, flaxseeds, walnuts, almonds, and seeds.  Eat more omega-3 fats. Good choices include salmon, mackerel, sardines, tuna, flaxseed oil, and ground flaxseeds. Aim to eat fish at least two times a week.  Limit saturated fats. Saturated fats are primarily found in animal products, such  as meats, butter, and cream. Plant sources of saturated fats include palm oil, palm kernel oil, and coconut oil.  Avoid foods with partially hydrogenated oils in them. These contain trans fats. Examples of foods that contain trans fats are stick margarine, some tub margarines, cookies, crackers, and other baked goods. WHAT GENERAL GUIDELINES DO I NEED TO FOLLOW? These guidelines for healthy eating will help you control your intake of fat and cholesterol:  Check food labels carefully to identify foods with trans fats or high amounts of saturated fat.  Fill one half of your plate with vegetables and green salads.  Fill one fourth of your plate with whole grains. Look for the word "whole" as the first word in the ingredient list.  Fill one fourth of your plate with lean protein foods.  Limit fruit to two servings a day. Choose fruit instead of juice.  Eat more foods that contain soluble fiber. Examples of foods that contain this type of fiber are apples, broccoli, carrots, beans, peas, and barley. Aim to get 20-30 g of fiber per day.  Eat more home-cooked food and less restaurant, buffet, and fast food.  Limit or avoid alcohol.  Limit foods high in starch and sugar.  Limit fried foods.  Cook foods using methods other than frying. Baking, boiling, grilling, and broiling are all great options.  Lose weight if you are overweight. Losing just 5-10% of your initial body weight can help your overall health and prevent diseases such as diabetes and heart disease. WHAT FOODS CAN I EAT? Grains Whole  grains, such as whole wheat or whole grain breads, crackers, cereals, and pasta. Unsweetened oatmeal, bulgur, barley, quinoa, or brown rice. Corn or whole wheat flour tortillas. Vegetables Fresh or frozen vegetables (raw, steamed, roasted, or grilled). Green salads. Fruits All fresh, canned (in natural juice), or frozen fruits. Meat and Other Protein Products Ground beef (85% or leaner),  grass-fed beef, or beef trimmed of fat. Skinless chicken or [place]. Ground chicken or [place]. Pork trimmed of fat. All fish and seafood. Eggs. Dried beans, peas, or lentils. Unsalted nuts or seeds. Unsalted canned or dry beans. Dairy Low-fat dairy products, such as skim or 1% milk, 2% or reduced-fat cheeses, low-fat ricotta or cottage cheese, or plain low-fat yogurt. Fats and Oils Tub margarines without trans fats. Light or reduced-fat mayonnaise and salad dressings. Avocado. Olive, canola, sesame, or safflower oils. Natural peanut or almond butter (choose ones without added sugar and oil). The items listed above may not be a complete list of recommended foods or beverages. Contact your dietitian for more options. WHAT FOODS ARE NOT RECOMMENDED? Grains White bread. White pasta. White rice. Cornbread. Bagels, pastries, and croissants. Crackers that contain trans fat. Vegetables White potatoes. Corn. Creamed or fried vegetables. Vegetables in a cheese sauce. Fruits Dried fruits. Canned fruit in light or heavy syrup. Fruit juice. Meat and Other Protein Products Fatty cuts of meat. Ribs, chicken wings, bacon, sausage, bologna, salami, chitterlings, fatback, hot dogs, bratwurst, and packaged luncheon meats. Liver and organ meats. Dairy Whole or 2% milk, cream, half-and-half, and cream cheese. Whole milk cheeses. Whole-fat or sweetened yogurt. Full-fat cheeses. Nondairy creamers and whipped toppings. Processed cheese, cheese spreads, or cheese curds. Sweets and Desserts Corn syrup, sugars, honey, and molasses. Candy. Jam and jelly. Syrup. Sweetened cereals. Cookies, pies, cakes, donuts, muffins, and ice cream. Fats and Oils Butter, stick margarine, lard, shortening, ghee, or bacon fat. Coconut, palm kernel, or palm oils. Beverages Alcohol. Sweetened drinks (such as sodas, lemonade, and fruit drinks or punches). The items listed above may not be a complete list of foods and beverages to avoid.  Contact your dietitian for more information.   This information is not intended to replace advice given to you by your health care provider. Make sure you discuss any questions you have with your health care provider.   Document Released: 03/08/2005 Document Revised: 03/29/2014 Document Reviewed: 06/06/2013 Elsevier Interactive Patient Education 2016 [place] in the Home  Falls can cause injuries. They can happen to people of all ages. There are many things you can do to make your home safe and to help prevent falls.  WHAT CAN I DO ON THE OUTSIDE OF MY HOME?  Regularly fix the edges of walkways and driveways and fix any cracks.  Remove anything that might make you trip as you walk through a door, such as a raised step or threshold.  Trim any bushes or trees on the path to your home.  Use bright outdoor lighting.  Clear any walking paths of anything that might make someone trip, such as rocks or tools.  Regularly check to see if handrails are loose or broken. Make sure that both sides of any steps have handrails.  Any raised decks and porches should have guardrails on the edges.  Have any leaves, snow, or ice cleared regularly.  Use sand or salt on walking paths during winter.  Clean up any spills in your garage right away. This includes oil or grease spills. WHAT CAN I DO IN THE BATHROOM?  Use night lights.  Install grab bars by the toilet and in the tub and shower. Do not use towel bars as grab bars.  Use non-skid mats or decals in the tub or shower.  If you need to sit down in the shower, use a plastic, non-slip stool.  Keep the floor dry. Clean up any water that spills on the floor as soon as it happens.  Remove soap buildup in the tub or shower regularly.  Attach bath mats securely with double-sided non-slip rug tape.  Do not have throw rugs and other things on the floor that can make you trip. WHAT CAN I DO IN THE BEDROOM?  Use night  lights.  Make sure that you have a light by your bed that is easy to reach.  Do not use any sheets or blankets that are too big for your bed. They should not hang down onto the floor.  Have a firm chair that has side arms. You can use this for support while you get dressed.  Do not have throw rugs and other things on the floor that can make you trip. WHAT CAN I DO IN THE KITCHEN?  Clean up any spills right away.  Avoid walking on wet floors.  Keep items that you use a lot in easy-to-reach places.  If you need to reach something above you, use a strong step stool that has a grab bar.  Keep electrical cords out of the way.  Do not use floor polish or wax that makes floors slippery. If you must use wax, use non-skid floor wax.  Do not have throw rugs and other things on the floor that can make you trip. WHAT CAN I DO WITH MY STAIRS?  Do not leave any items on the stairs.  Make sure that there are handrails on both sides of the stairs and use them. Fix handrails that are broken or loose. Make sure that handrails are as long as the stairways.  Check any carpeting to make sure that it is firmly attached to the stairs. Fix any carpet that is loose or worn.  Avoid having throw rugs at the top or bottom of the stairs. If you do have throw rugs, attach them to the floor with carpet tape.  Make sure that you have a light switch at the top of the stairs and the bottom of the stairs. If you do not have them, ask someone to add them for you. WHAT ELSE CAN I DO TO HELP PREVENT FALLS?  Wear shoes that:  Do not have high heels.  Have rubber bottoms.  Are comfortable and fit you well.  Are closed at the toe. Do not wear sandals.  If you use a stepladder:  Make sure that it is fully opened. Do not climb a closed stepladder.  Make sure that both sides of the stepladder are locked into place.  Ask someone to hold it for you, if possible.  Clearly mark and make sure that you can  see:  Any grab bars or handrails.  First and last steps.  Where the edge of each step is.  Use tools that help you move around (mobility aids) if they are needed. These include:  Canes.  Walkers.  Scooters.  Crutches.  Turn on the lights when you go into a dark area. Replace any light bulbs as soon as they burn out.  Set up your furniture so you have a clear path. Avoid moving your furniture around.  If any  of your floors are uneven, fix them.  If there are any pets around you, be aware of where they are.  Review your medicines with your doctor. Some medicines can make you feel dizzy. This can increase your chance of falling. Ask your doctor what other things that you can do to help prevent falls.   This information is not intended to replace advice given to you by your health care provider. Make sure you discuss any questions you have with your health care provider.   Document Released: 01/02/2009 Document Revised: 07/23/2014 Document Reviewed: 04/12/2014 Elsevier Interactive Patient Education 2016 [employer].  Hearing Loss Hearing loss is a partial or total loss of the ability to hear. This can be temporary or permanent, and it can happen in one or both ears. Hearing loss may be referred to as deafness. Medical care is necessary to treat hearing loss properly and to prevent the condition from getting worse. Your hearing may partially or completely come back, depending on what caused your hearing loss and how severe it is. In some cases, hearing loss is permanent. CAUSES Common causes of hearing loss include:   Too much wax in the ear canal.   Infection of the ear canal or middle ear.   Fluid in the middle ear.   Injury to the ear or surrounding area.   An object stuck in the ear.   Prolonged exposure to loud sounds, such as music.  Less common causes of hearing loss include:   Tumors in the ear.   Viral or bacterial infections, such as meningitis.    A hole in the eardrum (perforated eardrum).  Problems with the hearing nerve that sends signals between the brain and the ear.  Certain medicines.  SYMPTOMS  Symptoms of this condition may include:  Difficulty telling the difference between sounds.  Difficulty following a conversation when there is background noise.  Lack of response to sounds in your environment. This may be most noticeable when you do not respond to startling sounds.  Needing to turn up the volume on the television, radio, etc.  Ringing in the ears.  Dizziness.  Pain in the ears. DIAGNOSIS This condition is diagnosed based on a physical exam and a hearing test (audiometry). The audiometry test will be performed by a hearing specialist (audiologist). You may also be referred to an ear, nose, and throat (ENT) specialist (otolaryngologist).  TREATMENT Treatment for recent onset of hearing loss may include:   Ear wax removal.   Being prescribed medicines to prevent infection (antibiotics).   Being prescribed medicines to reduce inflammation (corticosteroids).  HOME CARE INSTRUCTIONS  If you were prescribed an antibiotic medicine, take it as told by your health care provider. Do not stop taking the antibiotic even if you start to feel better.  Take over-the-counter and prescription medicines only as told by your health care provider.  Avoid loud noises.   Return to your normal activities as told by your health care provider. Ask your health care provider what activities are safe for you.  Keep all follow-up visits as told by your health care provider. This is important. SEEK MEDICAL CARE IF:   You feel dizzy.   You develop new symptoms.   You vomit or feel nauseous.   You have a fever.  SEEK IMMEDIATE MEDICAL CARE IF:  You develop sudden changes in your vision.   You have severe ear pain.   You have new or increased weakness.  You have a severe headache.  This information is  not intended to replace advice given to you by your health care provider. Make sure you discuss any questions you have with your health care provider.   Document Released: 03/08/2005 Document Revised: 11/27/2014 Document Reviewed: 07/24/2014 Elsevier Interactive Patient Education 2016 [place] A mammogram is an X-ray of the breasts that is done to check for changes that are not normal. This test can screen for and find any changes that may suggest breast cancer. This test can also help to find other changes and variations in the breast. BEFORE THE PROCEDURE  Have this test done about 1-2 weeks after your period. This is usually when your breasts are the least tender.  If you are visiting a new doctor or clinic, send any past mammogram images to your new doctor's office.  Wash your breasts and under your arms the day of the test.  Do not use deodorants, perfumes, lotions, or powders on the day of the test.  Take off any jewelry from your neck.  Wear clothes that you can change into and out of easily. PROCEDURE  You will undress from the waist up. You will put on a gown.  You will stand in front of the X-ray machine.  Each breast will be placed between two plastic or glass plates. The plates will press down on your breast for a few seconds. Try to stay as relaxed as possible. This does not cause any harm to your breasts. Any discomfort you feel will be very brief.  X-rays will be taken from different angles of each breast. The procedure may vary among doctors and hospitals. AFTER THE PROCEDURE  The mammogram will be looked at by a specialist (radiologist).  You may need to do certain parts of the test again. This depends on the quality of the images.  Ask when your test results will be ready. Make sure you get your test results.  You may go back to your normal activities.   This information is not intended to replace advice given to you by your health care  provider. Make sure you discuss any questions you have with your health care provider.   Document Released: 05/31/2011 Document Revised: 11/27/2014 Document Reviewed: 05/17/2014 Elsevier Interactive Patient Education [employer].

## 2015-01-20 ENCOUNTER — Ambulatory Visit (INDEPENDENT_AMBULATORY_CARE_PROVIDER_SITE_OTHER): Payer: Medicare Other | Admitting: Internal Medicine

## 2015-01-20 ENCOUNTER — Encounter: Payer: Self-pay | Admitting: Internal Medicine

## 2015-01-20 VITALS — BP 131/80 | HR 66 | Temp 98.2°F | Ht 66.0 in | Wt 204.4 lb

## 2015-01-20 DIAGNOSIS — E669 Obesity, unspecified: Secondary | ICD-10-CM

## 2015-01-20 NOTE — Assessment & Plan Note (Signed)
Wt Readings from Last 3 Encounters:  01/20/15 204 lb 6 oz (92.704 kg)  01/07/15 203 lb 12.8 oz (92.443 kg)  12/23/14 204 lb 6 oz (92.704 kg)   Body mass index is 33 kg/(m^2). Encouraged her to limit intake of processed foods. Discussed several resources for recipes. Encouraged her to consider an 8hr fast during day. Continue exercise as tolerated. Follow up 3 months and prn.

## 2015-01-20 NOTE — Progress Notes (Signed)
Pre visit review using our clinic review tool, if applicable. No additional management support is needed unless otherwise documented below in the visit note. 

## 2015-01-20 NOTE — Patient Instructions (Signed)
Consider reading the book "Obesity Code."  Continue healthy diet and exercise.

## 2015-01-20 NOTE — Progress Notes (Signed)
Subjective:    Patient ID: Alexandra Bishop, female    DOB: [birth year], 69 y.o.   MRN: [id number]  HPI  69YO female presents for follow up.  Last seen 10/3 for obesity.  Doing some raking leaves recently. Has increased ankle pain with this. Taking Gabapentin with some improvement.  Obesity - Trying to limit calories to about 1000 per day. Did not bring record of caloric intake today. However, has not been able to give up processed foods, such as breads. Exercise limited by chronic pain.  Wt Readings from Last 3 Encounters:  01/20/15 204 lb 6 oz (92.704 kg)  01/07/15 203 lb 12.8 oz (92.443 kg)  12/23/14 204 lb 6 oz (92.704 kg)   BP Readings from Last 3 Encounters:  01/20/15 131/80  01/07/15 138/72  12/23/14 125/75    Past Medical History  Diagnosis Date  . Hypothyroidism    Family History  Problem Relation Age of Onset  . Cancer      family hx  . Thyroid disease      family hx  . Cancer Mother     sarcoma  . Heart disease Brother   . Cancer Maternal Grandmother     breast  . Cancer Paternal Grandmother     breast  . Thyroid cancer Neg Hx    Past Surgical History  Procedure Laterality Date  . Broken ankle - rt    . Bunionectomy  2015   Social History   Social History  . Marital Status: Married    Spouse Name: N/A  . Number of Children: N/A  . Years of Education: N/A   Social History Main Topics  . Smoking status: Never Smoker   . Smokeless tobacco: Never Used     Comment: tobacco use - no   . Alcohol Use: No  . Drug Use: No  . Sexual Activity: Not Currently   Other Topics Concern  . None   Social History Narrative   Married; retired - office work; gets regular exercise - walks a mile most days.     Review of Systems  Constitutional: Negative for fever, chills, appetite change, fatigue and unexpected weight change.  Eyes: Negative for visual disturbance.  Respiratory: Negative for shortness of breath and wheezing.   Cardiovascular: Negative for  chest pain, palpitations and leg swelling.  Gastrointestinal: Negative for nausea, vomiting, abdominal pain, diarrhea and constipation.  Musculoskeletal: Positive for myalgias, back pain and arthralgias.  Skin: Negative for color change and rash.  Hematological: Negative for adenopathy. Does not bruise/bleed easily.  Psychiatric/Behavioral: Negative for sleep disturbance and dysphoric mood. The patient is not nervous/anxious.        Objective:    BP 131/80 mmHg  Pulse 66  Temp(Src) 98.2 F (36.8 C) (Oral)  Ht 5\' 6"  (1.676 m)  Wt 204 lb 6 oz (92.704 kg)  BMI 33.00 kg/m2  SpO2 96% Physical Exam  Constitutional: She is oriented to person, place, and time. She appears well-developed and well-nourished. No distress.  HENT:  Head: Normocephalic and atraumatic.  Right Ear: External ear normal.  Left Ear: External ear normal.  Nose: Nose normal.  Mouth/Throat: Oropharynx is clear and moist. No oropharyngeal exudate.  Eyes: Conjunctivae are normal. Pupils are equal, round, and reactive to light. Right eye exhibits no discharge. Left eye exhibits no discharge. No scleral icterus.  Neck: Normal range of motion. Neck supple. No tracheal deviation present. No thyromegaly present.  Cardiovascular: Normal rate, regular rhythm, normal heart sounds and intact distal  pulses.  Exam reveals no gallop and no friction rub.   No murmur heard. Pulmonary/Chest: Effort normal and breath sounds normal. No respiratory distress. She has no wheezes. She has no rales. She exhibits no tenderness.  Musculoskeletal: Normal range of motion. She exhibits no edema or tenderness.  Lymphadenopathy:    She has no cervical adenopathy.  Neurological: She is alert and oriented to person, place, and time. No cranial nerve deficit. She exhibits normal muscle tone. Coordination normal.  Skin: Skin is warm and dry. No rash noted. She is not diaphoretic. No erythema. No pallor.  Psychiatric: She has a normal mood and affect.  Her behavior is normal. Judgment and thought content normal.          Assessment & Plan:  Over 83min of which >50% spent in face-to-face contact with patient discussing plan of care  Problem List Items Addressed This Visit      Unprioritized   Obesity - Primary    Wt Readings from Last 3 Encounters:  01/20/15 204 lb 6 oz (92.704 kg)  01/07/15 203 lb 12.8 oz (92.443 kg)  12/23/14 204 lb 6 oz (92.704 kg)   Body mass index is 33 kg/(m^2). Encouraged her to limit intake of processed foods. Discussed several resources for recipes. Encouraged her to consider an 8hr fast during day. Continue exercise as tolerated. Follow up 3 months and prn.          Return in about 3 months (around 04/22/2015) for Recheck.

## 2015-01-22 ENCOUNTER — Ambulatory Visit
Admission: RE | Admit: 2015-01-22 | Discharge: 2015-01-22 | Disposition: A | Discharge status: Home / Self Care | Payer: Medicare Other | Source: Ambulatory Visit | Attending: Internal Medicine | Admitting: Internal Medicine

## 2015-01-22 ENCOUNTER — Other Ambulatory Visit: Payer: Self-pay | Admitting: Internal Medicine

## 2015-01-22 DIAGNOSIS — Z1239 Encounter for other screening for malignant neoplasm of breast: Secondary | ICD-10-CM

## 2015-01-22 DIAGNOSIS — Z1231 Encounter for screening mammogram for malignant neoplasm of breast: Secondary | ICD-10-CM | POA: Diagnosis not present

## 2015-01-31 ENCOUNTER — Other Ambulatory Visit: Payer: Self-pay | Admitting: Internal Medicine

## 2015-03-19 ENCOUNTER — Encounter: Payer: Self-pay | Admitting: *Deleted

## 2015-04-23 ENCOUNTER — Encounter: Payer: Self-pay | Admitting: Internal Medicine

## 2015-04-23 ENCOUNTER — Ambulatory Visit (INDEPENDENT_AMBULATORY_CARE_PROVIDER_SITE_OTHER): Payer: Medicare Other | Admitting: Internal Medicine

## 2015-04-23 VITALS — BP 101/71 | HR 65 | Temp 98.2°F | Ht 66.0 in | Wt 205.2 lb

## 2015-04-23 DIAGNOSIS — E039 Hypothyroidism, unspecified: Secondary | ICD-10-CM | POA: Diagnosis not present

## 2015-04-23 DIAGNOSIS — I10 Essential (primary) hypertension: Secondary | ICD-10-CM

## 2015-04-23 DIAGNOSIS — L409 Psoriasis, unspecified: Secondary | ICD-10-CM

## 2015-04-23 DIAGNOSIS — G4700 Insomnia, unspecified: Secondary | ICD-10-CM

## 2015-04-23 DIAGNOSIS — M2550 Pain in unspecified joint: Secondary | ICD-10-CM

## 2015-04-23 DIAGNOSIS — E669 Obesity, unspecified: Secondary | ICD-10-CM

## 2015-04-23 LAB — COMPREHENSIVE METABOLIC PANEL
ALT: 19 U/L (ref 0–35)
AST: 22 U/L (ref 0–37)
Albumin: 4.3 g/dL (ref 3.5–5.2)
Alkaline Phosphatase: 99 U/L (ref 39–117)
BUN: 17 mg/dL (ref 6–23)
CO2: 26 mEq/L (ref 19–32)
Calcium: 9.7 mg/dL (ref 8.4–10.5)
Chloride: 103 mEq/L (ref 96–112)
Creatinine, Ser: 0.86 mg/dL (ref 0.40–1.20)
GFR: 69 mL/min (ref >60.00)
Glucose, Bld: 110 mg/dL — ABNORMAL HIGH (ref 70–99)
Potassium: 4.1 mEq/L (ref 3.5–5.1)
Sodium: 138 mEq/L (ref 135–145)
Total Bilirubin: 0.5 mg/dL (ref 0.2–1.2)
Total Protein: 7 g/dL (ref 6.0–8.3)

## 2015-04-23 LAB — TSH: TSH: 1.73 u[IU]/mL (ref 0.35–4.50)

## 2015-04-23 NOTE — Assessment & Plan Note (Signed)
Chronic arthralgia. Encouraged regular physical activity. She has completed PT and has been intolerant of NSAIDS, so continues on Neurontin for pain.

## 2015-04-23 NOTE — Assessment & Plan Note (Signed)
Thyroid levels today normal. Continue Levothyroxine.

## 2015-04-23 NOTE — Progress Notes (Signed)
Subjective:    Patient ID: Alexandra Bishop, female    DOB: 03/20/1946, 70 y.o.   MRN: [id number]  HPI  70YO female presents for follow up.  Recently had viral upper respiratory infection. Symptoms of congestion improving.  Obesity - Continues to struggle with weight. Trying some intermittent fasting. Some dietary indiscretion over the Holidays. Continues to drink orange juice for breakfast. Notes increased intake of bread. Trying to limit sugars. Not exercising.  Had another fall. Tripped on Birkenstocks. No known injury.  Psoriasis - Would like referral back to [facility]. Unhappy that previous dermatologist only wanted yearly follow up.  Insomnia - Tried increasing Gabapentin at night and had increased nightmares. Currently wakes about 3am, has trouble getting back to sleep. However wants to wake up at [place] from Last 3 Encounters:  04/23/15 205 lb 4 oz (93.101 kg)  01/20/15 204 lb 6 oz (92.704 kg)  01/07/15 203 lb 12.8 oz (92.443 kg)   BP Readings from Last 3 Encounters:  04/23/15 101/71  01/20/15 131/80  01/07/15 138/72    Past Medical History  Diagnosis Date  . Hypothyroidism    Family History  Problem Relation Age of Onset  . Cancer      family hx  . Thyroid disease      family hx  . Cancer Mother     sarcoma  . Heart disease Brother   . Cancer Maternal Grandmother     breast  . Breast cancer Maternal Grandmother   . Cancer Paternal Grandmother     breast  . Thyroid cancer Neg Hx    Past Surgical History  Procedure Laterality Date  . Broken ankle - rt    . Bunionectomy  2015   Social History   Social History  . Marital Status: Married    Spouse Name: N/A  . Number of Children: N/A  . Years of Education: N/A   Social History Main Topics  . Smoking status: Never Smoker   . Smokeless tobacco: Never Used     Comment: tobacco use - no   . Alcohol Use: No  . Drug Use: No  . Sexual Activity: Not Currently   Other Topics Concern  .  None   Social History Narrative   Married; retired - office work; gets regular exercise - walks a mile most days.     Review of Systems  Constitutional: Negative for fever, chills, appetite change, fatigue and unexpected weight change.  HENT: Positive for congestion, postnasal drip, rhinorrhea and sneezing. Negative for sore throat, trouble swallowing and voice change.   Eyes: Negative for visual disturbance.  Respiratory: Negative for cough and shortness of breath.   Cardiovascular: Negative for chest pain, palpitations and leg swelling.  Gastrointestinal: Negative for nausea, vomiting, abdominal pain, diarrhea and constipation.  Musculoskeletal: Positive for myalgias, back pain and arthralgias.  Skin: Negative for color change and rash.  Hematological: Negative for adenopathy. Does not bruise/bleed easily.  Psychiatric/Behavioral: Positive for sleep disturbance. Negative for dysphoric mood. The patient is not nervous/anxious.        Objective:    BP 101/71 mmHg  Pulse 65  Temp(Src) 98.2 F (36.8 C) (Oral)  Ht 5\' 6"  (1.676 m)  Wt 205 lb 4 oz (93.101 kg)  BMI 33.14 kg/m2  SpO2 95% Physical Exam  Constitutional: She is oriented to person, place, and time. She appears well-developed and well-nourished. No distress.  HENT:  Head: Normocephalic and atraumatic.  Right Ear: External ear normal.  Left Ear: External ear normal.  Nose: Nose normal.  Mouth/Throat: Oropharynx is clear and moist. No oropharyngeal exudate.  Eyes: Conjunctivae are normal. Pupils are equal, round, and reactive to light. Right eye exhibits no discharge. Left eye exhibits no discharge. No scleral icterus.  Neck: Normal range of motion. Neck supple. No tracheal deviation present. No thyromegaly present.  Cardiovascular: Normal rate, regular rhythm, normal heart sounds and intact distal pulses.  Exam reveals no gallop and no friction rub.   No murmur heard. Pulmonary/Chest: Effort normal and breath sounds  normal. No respiratory distress. She has no wheezes. She has no rales. She exhibits no tenderness.  Musculoskeletal: Normal range of motion. She exhibits no edema or tenderness.  Lymphadenopathy:    She has no cervical adenopathy.  Neurological: She is alert and oriented to person, place, and time. No cranial nerve deficit. She exhibits normal muscle tone. Coordination normal.  Skin: Skin is warm and dry. No rash noted. She is not diaphoretic. No erythema. No pallor.  Psychiatric: She has a normal mood and affect. Her behavior is normal. Judgment and thought content normal.          Assessment & Plan:   Problem List Items Addressed This Visit      Unprioritized   Arthralgia    Chronic arthralgia. Encouraged regular physical activity. She has completed PT and has been intolerant of NSAIDS, so continues on Neurontin for pain.       Hypertension    BP Readings from Last 3 Encounters:  04/23/15 101/71  01/20/15 131/80  01/07/15 138/72   BP well controlled. Renal function normal today.      Relevant Orders   Comprehensive metabolic panel (Completed)   Hypothyroidism    Thyroid levels today normal. Continue Levothyroxine.      Relevant Orders   TSH (Completed)   Insomnia    Chronic. Encouraged more physical activity during the day to help with sleep at night.      Obesity - Primary    Wt Readings from Last 3 Encounters:  04/23/15 205 lb 4 oz (93.101 kg)  01/20/15 204 lb 6 oz (92.704 kg)  01/07/15 203 lb 12.8 oz (92.443 kg)   No change in weight. Continues to eat high sugar foods and does not exercise. Encouraged her to limit sugar intake and exercise.      Psoriasis    Will set up referral to [facility] per her request.      Relevant Orders   Ambulatory referral to Dermatology       Return in about 6 months (around 10/21/2015) for Physical.

## 2015-04-23 NOTE — Assessment & Plan Note (Signed)
Will set up referral to [facility] per her request.

## 2015-04-23 NOTE — Assessment & Plan Note (Signed)
Wt Readings from Last 3 Encounters:  04/23/15 205 lb 4 oz (93.101 kg)  01/20/15 204 lb 6 oz (92.704 kg)  01/07/15 203 lb 12.8 oz (92.443 kg)   No change in weight. Continues to eat high sugar foods and does not exercise. Encouraged her to limit sugar intake and exercise.

## 2015-04-23 NOTE — Patient Instructions (Signed)
Labs today

## 2015-04-23 NOTE — Assessment & Plan Note (Signed)
BP Readings from Last 3 Encounters:  04/23/15 101/71  01/20/15 131/80  01/07/15 138/72   BP well controlled. Renal function normal today.

## 2015-04-23 NOTE — Progress Notes (Signed)
Pre visit review using our clinic review tool, if applicable. No additional management support is needed unless otherwise documented below in the visit note. 

## 2015-04-23 NOTE — Assessment & Plan Note (Signed)
Chronic. Encouraged more physical activity during the day to help with sleep at night.

## 2015-05-19 ENCOUNTER — Telehealth: Payer: Self-pay | Admitting: Surgical

## 2015-05-19 NOTE — Telephone Encounter (Signed)
Gave patient message from Dr.Walker and she is going to see if she can find medicine cheaper at another pharmacy

## 2015-05-19 NOTE — Telephone Encounter (Signed)
Patient came in and spoke with Lavella Lemons about medication. Patient has been taking Doxycycline since 05/10/11 and pharmacy says that it is in the wrong tier so will cost over $100. Patient would like to know if she can stop taking. Her Clobetasol Propionate that she has been taking is not covered at all and she would like to know if there is a home remedy for this.

## 2015-05-19 NOTE — Telephone Encounter (Signed)
Doxycycline is generic. It is used to help control her rosacea. She can try stopping, but I would also recommend she shop around at pharmacies. There is not a home remedy to replace Clobetasol.

## 2015-05-27 ENCOUNTER — Telehealth: Payer: Self-pay

## 2015-05-27 NOTE — Telephone Encounter (Signed)
Pt is requesting copies of her Rx's to shop around for pricing. I also gave the pt a copy of [place] which is a website that help shop for the cheapest amount for medications in their surrounding area

## 2015-05-30 ENCOUNTER — Telehealth: Payer: Self-pay | Admitting: Internal Medicine

## 2015-05-30 NOTE — Telephone Encounter (Signed)
LVTCB

## 2015-05-30 NOTE — Telephone Encounter (Signed)
I reviewed your note. 1. Amlodipine 10mg  daily should be much less expensive than $195 per 3 months. This is a generic drug and has been on the $4 list at [facility]. I would shop around. 2. I would recommend follow up with her dermatologist regarding the Temovate and Doxycycline. These are both used for skin conditions. Doxycycline is generic, but has gone up in price over the years.  Both medications can be stopped until seen by dermatology.

## 2015-05-30 NOTE — Telephone Encounter (Signed)
Pt came in wanting to get a medication list printed out so she could let us know the medicines that  she wanted to stop taking due to price.Hanley Seamen to [place].. Pt has a appt coming up to discuss this.. Please advise

## 2015-05-30 NOTE — Telephone Encounter (Signed)
Patient was informed of Dr.Walker's recommendations. She was told to try [facility], [facility], and [facility]. She was todl to also call insurance about what pharmacy they will use.

## 2015-05-30 NOTE — Telephone Encounter (Signed)
Patient marked that she was unable to afford Temovate 0.05% $62.44. She also stated Norvasc was $195. She would like to talk about these medications at her appointment.

## 2015-06-12 ENCOUNTER — Ambulatory Visit: Payer: Medicare Other | Admitting: Internal Medicine

## 2015-06-12 ENCOUNTER — Ambulatory Visit (INDEPENDENT_AMBULATORY_CARE_PROVIDER_SITE_OTHER): Payer: Medicare Other | Admitting: Internal Medicine

## 2015-06-12 ENCOUNTER — Encounter: Payer: Self-pay | Admitting: Internal Medicine

## 2015-06-12 VITALS — BP 94/69 | HR 65 | Temp 97.9°F | Ht 66.0 in | Wt 208.4 lb

## 2015-06-12 DIAGNOSIS — M2550 Pain in unspecified joint: Secondary | ICD-10-CM | POA: Diagnosis not present

## 2015-06-12 DIAGNOSIS — L719 Rosacea, unspecified: Secondary | ICD-10-CM

## 2015-06-12 DIAGNOSIS — I10 Essential (primary) hypertension: Secondary | ICD-10-CM | POA: Diagnosis not present

## 2015-06-12 DIAGNOSIS — E669 Obesity, unspecified: Secondary | ICD-10-CM | POA: Diagnosis not present

## 2015-06-12 MED ORDER — LEVOTHYROXINE SODIUM 100 MCG PO TABS: 100.0000 ug | ORAL_TABLET | Freq: Every day | ORAL | Status: DC

## 2015-06-12 NOTE — Assessment & Plan Note (Signed)
Wt Readings from Last 3 Encounters:  06/12/15 208 lb 6 oz (94.518 kg)  04/23/15 205 lb 4 oz (93.101 kg)  01/20/15 204 lb 6 oz (92.704 kg)   Body mass index is 33.65 kg/(m^2). Encouraged her to consider Whole 30 style diet. Encouraged physical activity.

## 2015-06-12 NOTE — Assessment & Plan Note (Signed)
On doxycycline, but unable to afford. Encouraged follow up with dermatology as scheduled.

## 2015-06-12 NOTE — Patient Instructions (Signed)
Follow up after dermatology visit.

## 2015-06-12 NOTE — Progress Notes (Signed)
Pre visit review using our clinic review tool, if applicable. No additional management support is needed unless otherwise documented below in the visit note. 

## 2015-06-12 NOTE — Progress Notes (Signed)
Subjective:    Patient ID: Alexandra Bishop, female    DOB: Aug 03, 1945, 70 y.o.   MRN: [id number]  HPI  70YO female presents for follow up.  Concerned about costs of medication. Unable to afford Doxycycline. Has evaluation with Dr. Nicole Kindred pending.  Trying to fast in effort to help with weight loss. Not going well. Continues to have a diet high in carbohydrates. Planning to get back walking.  Arthralgia - Continues to take Gabapentin with some improvement.    Wt Readings from Last 3 Encounters:  06/12/15 208 lb 6 oz (94.518 kg)  04/23/15 205 lb 4 oz (93.101 kg)  01/20/15 204 lb 6 oz (92.704 kg)   BP Readings from Last 3 Encounters:  06/12/15 94/69  04/23/15 101/71  01/20/15 131/80    Past Medical History  Diagnosis Date  . Hypothyroidism    Family History  Problem Relation Age of Onset  . Cancer      family hx  . Thyroid disease      family hx  . Cancer Mother     sarcoma  . Heart disease Brother   . Cancer Maternal Grandmother     breast  . Breast cancer Maternal Grandmother   . Cancer Paternal Grandmother     breast  . Thyroid cancer Neg Hx    Past Surgical History  Procedure Laterality Date  . Broken ankle - rt    . Bunionectomy  2015   Social History   Social History  . Marital Status: Married    Spouse Name: N/A  . Number of Children: N/A  . Years of Education: N/A   Social History Main Topics  . Smoking status: Never Smoker   . Smokeless tobacco: Never Used     Comment: tobacco use - no   . Alcohol Use: No  . Drug Use: No  . Sexual Activity: Not Currently   Other Topics Concern  . None   Social History Narrative   Married; retired - office work; gets regular exercise - walks a mile most days.     Review of Systems  Constitutional: Negative for fever, chills, appetite change, fatigue and unexpected weight change.  Eyes: Negative for visual disturbance.  Respiratory: Negative for cough and shortness of breath.   Cardiovascular:  Negative for chest pain, palpitations and leg swelling.  Gastrointestinal: Negative for nausea, vomiting, abdominal pain, diarrhea and constipation.  Musculoskeletal: Positive for myalgias and arthralgias.  Skin: Negative for color change and rash.  Hematological: Negative for adenopathy. Does not bruise/bleed easily.  Psychiatric/Behavioral: Negative for sleep disturbance and dysphoric mood. The patient is not nervous/anxious.        Objective:    BP 94/69 mmHg  Pulse 65  Temp(Src) 97.9 F (36.6 C) (Oral)  Ht 5\' 6"  (1.676 m)  Wt 208 lb 6 oz (94.518 kg)  BMI 33.65 kg/m2  SpO2 99% Physical Exam  Constitutional: She is oriented to person, place, and time. She appears well-developed and well-nourished. No distress.  HENT:  Head: Normocephalic and atraumatic.  Right Ear: External ear normal.  Left Ear: External ear normal.  Nose: Nose normal.  Mouth/Throat: Oropharynx is clear and moist. No oropharyngeal exudate.  Eyes: Conjunctivae are normal. Pupils are equal, round, and reactive to light. Right eye exhibits no discharge. Left eye exhibits no discharge. No scleral icterus.  Neck: Normal range of motion. Neck supple. No tracheal deviation present. No thyromegaly present.  Cardiovascular: Normal rate, regular rhythm, normal heart sounds and intact distal pulses.  Exam reveals no gallop and no friction rub.   No murmur heard. Pulmonary/Chest: Effort normal and breath sounds normal. No respiratory distress. She has no wheezes. She has no rales. She exhibits no tenderness.  Musculoskeletal: Normal range of motion. She exhibits no edema or tenderness.  Lymphadenopathy:    She has no cervical adenopathy.  Neurological: She is alert and oriented to person, place, and time. No cranial nerve deficit. She exhibits normal muscle tone. Coordination normal.  Skin: Skin is warm and dry. No rash noted. She is not diaphoretic. No erythema. No pallor.  Psychiatric: She has a normal mood and affect.  Her behavior is normal. Judgment and thought content normal.          Assessment & Plan:   Problem List Items Addressed This Visit      Unprioritized   Arthralgia - Primary    Chronic OA. Will continue gabapentin. Unable to take NSAIDS because of gastritis in past.      Hypertension    BP Readings from Last 3 Encounters:  06/12/15 94/69  04/23/15 101/71  01/20/15 131/80   BP well controlled. Reviewed that Amlodipine is on $4 list at [facility]. She will look into this.      Obesity    Wt Readings from Last 3 Encounters:  06/12/15 208 lb 6 oz (94.518 kg)  04/23/15 205 lb 4 oz (93.101 kg)  01/20/15 204 lb 6 oz (92.704 kg)   Body mass index is 33.65 kg/(m^2). Encouraged her to consider Whole 30 style diet. Encouraged physical activity.      Rosacea    On doxycycline, but unable to afford. Encouraged follow up with dermatology as scheduled.          Return for Recheck.  Ronette Deter, MD [facility]

## 2015-06-12 NOTE — Assessment & Plan Note (Signed)
Chronic OA. Will continue gabapentin. Unable to take NSAIDS because of gastritis in past.

## 2015-06-12 NOTE — Assessment & Plan Note (Signed)
BP Readings from Last 3 Encounters:  06/12/15 94/69  04/23/15 101/71  01/20/15 131/80   BP well controlled. Reviewed that Amlodipine is on $4 list at [facility]. She will look into this.

## 2015-07-01 DIAGNOSIS — I7300 Raynaud's syndrome without gangrene: Secondary | ICD-10-CM | POA: Diagnosis not present

## 2015-07-01 DIAGNOSIS — L718 Other rosacea: Secondary | ICD-10-CM | POA: Diagnosis not present

## 2015-07-01 DIAGNOSIS — L400 Psoriasis vulgaris: Secondary | ICD-10-CM | POA: Diagnosis not present

## 2015-07-09 ENCOUNTER — Ambulatory Visit (INDEPENDENT_AMBULATORY_CARE_PROVIDER_SITE_OTHER): Payer: Medicare Other | Admitting: Podiatry

## 2015-07-09 DIAGNOSIS — M7751 Other enthesopathy of right foot: Secondary | ICD-10-CM | POA: Diagnosis not present

## 2015-07-09 DIAGNOSIS — M779 Enthesopathy, unspecified: Principal | ICD-10-CM

## 2015-07-09 DIAGNOSIS — M778 Other enthesopathies, not elsewhere classified: Secondary | ICD-10-CM

## 2015-07-10 NOTE — Progress Notes (Signed)
She presents today for follow-up , a one-year follow-up of her bilateral foot. She brings orthotics with her questioning whether they fit in her shoes or not. They've never been worn. She does have orthotics in her shoes that she wears regularly but her shoes she states her old and need to be replaced. She states that she doesn't have any foot pain at this point just wants [place] to check a look and make sure she is doing well.   Objective: vital signs are stable she is alert and oriented 3. Pulses are palpable neurologic sensorium is intact deep tendon reflexes are intact muscle strength is normal bilateral. Surgical site fifth metatarsal does not demonstrate any type of pain and has gone on to heal uneventfully the scar is almost unrecognizable. Cutaneous evaluation demonstrates mild edema about the ankle but none in the forefoot. Radiographs were not taken today as they were not indicated.   Assessment: well-healed surgical foot  Non-complicated.   Plan: I placed her unused orthotics in her tissues and then fit well I recommended that she purchase a new pair shoes either new balance or Brooks with using her orthotics. I will follow-up with her 1 year.

## 2015-07-28 ENCOUNTER — Other Ambulatory Visit: Payer: Self-pay | Admitting: Internal Medicine

## 2015-08-11 DIAGNOSIS — S61210A Laceration without foreign body of right index finger without damage to nail, initial encounter: Secondary | ICD-10-CM | POA: Diagnosis not present

## 2015-08-11 DIAGNOSIS — Z23 Encounter for immunization: Secondary | ICD-10-CM | POA: Diagnosis not present

## 2015-08-18 ENCOUNTER — Other Ambulatory Visit: Payer: Self-pay | Admitting: Internal Medicine

## 2015-08-22 ENCOUNTER — Telehealth: Payer: Self-pay | Admitting: Internal Medicine

## 2015-08-22 NOTE — Telephone Encounter (Signed)
Conveyed message to  Patient.

## 2015-08-22 NOTE — Telephone Encounter (Signed)
Continue the Whole 30. I am not familiar with Whole 150. Exercise by walking or biking 59min 5 days per week. Start weight training.

## 2015-08-22 NOTE — Telephone Encounter (Signed)
Pt came into office and left the following message for Dr. Gilford Rile; Alexandra Bishop) sneakers-no squeak w/powder. Have completed a Whole 30- month (with [name]) Wish I felt perkier. Pt wants to know how Dr. Gilford Rile feels about Whole 150? Is there anyone she could follow up with?

## 2015-11-04 ENCOUNTER — Encounter: Payer: Medicare Other | Admitting: Family Medicine

## 2015-11-04 ENCOUNTER — Encounter: Payer: Medicare Other | Admitting: Internal Medicine

## 2015-11-04 ENCOUNTER — Encounter: Payer: Medicare Other | Admitting: Family

## 2015-11-12 ENCOUNTER — Ambulatory Visit (INDEPENDENT_AMBULATORY_CARE_PROVIDER_SITE_OTHER): Payer: Medicare Other | Admitting: Family

## 2015-11-12 ENCOUNTER — Encounter: Payer: Self-pay | Admitting: Family

## 2015-11-12 VITALS — BP 116/76 | HR 60 | Temp 98.0°F | Resp 16 | Ht 65.0 in | Wt 188.0 lb

## 2015-11-12 DIAGNOSIS — Z1231 Encounter for screening mammogram for malignant neoplasm of breast: Secondary | ICD-10-CM | POA: Diagnosis not present

## 2015-11-12 DIAGNOSIS — Z7189 Other specified counseling: Secondary | ICD-10-CM | POA: Diagnosis not present

## 2015-11-12 DIAGNOSIS — M48061 Spinal stenosis, lumbar region without neurogenic claudication: Secondary | ICD-10-CM

## 2015-11-12 DIAGNOSIS — R05 Cough: Secondary | ICD-10-CM | POA: Insufficient documentation

## 2015-11-12 DIAGNOSIS — M545 Low back pain, unspecified: Secondary | ICD-10-CM

## 2015-11-12 DIAGNOSIS — M4806 Spinal stenosis, lumbar region: Secondary | ICD-10-CM

## 2015-11-12 DIAGNOSIS — Z0000 Encounter for general adult medical examination without abnormal findings: Secondary | ICD-10-CM | POA: Insufficient documentation

## 2015-11-12 DIAGNOSIS — B379 Candidiasis, unspecified: Secondary | ICD-10-CM

## 2015-11-12 DIAGNOSIS — Z23 Encounter for immunization: Secondary | ICD-10-CM

## 2015-11-12 DIAGNOSIS — G8929 Other chronic pain: Secondary | ICD-10-CM

## 2015-11-12 DIAGNOSIS — E039 Hypothyroidism, unspecified: Secondary | ICD-10-CM

## 2015-11-12 DIAGNOSIS — B372 Candidiasis of skin and nail: Secondary | ICD-10-CM | POA: Diagnosis not present

## 2015-11-12 DIAGNOSIS — Z7689 Persons encountering health services in other specified circumstances: Secondary | ICD-10-CM

## 2015-11-12 DIAGNOSIS — R059 Cough, unspecified: Secondary | ICD-10-CM | POA: Insufficient documentation

## 2015-11-12 LAB — HEPATITIS C ANTIBODY: HCV Ab: NEGATIVE

## 2015-11-12 MED ORDER — CLOTRIMAZOLE 1 % EX CREA: 1.0000 "application " | TOPICAL_CREAM | Freq: Two times a day (BID) | CUTANEOUS | 1 refills | Status: DC

## 2015-11-12 NOTE — Assessment & Plan Note (Signed)
Patient is up-to-date on colonoscopy. We discussed  Pap smear and she would like to continue with the Pap smear. We will do this at her next physical which patient usually does in October. Her last bone mineral density showed osteopenia (although I was unable to access these records). I advised that I would like to repeat a DEXA scan. Patient politely declined today and that she would discuss this at follow-up.

## 2015-11-12 NOTE — Assessment & Plan Note (Signed)
Chronic at baseline.Patient I agreed on return to start physical therapy for further intervention. Place referral for patient.

## 2015-11-12 NOTE — Assessment & Plan Note (Signed)
No rash today. I suspect candidiasis as it is responding to over-the-counter antifungal. Gave patient prescription for clomitrazole if rash recurs.

## 2015-11-12 NOTE — Patient Instructions (Signed)
Pleasure meeting you. See you again at your physical with pap smear.

## 2015-11-12 NOTE — Progress Notes (Signed)
Subjective:    Patient ID: Alexandra Bishop, female    DOB: 1946-03-03, 70 y.o.   MRN: [id number]  CC: Alexandra Bishop is a 70 y.o. female who presents today for follow up.   HPI:   Here for  follow-up and to establish care.   Thyroid- No palpitations. Has lost 20 pounds on Paleo diet.   Lumbar stenosis- Chronic, on gabapentin with some relief. Continues to have pain with walking, which limits ability to exercise. Has been to [facility] in the past and would like to go back.   She also complains of productive cough for past couple of weeks since trip to [place]. Waxing and waning.No SOB, chest pain, sinus pressure, ear pain. No fever, chills, wheezing. Has tried cough drops with relief.  She also complains of occasional rash under breasts during summer. Rash resolved at this time. Responds to OTC antifungal powder.   Mammogram November 2016 normal.  Bone mineral density showed osteopenia 03/2014. We will do DEXA scan at next visit. Advised to repeat.  Up-to-date on colonoscopy, last done 04/2011 and showed polpys ( negative for malignancy) diverticulosis and hemorrhoids.   Pap with CPE 12/2015.        HISTORY:  Past Medical History:  Diagnosis Date  . Hypothyroidism    Past Surgical History:  Procedure Laterality Date  . broken ankle - RT    . BUNIONECTOMY  2015   Family History  Problem Relation Age of Onset  . Cancer Mother     sarcoma  . Heart disease Brother   . Cancer Maternal Grandmother     breast  . Breast cancer Maternal Grandmother   . Cancer Paternal Grandmother     breast  . Cancer      family hx  . Thyroid disease      family hx  . Thyroid cancer Neg Hx     Allergies: Cymbalta [duloxetine hcl]; Hydrocodone; and Tramadol Current Outpatient Prescriptions on File Prior to Visit  Medication Sig Dispense Refill  . amLODipine (NORVASC) 10 MG tablet TAKE 1 TABLET BY MOUTH EVERY DAY 90 tablet 3  . clobetasol (TEMOVATE) 0.05 % external solution APPLY TO  AFFECTED AREA ON SCALP AT BEDTIME  1  . gabapentin (NEURONTIN) 100 MG capsule TAKE 1 CAPSULE (100 MG TOTAL) BY MOUTH 4 (FOUR) TIMES DAILY. 360 capsule 1  . levothyroxine (SYNTHROID, LEVOTHROID) 100 MCG tablet Take 1 tablet (100 mcg total) by mouth daily. 90 tablet 1   No current facility-administered medications on file prior to visit.     Social History  Substance Use Topics  . Smoking status: Never Smoker  . Smokeless tobacco: Never Used     Comment: tobacco use - no   . Alcohol use No    Review of Systems  Constitutional: Negative for chills, fever and unexpected weight change.  HENT: Negative for congestion, postnasal drip, rhinorrhea, sinus pressure and sneezing.   Respiratory: Positive for cough. Negative for shortness of breath and wheezing.   Cardiovascular: Negative for chest pain, palpitations and leg swelling.  Gastrointestinal: Negative for nausea and vomiting.  Endocrine: Negative for cold intolerance and heat intolerance.  Musculoskeletal: Positive for back pain. Negative for arthralgias and myalgias.  Skin: Negative for rash.  Neurological: Negative for headaches.  Hematological: Negative for adenopathy.  Psychiatric/Behavioral: Negative for confusion.      Objective:    BP 116/76 (BP Location: Left Arm, Patient Position: Sitting, Cuff Size: Large)   Pulse 60   Temp 98 F (  36.7 C) (Oral)   Resp 16   Ht 5\' 5"  (1.651 m)   Wt 188 lb (85.3 kg)   SpO2 95%   BMI 31.28 kg/m  BP Readings from Last 3 Encounters:  11/12/15 116/76  06/12/15 94/69  04/23/15 101/71   Wt Readings from Last 3 Encounters:  11/12/15 188 lb (85.3 kg)  06/12/15 208 lb 6 oz (94.5 kg)  04/23/15 205 lb 4 oz (93.1 kg)    Physical Exam  Constitutional: She appears well-developed and well-nourished.  HENT:  Head: Normocephalic and atraumatic.  Right Ear: Hearing, tympanic membrane, external ear and ear canal normal. No drainage, swelling or tenderness. No foreign bodies. Tympanic  membrane is not erythematous and not bulging. No middle ear effusion. No decreased hearing is noted.  Left Ear: Hearing, tympanic membrane, external ear and ear canal normal. No drainage, swelling or tenderness. No foreign bodies. Tympanic membrane is not erythematous and not bulging.  No middle ear effusion. No decreased hearing is noted.  Nose: Nose normal. No rhinorrhea. Right sinus exhibits no maxillary sinus tenderness and no frontal sinus tenderness. Left sinus exhibits no maxillary sinus tenderness and no frontal sinus tenderness.  Mouth/Throat: Uvula is midline, oropharynx is clear and moist and mucous membranes are normal. No oropharyngeal exudate, posterior oropharyngeal edema, posterior oropharyngeal erythema or tonsillar abscesses.  Eyes: Conjunctivae are normal.  Neck: No thyroid mass and no thyromegaly present.  Cardiovascular: Regular rhythm, normal heart sounds and normal pulses.   Pulmonary/Chest: Effort normal and breath sounds normal. She has no wheezes. She has no rhonchi. She has no rales.  No rash under breasts.  Lymphadenopathy:       Head (right side): No submental, no submandibular, no tonsillar, no preauricular, no posterior auricular and no occipital adenopathy present.       Head (left side): No submental, no submandibular, no tonsillar, no preauricular, no posterior auricular and no occipital adenopathy present.    She has no cervical adenopathy.  Neurological: She is alert.  Skin: Skin is warm and dry.  Psychiatric: She has a normal mood and affect. Her speech is normal and behavior is normal. Thought content normal.  Vitals reviewed.      Assessment & Plan:   Problem List Items Addressed This Visit      Endocrine   Hypothyroidism    No symptoms. Pending TSH.      Relevant Orders   TSH     Musculoskeletal and Integument   Candidal intertrigo    No rash today. I suspect candidiasis as it is responding to over-the-counter antifungal. Gave patient  prescription for clomitrazole if rash recurs.      Relevant Medications   clotrimazole (LOTRIMIN) 1 % cream     Other   Chronic low back pain    Chronic at baseline.Patient I agreed on return to start physical therapy for further intervention. Place referral for patient.      Encounter to establish care - Primary    Patient is up-to-date on colonoscopy. We discussed  Pap smear and she would like to continue with the Pap smear. We will do this at her next physical which patient usually does in October. Her last bone mineral density showed osteopenia (although I was unable to access these records). I advised that I would like to repeat a DEXA scan. Patient politely declined today and that she would discuss this at follow-up.      Relevant Orders   Hepatitis C antibody   Ambulatory referral to  Dermatology    Other Visit Diagnoses    Spinal stenosis of lumbar region       Relevant Orders   Ambulatory referral to Physical Therapy   Encounter for screening mammogram for breast cancer       Relevant Orders   MM DIGITAL SCREENING BILATERAL   Candida infection       Relevant Medications   clotrimazole (LOTRIMIN) 1 % cream   Need for prophylactic vaccination and inoculation against influenza       Relevant Orders   Flu Vaccine QUAD 36+ mos IM       I am having Ms. Sittner start on clotrimazole. I am also having her maintain her clobetasol, amLODipine, levothyroxine, and gabapentin.   Meds ordered this encounter  Medications  . clotrimazole (LOTRIMIN) 1 % cream    Sig: Apply 1 application topically 2 (two) times daily.    Dispense:  30 g    Refill:  1    Order Specific Question:   Supervising Provider    Answer:   Crecencio Mc [[id number]]    Return precautions given.   Risks, benefits, and alternatives of the medications and treatment plan prescribed today were discussed, and patient expressed understanding.   Education regarding symptom management and diagnosis given to  patient on AVS.  Continue to follow with Ronette Deter, MD (Inactive) for routine health maintenance.   Zigmund Gottron and I agreed with plan.   Mable Paris, FNP

## 2015-11-12 NOTE — Assessment & Plan Note (Signed)
No adventitious lung sounds. Afebrile. Advised patient to follow conservative therapy at home and let me know if cough does not resolve on its own.

## 2015-11-12 NOTE — Assessment & Plan Note (Signed)
No symptoms. Pending TSH.

## 2015-11-13 ENCOUNTER — Telehealth: Payer: Self-pay

## 2015-11-13 LAB — TSH: TSH: 1.46 u[IU]/mL (ref 0.35–4.50)

## 2015-11-13 NOTE — Telephone Encounter (Signed)
Patient advised as below.  

## 2015-11-13 NOTE — Telephone Encounter (Signed)
-----   Message from Burnard Hawthorne, [place] sent at 11/13/2015 12:16 PM EDT ----- Please let patient know that recent tests were normal based on their preference- mail, etc.  Normal tsh. Neg hep c  If unable to reach patient after 3 attempts, please let me know.   NOTE: If MyChart activated, I have also released them to MyChart.   Thanks  :)

## 2015-11-20 ENCOUNTER — Encounter: Payer: Self-pay | Admitting: Family

## 2015-11-27 ENCOUNTER — Encounter: Payer: Medicare Other | Admitting: Family Medicine

## 2015-11-28 DIAGNOSIS — L298 Other pruritus: Secondary | ICD-10-CM | POA: Diagnosis not present

## 2015-12-08 DIAGNOSIS — N760 Acute vaginitis: Secondary | ICD-10-CM | POA: Diagnosis not present

## 2015-12-09 DIAGNOSIS — M545 Low back pain: Secondary | ICD-10-CM | POA: Diagnosis not present

## 2015-12-11 DIAGNOSIS — Z124 Encounter for screening for malignant neoplasm of cervix: Secondary | ICD-10-CM | POA: Diagnosis not present

## 2015-12-11 DIAGNOSIS — Z1151 Encounter for screening for human papillomavirus (HPV): Secondary | ICD-10-CM | POA: Diagnosis not present

## 2015-12-11 DIAGNOSIS — N760 Acute vaginitis: Secondary | ICD-10-CM | POA: Diagnosis not present

## 2015-12-11 DIAGNOSIS — Z01411 Encounter for gynecological examination (general) (routine) with abnormal findings: Secondary | ICD-10-CM | POA: Diagnosis not present

## 2015-12-12 DIAGNOSIS — M545 Low back pain: Secondary | ICD-10-CM | POA: Diagnosis not present

## 2015-12-19 DIAGNOSIS — M545 Low back pain: Secondary | ICD-10-CM | POA: Diagnosis not present

## 2015-12-22 DIAGNOSIS — M545 Low back pain: Secondary | ICD-10-CM | POA: Diagnosis not present

## 2015-12-23 DIAGNOSIS — L738 Other specified follicular disorders: Secondary | ICD-10-CM | POA: Diagnosis not present

## 2015-12-23 DIAGNOSIS — L400 Psoriasis vulgaris: Secondary | ICD-10-CM | POA: Diagnosis not present

## 2015-12-23 DIAGNOSIS — L821 Other seborrheic keratosis: Secondary | ICD-10-CM | POA: Diagnosis not present

## 2015-12-24 ENCOUNTER — Ambulatory Visit (INDEPENDENT_AMBULATORY_CARE_PROVIDER_SITE_OTHER): Payer: Medicare Other | Admitting: Family

## 2015-12-24 ENCOUNTER — Encounter: Payer: Self-pay | Admitting: Family

## 2015-12-24 VITALS — BP 122/68 | HR 60 | Temp 98.2°F | Ht 65.0 in | Wt 187.7 lb

## 2015-12-24 DIAGNOSIS — Z0000 Encounter for general adult medical examination without abnormal findings: Secondary | ICD-10-CM | POA: Diagnosis not present

## 2015-12-24 DIAGNOSIS — R252 Cramp and spasm: Secondary | ICD-10-CM | POA: Diagnosis not present

## 2015-12-24 LAB — BASIC METABOLIC PANEL
BUN: 17 mg/dL (ref 6–23)
CO2: 29 mEq/L (ref 19–32)
Calcium: 9.4 mg/dL (ref 8.4–10.5)
Chloride: 103 mEq/L (ref 96–112)
Creatinine, Ser: 0.78 mg/dL (ref 0.40–1.20)
GFR: 77.08 mL/min (ref >60.00)
Glucose, Bld: 85 mg/dL (ref 70–99)
Potassium: 4.3 mEq/L (ref 3.5–5.1)
Sodium: 140 mEq/L (ref 135–145)

## 2015-12-24 NOTE — Assessment & Plan Note (Addendum)
UTD colonoscopy. Due for mammogram and scheduled. Patient is up-to-date on Pap smear which she had done 2 weeks ago with OB/GYN. And I'm unable to see those reports however patient states was normal. I asked her to bring the report at next visit. She is due for DEXA 03/2016 and that has been ordered. Education provided on appropriate amount of calcium and vitamin D to take. She is up-to-date on immunizations. Screening labs today. Patient has been taking over-the-counter potassium supplements every other day since she read that paleo diet causes low potassium. She  endorse leg cramps. I advised her to stop taking the potassium supplements and to use quinine for leg cramps. Rechecking potassium today. Patient will also come back for fasting blood work next week and we will recheck potassium then as well.

## 2015-12-24 NOTE — Progress Notes (Signed)
Subjective:    Patient ID: Alexandra Bishop, female    DOB: Apr 08, 1945, 70 y.o.   MRN: [id number]  CC: Alexandra Bishop is a 70 y.o. female who presents today for physical exam.    HPI: Patient here for CPE.  Recently started having LE cramps while on paleo, worse at night.  Taking potassium every other day. No chest pain, palpitations.       Colorectal Cancer Screening: UTD , last 2013. Breast Cancer Screening: Mammogram due Cervical Cancer Screening: Due. No h/o abnormal. Done 12/11/15. Normal per patient; I do not have report. Requested from patient. Bone Health screening/DEXA for 65+: 03/2014 showed osteopenia. Will repeat. Taking calcium and vitamin D once per week.   Immunizations       Tetanus - UTD        Pneumococcal - Done. Labs: Screening labs today. Exercise: Gets regular exercise.  Alcohol use: None Smoking/tobacco use: Nonsmoker.  Regular dental exams: UTD Wears seat belt: Yes.  HISTORY:  Past Medical History:  Diagnosis Date  . Hypothyroidism     Past Surgical History:  Procedure Laterality Date  . broken ankle - RT    . BUNIONECTOMY  2015   Family History  Problem Relation Age of Onset  . Cancer Mother     sarcoma  . Heart disease Brother   . Cancer Maternal Grandmother     breast  . Breast cancer Maternal Grandmother   . Cancer Paternal Grandmother     breast  . Cancer      family hx  . Thyroid disease      family hx  . Thyroid cancer Neg Hx       ALLERGIES: Cymbalta [duloxetine hcl]; Hydrocodone; Shellfish allergy; and Tramadol  Current Outpatient Prescriptions on File Prior to Visit  Medication Sig Dispense Refill  . amLODipine (NORVASC) 10 MG tablet TAKE 1 TABLET BY MOUTH EVERY DAY 90 tablet 3  . clobetasol (TEMOVATE) 0.05 % external solution APPLY TO AFFECTED AREA ON SCALP AT BEDTIME  1  . gabapentin (NEURONTIN) 100 MG capsule TAKE 1 CAPSULE (100 MG TOTAL) BY MOUTH 4 (FOUR) TIMES DAILY. 360 capsule 1  . levothyroxine (SYNTHROID,  LEVOTHROID) 100 MCG tablet Take 1 tablet (100 mcg total) by mouth daily. 90 tablet 1   No current facility-administered medications on file prior to visit.     Social History  Substance Use Topics  . Smoking status: Never Smoker  . Smokeless tobacco: Never Used     Comment: tobacco use - no   . Alcohol use No    Review of Systems  Constitutional: Negative for chills, fever and unexpected weight change.  HENT: Negative for congestion.   Respiratory: Negative for cough.   Cardiovascular: Negative for chest pain, palpitations and leg swelling.  Gastrointestinal: Negative for nausea and vomiting.  Musculoskeletal: Positive for myalgias. Negative for arthralgias.  Skin: Negative for rash.  Neurological: Negative for headaches.  Hematological: Negative for adenopathy.  Psychiatric/Behavioral: Negative for confusion.      Objective:    BP 122/68   Pulse 60   Temp 98.2 F (36.8 C) (Oral)   Ht 5\' 5"  (1.651 m)   Wt 187 lb 11.2 oz (85.1 kg)   SpO2 98%   BMI 31.23 kg/m   BP Readings from Last 3 Encounters:  12/24/15 122/68  11/12/15 116/76  06/12/15 94/69   Wt Readings from Last 3 Encounters:  12/24/15 187 lb 11.2 oz (85.1 kg)  11/12/15 188 lb (85.3 kg)  06/12/15  208 lb 6 oz (94.5 kg)    Physical Exam  Constitutional: She appears well-developed and well-nourished.  Eyes: Conjunctivae are normal.  Neck: No thyroid mass and no thyromegaly present.  Cardiovascular: Normal rate, regular rhythm, normal heart sounds and normal pulses.   Pulmonary/Chest: Effort normal and breath sounds normal. She has no wheezes. She has no rhonchi. She has no rales. Right breast exhibits no inverted nipple, no mass, no nipple discharge, no skin change and no tenderness. Left breast exhibits no inverted nipple, no mass, no nipple discharge, no skin change and no tenderness. Breasts are symmetrical.  CBE performed.   Lymphadenopathy:       Head (right side): No submental, no submandibular, no  tonsillar, no preauricular, no posterior auricular and no occipital adenopathy present.       Head (left side): No submental, no submandibular, no tonsillar, no preauricular, no posterior auricular and no occipital adenopathy present.    She has no cervical adenopathy.       Right cervical: No superficial cervical, no deep cervical and no posterior cervical adenopathy present.      Left cervical: No superficial cervical, no deep cervical and no posterior cervical adenopathy present.    She has no axillary adenopathy.  Neurological: She is alert.  Skin: Skin is warm and dry.  Psychiatric: She has a normal mood and affect. Her speech is normal and behavior is normal. Thought content normal.  Vitals reviewed.      Assessment & Plan:   Problem List Items Addressed This Visit      Other   Routine physical examination - Primary    UTD colonoscopy. Due for mammogram and scheduled. Patient is up-to-date on Pap smear which she had done 2 weeks ago with OB/GYN. And I'm unable to see those reports however patient states was normal. I asked her to bring the report at next visit. She is due for DEXA 03/2016 and that has been ordered. Education provided on appropriate amount of calcium and vitamin D to take. She is up-to-date on immunizations. Screening labs today. Patient has been taking over-the-counter potassium supplements every other day since she read that paleo diet causes low potassium. She  endorse leg cramps. I advised her to stop taking the potassium supplements and to use quinine for leg cramps. Rechecking potassium today. Patient will also come back for fasting blood work next week and we will recheck potassium then as well.      Relevant Orders   MM DIGITAL SCREENING BILATERAL   CBC with Differential/Platelet   Comprehensive metabolic panel   Hemoglobin A1c   Lipid panel   VITAMIN D 25 Hydroxy (Vit-D Deficiency, Fractures)   Basic metabolic panel   DG Bone Density    Other Visit  Diagnoses   None.      I have discontinued Ms. Dupriest's clotrimazole. I am also having her maintain her clobetasol, amLODipine, levothyroxine, and gabapentin.   No orders of the defined types were placed in this encounter.   Return precautions given.   Risks, benefits, and alternatives of the medications and treatment plan prescribed today were discussed, and patient expressed understanding.   Education regarding symptom management and diagnosis given to patient on AVS.   Continue to follow with Mable Paris, FNP for routine health maintenance.   Zigmund Gottron and I agreed with plan.   Mable Paris, FNP

## 2015-12-24 NOTE — Patient Instructions (Addendum)
Will check potassium today.  Pleasure seeing you.   Please stop taking potassium supplements and use quinine for leg cramps. If these continues , please make an office visit with me.  Please come back in one week for fasting blood work and you can make appt with lab.  Dexa due 03/2016. I have already ordered.   For post menopausal women, guidelines recommend a diet with 1200 mg of Calcium per day. If you are eating calcium rich foods, you do not need a calcium supplement. The body better absorbs the calcium that you eat over supplementation. If you do supplement, I recommend not supplementing the full 1200 mg/ day as this can lead to increased risk of cardiovascular disease. I recommend Calcium Citrate over the counter, and you may take a total of 600 to 800 mg per day in divided doses with meals for best absorption.   For bone health, you need adequate vitamin D, and I recommend you supplement as it is harder to do so with diet alone. I recommend cholecalciferol 800 units daily.  Also, please ensure you are following a diet high in calcium -- research shows better outcomes with dietary sources including kale, yogurt, broccolii, cheese, okra, almonds- to name a few.     Also remember that exercise is a great medicine for maintain and preserve bone health. Advise moderate exercise for 30 minutes , 3 times per week.    Health Maintenance, Female Adopting a healthy lifestyle and getting preventive care can go a long way to promote health and wellness. Talk with your health care provider about what schedule of regular examinations is right for you. This is a good chance for you to check in with your provider about disease prevention and staying healthy. In between checkups, there are plenty of things you can do on your own. Experts have done a lot of research about which lifestyle changes and preventive measures are most likely to keep you healthy. Ask your health care provider for more  information. WEIGHT AND DIET  Eat a healthy diet  Be sure to include plenty of vegetables, fruits, low-fat dairy products, and lean protein.  Do not eat a lot of foods high in solid fats, added sugars, or salt.  Get regular exercise. This is one of the most important things you can do for your health.  Most adults should exercise for at least 150 minutes each week. The exercise should increase your heart rate and make you sweat (moderate-intensity exercise).  Most adults should also do strengthening exercises at least twice a week. This is in addition to the moderate-intensity exercise.  Maintain a healthy weight  Body mass index (BMI) is a measurement that can be used to identify possible weight problems. It estimates body fat based on height and weight. Your health care provider can help determine your BMI and help you achieve or maintain a healthy weight.  For females 20 years of age and older:   A BMI below 18.5 is considered underweight.  A BMI of 18.5 to 24.9 is normal.  A BMI of 25 to 29.9 is considered overweight.  A BMI of 30 and above is considered obese.  Watch levels of cholesterol and blood lipids  You should start having your blood tested for lipids and cholesterol at 72 years of age, then have this test every 5 years.  You may need to have your cholesterol levels checked more often if:  Your lipid or cholesterol levels are high.  You are   older than 72 years of age.  You are at high risk for heart disease.  CANCER SCREENING   Lung Cancer  Lung cancer screening is recommended for adults 55-80 years old who are at high risk for lung cancer because of a history of smoking.  A yearly low-dose CT scan of the lungs is recommended for people who:  Currently smoke.  Have quit within the past 15 years.  Have at least a 30-pack-year history of smoking. A pack year is smoking an average of one pack of cigarettes a day for 1 year.  Yearly screening should  continue until it has been 15 years since you quit.  Yearly screening should stop if you develop a health problem that would prevent you from having lung cancer treatment.  Breast Cancer  Practice breast self-awareness. This means understanding how your breasts normally appear and feel.  It also means doing regular breast self-exams. Let your health care provider know about any changes, no matter how small.  If you are in your 20s or 30s, you should have a clinical breast exam (CBE) by a health care provider every 1-3 years as part of a regular health exam.  If you are 40 or older, have a CBE every year. Also consider having a breast X-ray (mammogram) every year.  If you have a family history of breast cancer, talk to your health care provider about genetic screening.  If you are at high risk for breast cancer, talk to your health care provider about having an MRI and a mammogram every year.  Breast cancer gene (BRCA) assessment is recommended for women who have family members with BRCA-related cancers. BRCA-related cancers include:  Breast.  Ovarian.  Tubal.  Peritoneal cancers.  Results of the assessment will determine the need for genetic counseling and BRCA1 and BRCA2 testing. Cervical Cancer Your health care provider may recommend that you be screened regularly for cancer of the pelvic organs (ovaries, uterus, and vagina). This screening involves a pelvic examination, including checking for microscopic changes to the surface of your cervix (Pap test). You may be encouraged to have this screening done every 3 years, beginning at age 21.  For women ages 30-65, health care providers may recommend pelvic exams and Pap testing every 3 years, or they may recommend the Pap and pelvic exam, combined with testing for human papilloma virus (HPV), every 5 years. Some types of HPV increase your risk of cervical cancer. Testing for HPV may also be done on women of any age with unclear Pap  test results.  Other health care providers may not recommend any screening for nonpregnant women who are considered low risk for pelvic cancer and who do not have symptoms. Ask your health care provider if a screening pelvic exam is right for you.  If you have had past treatment for cervical cancer or a condition that could lead to cancer, you need Pap tests and screening for cancer for at least 20 years after your treatment. If Pap tests have been discontinued, your risk factors (such as having a new sexual partner) need to be reassessed to determine if screening should resume. Some women have medical problems that increase the chance of getting cervical cancer. In these cases, your health care provider may recommend more frequent screening and Pap tests. Colorectal Cancer  This type of cancer can be detected and often prevented.  Routine colorectal cancer screening usually begins at 72 years of age and continues through 72 years of age.    Your health care provider may recommend screening at an earlier age if you have risk factors for colon cancer.  Your health care provider may also recommend using home test kits to check for hidden blood in the stool.  A small camera at the end of a tube can be used to examine your colon directly (sigmoidoscopy or colonoscopy). This is done to check for the earliest forms of colorectal cancer.  Routine screening usually begins at age 89.  Direct examination of the colon should be repeated every 5-10 years through 72 years of age. However, you may need to be screened more often if early forms of precancerous polyps or small growths are found. Skin Cancer  Check your skin from head to toe regularly.  Tell your health care provider about any new moles or changes in moles, especially if there is a change in a mole's shape or color.  Also tell your health care provider if you have a mole that is larger than the size of a pencil eraser.  Always use sunscreen.  Apply sunscreen liberally and repeatedly throughout the day.  Protect yourself by wearing long sleeves, pants, a wide-brimmed hat, and sunglasses whenever you are outside. HEART DISEASE, DIABETES, AND HIGH BLOOD PRESSURE   High blood pressure causes heart disease and increases the risk of stroke. High blood pressure is more likely to develop in:  People who have blood pressure in the high end of the normal range (130-139/85-89 mm Hg).  People who are overweight or obese.  People who are African American.  If you are 88-37 years of age, have your blood pressure checked every 3-5 years. If you are 80 years of age or older, have your blood pressure checked every year. You should have your blood pressure measured twice--once when you are at a hospital or clinic, and once when you are not at a hospital or clinic. Record the average of the two measurements. To check your blood pressure when you are not at a hospital or clinic, you can use:  An automated blood pressure machine at a pharmacy.  A home blood pressure monitor.  If you are between 42 years and 62 years old, ask your health care provider if you should take aspirin to prevent strokes.  Have regular diabetes screenings. This involves taking a blood sample to check your fasting blood sugar level.  If you are at a normal weight and have a low risk for diabetes, have this test once every three years after 72 years of age.  If you are overweight and have a high risk for diabetes, consider being tested at a younger age or more often. PREVENTING INFECTION  Hepatitis B  If you have a higher risk for hepatitis B, you should be screened for this virus. You are considered at high risk for hepatitis B if:  You were born in a country where hepatitis B is common. Ask your health care provider which countries are considered high risk.  Your parents were born in a high-risk country, and you have not been immunized against hepatitis B (hepatitis B  vaccine).  You have HIV or AIDS.  You use needles to inject street drugs.  You live with someone who has hepatitis B.  You have had sex with someone who has hepatitis B.  You get hemodialysis treatment.  You take certain medicines for conditions, including cancer, organ transplantation, and autoimmune conditions. Hepatitis C  Blood testing is recommended for:  Everyone born from [birth year] through 1967.  Anyone with known risk factors for hepatitis C. Sexually transmitted infections (STIs)  You should be screened for sexually transmitted infections (STIs) including gonorrhea and chlamydia if:  You are sexually active and are younger than 72 years of age.  You are older than 72 years of age and your health care provider tells you that you are at risk for this type of infection.  Your sexual activity has changed since you were last screened and you are at an increased risk for chlamydia or gonorrhea. Ask your health care provider if you are at risk.  If you do not have HIV, but are at risk, it may be recommended that you take a prescription medicine daily to prevent HIV infection. This is called pre-exposure prophylaxis (PrEP). You are considered at risk if:  You are sexually active and do not regularly use condoms or know the HIV status of your partner(s).  You take drugs by injection.  You are sexually active with a partner who has HIV. Talk with your health care provider about whether you are at high risk of being infected with HIV. If you choose to begin PrEP, you should first be tested for HIV. You should then be tested every 3 months for as long as you are taking PrEP.  PREGNANCY   If you are premenopausal and you may become pregnant, ask your health care provider about preconception counseling.  If you may become pregnant, take 400 to 800 micrograms (mcg) of folic acid every day.  If you want to prevent pregnancy, talk to your health care provider about birth control  (contraception). OSTEOPOROSIS AND MENOPAUSE   Osteoporosis is a disease in which the bones lose minerals and strength with aging. This can result in serious bone fractures. Your risk for osteoporosis can be identified using a bone density scan.  If you are 14 years of age or older, or if you are at risk for osteoporosis and fractures, ask your health care provider if you should be screened.  Ask your health care provider whether you should take a calcium or vitamin D supplement to lower your risk for osteoporosis.  Menopause may have certain physical symptoms and risks.  Hormone replacement therapy may reduce some of these symptoms and risks. Talk to your health care provider about whether hormone replacement therapy is right for you.  HOME CARE INSTRUCTIONS   Schedule regular health, dental, and eye exams.  Stay current with your immunizations.   Do not use any tobacco products including cigarettes, chewing tobacco, or electronic cigarettes.  If you are pregnant, do not drink alcohol.  If you are breastfeeding, limit how much and how often you drink alcohol.  Limit alcohol intake to no more than 1 drink per day for nonpregnant women. One drink equals 12 ounces of beer, 5 ounces of wine, or 1 ounces of hard liquor.  Do not use street drugs.  Do not share needles.  Ask your health care provider for help if you need support or information about quitting drugs.  Tell your health care provider if you often feel depressed.  Tell your health care provider if you have ever been abused or do not feel safe at home.   This information is not intended to replace advice given to you by your health care provider. Make sure you discuss any questions you have with your health care provider.   Document Released: 09/21/2010 Document Revised: 03/29/2014 Document Reviewed: 02/07/2013 Elsevier Interactive Patient Education [employer].

## 2015-12-24 NOTE — Progress Notes (Signed)
Pre visit review using our clinic review tool, if applicable. No additional management support is needed unless otherwise documented below in the visit note. 

## 2015-12-25 DIAGNOSIS — N762 Acute vulvitis: Secondary | ICD-10-CM | POA: Diagnosis not present

## 2015-12-25 DIAGNOSIS — L259 Unspecified contact dermatitis, unspecified cause: Secondary | ICD-10-CM | POA: Diagnosis not present

## 2015-12-26 DIAGNOSIS — M545 Low back pain: Secondary | ICD-10-CM | POA: Diagnosis not present

## 2015-12-31 ENCOUNTER — Other Ambulatory Visit (INDEPENDENT_AMBULATORY_CARE_PROVIDER_SITE_OTHER): Payer: Medicare Other

## 2015-12-31 DIAGNOSIS — Z0000 Encounter for general adult medical examination without abnormal findings: Secondary | ICD-10-CM | POA: Diagnosis not present

## 2015-12-31 LAB — CBC WITH DIFFERENTIAL/PLATELET
Basophils Absolute: 0 10*3/uL (ref 0.0–0.1)
Basophils Relative: 0.6 % (ref 0.0–3.0)
Eosinophils Absolute: 0.3 10*3/uL (ref 0.0–0.7)
Eosinophils Relative: 4.2 % (ref 0.0–5.0)
HCT: 46.6 % — ABNORMAL HIGH (ref 36.0–46.0)
Hemoglobin: 15.5 g/dL — ABNORMAL HIGH (ref 12.0–15.0)
Lymphocytes Relative: 18.9 % (ref 12.0–46.0)
Lymphs Abs: 1.3 10*3/uL (ref 0.7–4.0)
MCHC: 33.2 g/dL (ref 30.0–36.0)
MCV: 83.4 fl (ref 78.0–100.0)
Monocytes Absolute: 0.5 10*3/uL (ref 0.1–1.0)
Monocytes Relative: 7 % (ref 3.0–12.0)
Neutro Abs: 4.6 10*3/uL (ref 1.4–7.7)
Neutrophils Relative %: 69.3 % (ref 43.0–77.0)
Platelets: 238 10*3/uL (ref 150.0–400.0)
RBC: 5.59 Mil/uL — ABNORMAL HIGH (ref 3.87–5.11)
RDW: 13.7 % (ref 11.5–15.5)
WBC: 6.7 10*3/uL (ref 4.0–10.5)

## 2015-12-31 LAB — COMPREHENSIVE METABOLIC PANEL
ALT: 20 U/L (ref 0–35)
AST: 24 U/L (ref 0–37)
Albumin: 3.9 g/dL (ref 3.5–5.2)
Alkaline Phosphatase: 80 U/L (ref 39–117)
BUN: 13 mg/dL (ref 6–23)
CO2: 26 mEq/L (ref 19–32)
Calcium: 9.1 mg/dL (ref 8.4–10.5)
Chloride: 107 mEq/L (ref 96–112)
Creatinine, Ser: 0.86 mg/dL (ref 0.40–1.20)
GFR: 68.86 mL/min (ref >60.00)
Glucose, Bld: 99 mg/dL (ref 70–99)
Potassium: 4 mEq/L (ref 3.5–5.1)
Sodium: 141 mEq/L (ref 135–145)
Total Bilirubin: 0.6 mg/dL (ref 0.2–1.2)
Total Protein: 6.5 g/dL (ref 6.0–8.3)

## 2015-12-31 LAB — LIPID PANEL
Cholesterol: 172 mg/dL (ref 0–200)
HDL: 52.8 mg/dL (ref >39.00)
LDL Cholesterol: 93 mg/dL (ref 0–99)
NonHDL: 119.04
Total CHOL/HDL Ratio: 3
Triglycerides: 129 mg/dL (ref 0.0–149.0)
VLDL: 25.8 mg/dL (ref 0.0–40.0)

## 2015-12-31 LAB — HEMOGLOBIN A1C: Hgb A1c MFr Bld: 5.6 % (ref 4.6–6.5)

## 2015-12-31 LAB — VITAMIN D 25 HYDROXY (VIT D DEFICIENCY, FRACTURES): VITD: 29.57 ng/mL — ABNORMAL LOW (ref 30.00–100.00)

## 2016-01-01 ENCOUNTER — Telehealth: Payer: Self-pay | Admitting: Family

## 2016-01-01 DIAGNOSIS — L237 Allergic contact dermatitis due to plants, except food: Secondary | ICD-10-CM | POA: Diagnosis not present

## 2016-01-01 NOTE — Telephone Encounter (Signed)
Patient was informed labs have not been interpreted yet.

## 2016-01-01 NOTE — Telephone Encounter (Signed)
Pt was here to get her lab results. Please call her at [phone] with her lab results.

## 2016-01-02 ENCOUNTER — Encounter: Payer: Self-pay | Admitting: *Deleted

## 2016-01-02 DIAGNOSIS — M545 Low back pain: Secondary | ICD-10-CM | POA: Diagnosis not present

## 2016-01-05 DIAGNOSIS — M545 Low back pain: Secondary | ICD-10-CM | POA: Diagnosis not present

## 2016-01-08 ENCOUNTER — Ambulatory Visit (INDEPENDENT_AMBULATORY_CARE_PROVIDER_SITE_OTHER): Payer: Medicare Other

## 2016-01-08 VITALS — BP 120/64 | HR 60 | Temp 98.2°F | Resp 14 | Ht 65.0 in | Wt 189.0 lb

## 2016-01-08 DIAGNOSIS — Z0000 Encounter for general adult medical examination without abnormal findings: Secondary | ICD-10-CM

## 2016-01-08 NOTE — Patient Instructions (Addendum)
  Alexandra Bishop , Thank you for taking time to come for your Medicare Wellness Visit. I appreciate your ongoing commitment to your health goals. Please review the following plan we discussed and let me know if I can assist you in the future.   FOLLOW UP WITH MARGARET ARNETT, FNP,  AS NEEDED.  These are the goals we discussed: Goals    . Healthy Lifestyle          Stay hydrated and drink plenty of water. Stay active and continue exercising. Low carb foods.  Educational material provided.     . Increase water intake          Incorporate more water into diet.  Up to 4 glasses per day.    . Low Carb Foods          Eat less desert.            This is a list of the screening recommended for you and due dates:  Health Maintenance  Topic Date Due  . Mammogram  01/21/2017  . Tetanus Vaccine  01/11/2018  . Colon Cancer Screening  05/10/2021  . Flu Shot  Completed  . DEXA scan (bone density measurement)  Completed  . Shingles Vaccine  Completed  .  Hepatitis C: One time screening is recommended by Center for Disease Control  (CDC) for  adults born from [birth year] through 1967.   Completed

## 2016-01-08 NOTE — Progress Notes (Addendum)
Subjective:   Alexandra Bishop is a 70 y.o. female who presents for Medicare Annual (Subsequent) preventive examination.  Review of Systems:  No ROS.  Medicare Wellness Visit.  Cardiac Risk Factors include: advanced age (>65mn, >>81women);hypertension     Objective:     Vitals: BP 120/64 (BP Location: Left Arm, Patient Position: Sitting, Cuff Size: Normal)   Pulse 60   Temp 98.2 F (36.8 C) (Oral)   Resp 14   Ht 5' 5"  (1.651 m)   Wt 189 lb (85.7 kg)   SpO2 98%   BMI 31.45 kg/m   Body mass index is 31.45 kg/m.   Tobacco History  Smoking Status  . Never Smoker  Smokeless Tobacco  . Never Used    Comment: tobacco use - no      Counseling given: Not Answered   Past Medical History:  Diagnosis Date  . Hypothyroidism    Past Surgical History:  Procedure Laterality Date  . broken ankle - RT    . BUNIONECTOMY  2015   Family History  Problem Relation Age of Onset  . Cancer Mother     sarcoma  . Heart disease Brother   . Cancer Maternal Grandmother     breast  . Breast cancer Maternal Grandmother   . Cancer Paternal Grandmother     breast  . Cancer      family hx  . Thyroid disease      family hx  . Thyroid cancer Neg Hx    History  Sexual Activity  . Sexual activity: No    Outpatient Encounter Prescriptions as of 01/08/2016  Medication Sig  . amLODipine (NORVASC) 10 MG tablet TAKE 1 TABLET BY MOUTH EVERY DAY  . clobetasol (TEMOVATE) 0.05 % external solution APPLY TO AFFECTED AREA ON SCALP AT BEDTIME  . clobetasol cream (TEMOVATE) 0.05 % Apply topically.  . gabapentin (NEURONTIN) 100 MG capsule TAKE 1 CAPSULE (100 MG TOTAL) BY MOUTH 4 (FOUR) TIMES DAILY.  .Marland Kitchenlevothyroxine (SYNTHROID, LEVOTHROID) 100 MCG tablet Take 1 tablet (100 mcg total) by mouth daily.   No facility-administered encounter medications on file as of 01/08/2016.     Activities of Daily Living In your present state of health, do you have any difficulty performing the following  activities: 01/08/2016  Hearing? N  Vision? N  Difficulty concentrating or making decisions? N  Walking or climbing stairs? N  Dressing or bathing? N  Doing errands, shopping? N  Preparing Food and eating ? N  Using the Toilet? N  In the past six months, have you accidently leaked urine? N  Do you have problems with loss of bowel control? N  Managing your Medications? N  Managing your Finances? Y  Housekeeping or managing your Housekeeping? N  Some recent data might be hidden    Patient Care Team: Alexandra Hawthorne FNP as PCP - General (Family Medicine)    Assessment:    This is a routine wellness examination for CErmal The goal of the wellness visit is to assist the patient how to close the gaps in care and create a preventative care plan for the patient.   Osteoporosis risk reviewed.  Medications reviewed; taking without issues or barriers.  Safety issues reviewed; lives with husband.  Smoke detectors in the home. No firearms in the home. Wears seatbelts when driving or riding with others. No violence in the home.  No identified risk were noted; The patient was oriented x 3; appropriate in dress and manner and  no objective failures at ADL's or IADL's.   Body mass index; discussed the importance of a healthy diet, water intake and exercise. Educational material provided.  Health maintenance gaps; closed.  Patient Concerns: None at this time. Follow up with PCP as needed.  Exercise Activities and Dietary recommendations Current Exercise Habits: Structured exercise class (Physical therapy twice weekly, 30 minutes), Type of exercise: walking (Attends [employer], 20 minutes, twice weekly), Time (Minutes): 30, Frequency (Times/Week): 5, Weekly Exercise (Minutes/Week): 150, Intensity: Mild  Goals    . Healthy Lifestyle          Stay hydrated and drink plenty of water. Stay active and continue exercising. Low carb foods.  Educational material  provided.     . Increase water intake          Incorporate more water into diet.  Up to 4 glasses per day.    . Low Carb Foods          Eat less desert.           Fall Risk Fall Risk  01/08/2016 12/24/2015 01/07/2015 12/03/2013 12/08/2012  Falls in the past year? No No Yes Yes No  Number falls in past yr: - - 1 1 -  Injury with Fall? - - Yes No -  Risk for fall due to : - - - History of fall(s) -  Follow up - - Education provided;Falls prevention discussed - -   Depression Screen PHQ 2/9 Scores 01/08/2016 12/24/2015 01/07/2015 12/03/2013  PHQ - 2 Score 0 0 0 0     Cognitive Function MMSE - Mini Mental State Exam 01/08/2016 01/07/2015  Orientation to time 5 5  Orientation to Place 5 5  Registration 3 3  Attention/ Calculation 5 5  Recall 3 3  Language- name 2 objects 2 2  Language- repeat 1 1  Language- follow 3 step command 3 3  Language- read & follow direction 1 1  Write a sentence 1 1  Copy design 1 1  Total score 30 30       Immunization History  Administered Date(s) Administered  . Influenza Split 01/04/2011, 11/24/2011, 12/13/2013  . Influenza,inj,Quad PF,36+ Mos 11/12/2015  . Influenza-Unspecified 01/03/2013, 12/23/2014  . Pneumococcal Conjugate-13 12/03/2013  . Pneumococcal Polysaccharide-23 07/29/2009  . Tdap 01/12/2008  . Zoster 11/24/2011   Screening Tests Health Maintenance  Topic Date Due  . MAMMOGRAM  01/21/2017  . TETANUS/TDAP  01/11/2018  . COLONOSCOPY  05/10/2021  . INFLUENZA VACCINE  Completed  . DEXA SCAN  Completed  . ZOSTAVAX  Completed  . Hepatitis C Screening  Completed      Plan:   End of life planning; Advance aging; Advanced directives discussed. No HCPOA/Living Will.  Additional information provided to help her start the conversation with her family.  Requested a copy of HCPOA/Living Will short forms upon completion.  Time spent with this topic is 30 minutes.   Medicare Attestation I have personally reviewed: The patient's  medical and social history Their use of alcohol, tobacco or illicit drugs Their current medications and supplements The patient's functional ability including ADLs,fall risks, home safety risks, cognitive, and hearing and visual impairment Diet and physical activities Evidence for depression   The patient's weight, height, BMI, and visual acuity have been recorded in the chart.  I have made referrals and provided education to the patient based on review of the above and I have provided the patient with a written personalized care plan for preventive services.  During the course of the visit the patient was educated and counseled about the following appropriate screening and preventive services:   Vaccines to include Pneumoccal, Influenza, Hepatitis B, Td, Zostavax, HCV  Electrocardiogram  Cardiovascular Disease  Colorectal cancer screening  Bone density screening  Diabetes screening  Glaucoma screening  Mammography/PAP  Nutrition counseling   Patient Instructions (the written plan) was given to the patient.   Varney Biles, LPN  [id number]  Agree with above plan, Mable Paris, NP.

## 2016-01-09 DIAGNOSIS — M545 Low back pain: Secondary | ICD-10-CM | POA: Diagnosis not present

## 2016-01-12 DIAGNOSIS — M545 Low back pain: Secondary | ICD-10-CM | POA: Diagnosis not present

## 2016-01-16 DIAGNOSIS — M545 Low back pain: Secondary | ICD-10-CM | POA: Diagnosis not present

## 2016-01-19 DIAGNOSIS — M545 Low back pain: Secondary | ICD-10-CM | POA: Diagnosis not present

## 2016-01-23 ENCOUNTER — Ambulatory Visit: Payer: Medicare Other

## 2016-01-27 ENCOUNTER — Ambulatory Visit: Admission: RE | Admit: 2016-01-27 | Payer: Medicare Other | Source: Ambulatory Visit

## 2016-01-27 ENCOUNTER — Ambulatory Visit
Admission: RE | Admit: 2016-01-27 | Discharge: 2016-01-27 | Disposition: A | Discharge status: Home / Self Care | Payer: Medicare Other | Source: Ambulatory Visit | Attending: Family | Admitting: Family

## 2016-01-27 ENCOUNTER — Other Ambulatory Visit: Payer: Self-pay | Admitting: Family

## 2016-01-27 DIAGNOSIS — Z1231 Encounter for screening mammogram for malignant neoplasm of breast: Secondary | ICD-10-CM | POA: Diagnosis not present

## 2016-01-27 DIAGNOSIS — Z0000 Encounter for general adult medical examination without abnormal findings: Secondary | ICD-10-CM

## 2016-02-11 ENCOUNTER — Telehealth: Payer: Self-pay

## 2016-02-11 DIAGNOSIS — M1990 Unspecified osteoarthritis, unspecified site: Secondary | ICD-10-CM

## 2016-02-11 NOTE — Telephone Encounter (Signed)
Refill request for Gabapentin, last seen [id number], last filled [id number].  Please advise.

## 2016-02-16 MED ORDER — GABAPENTIN 100 MG PO CAPS: ORAL_CAPSULE | ORAL | 1 refills | Status: DC

## 2016-02-16 NOTE — Addendum Note (Signed)
Addended by: Burnard Hawthorne on: 02/16/2016 10:02 AM   Modules accepted: Orders

## 2016-03-01 ENCOUNTER — Telehealth: Payer: Self-pay | Admitting: Family

## 2016-03-29 ENCOUNTER — Telehealth: Payer: Self-pay | Admitting: Family

## 2016-03-29 MED ORDER — AMLODIPINE BESYLATE 10 MG PO TABS: 10.0000 mg | ORAL_TABLET | Freq: Every day | ORAL | 3 refills | Status: DC

## 2016-03-29 NOTE — Telephone Encounter (Signed)
Medication has been refilled.

## 2016-03-29 NOTE — Telephone Encounter (Signed)
Pt is requesting a refill on her amLODipine (NORVASC) 10 MG tablet. Please send prescription to [facility] on  [address] .

## 2016-03-30 ENCOUNTER — Other Ambulatory Visit: Payer: Self-pay | Admitting: Family

## 2016-03-30 ENCOUNTER — Ambulatory Visit
Admission: RE | Admit: 2016-03-30 | Discharge: 2016-03-30 | Disposition: A | Discharge status: Home / Self Care | Payer: Medicare Other | Source: Ambulatory Visit | Attending: Family | Admitting: Family

## 2016-03-30 DIAGNOSIS — M8580 Other specified disorders of bone density and structure, unspecified site: Secondary | ICD-10-CM | POA: Insufficient documentation

## 2016-03-30 DIAGNOSIS — M85851 Other specified disorders of bone density and structure, right thigh: Secondary | ICD-10-CM | POA: Diagnosis not present

## 2016-03-30 DIAGNOSIS — Z780 Asymptomatic menopausal state: Secondary | ICD-10-CM | POA: Diagnosis not present

## 2016-03-30 DIAGNOSIS — Z13820 Encounter for screening for osteoporosis: Secondary | ICD-10-CM | POA: Insufficient documentation

## 2016-03-31 DIAGNOSIS — L820 Inflamed seborrheic keratosis: Secondary | ICD-10-CM | POA: Diagnosis not present

## 2016-03-31 DIAGNOSIS — L570 Actinic keratosis: Secondary | ICD-10-CM | POA: Diagnosis not present

## 2016-03-31 DIAGNOSIS — L538 Other specified erythematous conditions: Secondary | ICD-10-CM | POA: Diagnosis not present

## 2016-03-31 DIAGNOSIS — X32XXXA Exposure to sunlight, initial encounter: Secondary | ICD-10-CM | POA: Diagnosis not present

## 2016-03-31 DIAGNOSIS — L710 Perioral dermatitis: Secondary | ICD-10-CM | POA: Diagnosis not present

## 2016-04-05 ENCOUNTER — Ambulatory Visit (INDEPENDENT_AMBULATORY_CARE_PROVIDER_SITE_OTHER): Payer: Medicare Other | Admitting: Family

## 2016-04-05 ENCOUNTER — Encounter: Payer: Self-pay | Admitting: Family

## 2016-04-05 VITALS — BP 140/80 | HR 87 | Temp 98.0°F | Ht 65.0 in | Wt 190.2 lb

## 2016-04-05 DIAGNOSIS — M85859 Other specified disorders of bone density and structure, unspecified thigh: Secondary | ICD-10-CM | POA: Diagnosis not present

## 2016-04-05 DIAGNOSIS — E663 Overweight: Secondary | ICD-10-CM | POA: Diagnosis not present

## 2016-04-05 DIAGNOSIS — E6609 Other obesity due to excess calories: Secondary | ICD-10-CM

## 2016-04-05 DIAGNOSIS — E66811 Obesity, class 1: Secondary | ICD-10-CM

## 2016-04-05 DIAGNOSIS — I10 Essential (primary) hypertension: Secondary | ICD-10-CM | POA: Diagnosis not present

## 2016-04-05 DIAGNOSIS — J209 Acute bronchitis, unspecified: Secondary | ICD-10-CM

## 2016-04-05 MED ORDER — ALBUTEROL SULFATE HFA 108 (90 BASE) MCG/ACT IN AERS: 2.0000 | INHALATION_SPRAY | Freq: Four times a day (QID) | RESPIRATORY_TRACT | 1 refills | Status: DC | PRN

## 2016-04-05 MED ORDER — AMLODIPINE BESYLATE 10 MG PO TABS
10.0000 mg | ORAL_TABLET | Freq: Every day | ORAL | 3 refills | Status: DC
Start: 2016-04-05 — End: 2017-02-22

## 2016-04-05 NOTE — Patient Instructions (Addendum)
Use albuterol every 6 hours for first 24 hours to get good medication into the lungs and loosen congestion; after, you may use as needed and eventually stop all together when cough resolves. j Increase intake of clear fluids. Congestion is best treated by hydration, when mucus is wetter, it is thinner, less sticky, and easier to expel from the body, either through coughing up drainage, or by blowing your nose.   Get plenty of rest.   Use saline nasal drops and blow your nose frequently. Run a humidifier at night and elevate the head of the bed. Vicks Vapor rub will help with congestion and cough. Steam showers and sinus massage for congestion.   Use Acetaminophen or Ibuprofen as needed for fever or pain. Avoid second hand smoke. Even the smallest exposure will worsen symptoms.    Be sure to just get the plain Mucinex or Robitussin that just has one medication (Guaifenesen). We don't recommend the combination products. Note, be sure to drink two glasses of water with each dose of Mucinex as the medication will not work well without adequate hydration.   You can also try a teaspoon of honey to see if this will help reduce cough. Throat lozenges can sometimes be beneficial as well.    This illness will typically last 7 - 10 days.   Please follow up with our clinic if you develop a fever greater than 101 F, symptoms worsen, or do not resolve in the next week.    Your dexa scan shows osteopenia.   We need to follow this and monitor again in 2 more years to see if improved or worsened.  For post menopausal women, guidelines recommend a diet with 1200 mg of Calcium per day. If you are eating calcium rich foods, you do not need a calcium supplement. The body better absorbs the calcium that you eat over supplementation. If you do supplement, I recommend not supplementing the full 1200 mg/ day as this can lead to increased risk of cardiovascular disease. I recommend Calcium Citrate over the counter, and  you may take a total of 600 to 800 mg per day in divided doses with meals for best absorption.   For bone health, you need adequate vitamin D, and I recommend you supplement as it is harder to do so with diet alone. I recommend cholecalciferol 800 units daily.  Also, please ensure you are following a diet high in calcium -- research shows better outcomes with dietary sources including kale, yogurt, broccolii, cheese, okra, almonds- to name a few.     Also remember that exercise is a great medicine for maintain and preserve bone health. Advise moderate exercise for 30 minutes , 3 times per week.

## 2016-04-05 NOTE — Assessment & Plan Note (Signed)
Discussed results of DEXA scan which showed worsening osteopenia femur. Discussed with patient recommendations for vitamin D, calcium .At this time, patient would like to treat with lifestyle modifications including exercise, diet. We will recheck DEXA scan in 2-3 years to see if improvement or worsening.

## 2016-04-05 NOTE — Progress Notes (Signed)
Pre visit review using our clinic review tool, if applicable. No additional management support is needed unless otherwise documented below in the visit note. 

## 2016-04-05 NOTE — Assessment & Plan Note (Signed)
Control. Continue current regimen. 

## 2016-04-05 NOTE — Assessment & Plan Note (Addendum)
SPO2 93%. Afebrile. No adventitious lung sounds. Reassured that patient is improving. Patient and I agreed to treat conservatively when necessary albuterol inhaler, Mucinex. Return precautions given.

## 2016-04-05 NOTE — Progress Notes (Signed)
Subjective:    Patient ID: Alexandra Bishop, female    DOB: [birth year], 71 y.o.   MRN: [id number]  CC: Alexandra Bishop is a 71 y.o. female who presents today for follow up.   HPI: Here for follow-up DEXA results which revealed osteopenia which was worsening.  HTN- compliant with amlodipine. Denies exertional chest pain or pressure, numbness or tingling radiating to left arm or jaw, palpitations, dizziness, frequent headaches, changes in vision, or shortness of breath.    Weight loss goal- goal 165. Has lost Paleo, 15 pounds. Over the holiday, gained a few pounds when traveling. Not excercising currently. Plans to go back to [employer]    Also c/o cough, improved. Endorses congestion, wheezing. Tried tussin DM, herbal tea with improvement. Endorses mild HA, sinus pressure. No sore throat. No SOB.   Takes vitamin supplement twice per week.             HISTORY:  Past Medical History:  Diagnosis Date  . Hypothyroidism    Past Surgical History:  Procedure Laterality Date  . BREAST BIOPSY Right    neg  . broken ankle - RT    . BUNIONECTOMY  2015   Family History  Problem Relation Age of Onset  . Cancer Mother     sarcoma  . Heart disease Brother   . Cancer Maternal Grandmother     breast  . Breast cancer Maternal Grandmother   . Cancer Paternal Grandmother     breast  . Cancer      family hx  . Thyroid disease      family hx  . Thyroid cancer Neg Hx     Allergies: Cymbalta [duloxetine hcl]; Hydrocodone; Shellfish allergy; and Tramadol Current Outpatient Prescriptions on File Prior to Visit  Medication Sig Dispense Refill  . clobetasol (TEMOVATE) 0.05 % external solution APPLY TO AFFECTED AREA ON SCALP AT BEDTIME  1  . gabapentin (NEURONTIN) 100 MG capsule TAKE 1 CAPSULE (100 MG TOTAL) BY MOUTH 4 (FOUR) TIMES DAILY. 360 capsule 1  . levothyroxine (SYNTHROID, LEVOTHROID) 100 MCG tablet Take 1 tablet (100 mcg total) by mouth daily. 90 tablet 1   No current  facility-administered medications on file prior to visit.     Social History  Substance Use Topics  . Smoking status: Never Smoker  . Smokeless tobacco: Never Used     Comment: tobacco use - no   . Alcohol use No    Review of Systems  Constitutional: Negative for chills and fever.  Respiratory: Positive for cough. Negative for shortness of breath and wheezing.   Cardiovascular: Negative for chest pain and palpitations.  Gastrointestinal: Negative for nausea and vomiting.      Objective:    BP 140/80   Pulse 87   Temp 98 F (36.7 C) (Oral)   Ht _0  (1.651 m)   Wt 190 lb 3.2 oz (86.3 kg)   SpO2 93%   BMI 31.65 kg/m  BP Readings from Last 3 Encounters:  04/05/16 140/80  01/08/16 120/64  12/24/15 122/68   Wt Readings from Last 3 Encounters:  04/05/16 190 lb 3.2 oz (86.3 kg)  01/08/16 189 lb (85.7 kg)  12/24/15 187 lb 11.2 oz (85.1 kg)    Physical Exam  Constitutional: She appears well-developed and well-nourished.  Eyes: Conjunctivae are normal.  Cardiovascular: Normal rate, regular rhythm, normal heart sounds and normal pulses.   Pulmonary/Chest: Effort normal and breath sounds normal. She has no wheezes. She has no rhonchi. She has  no rales.  Neurological: She is alert.  Skin: Skin is warm and dry.  Psychiatric: She has a normal mood and affect. Her speech is normal and behavior is normal. Thought content normal.  Vitals reviewed.      Assessment & Plan:   Problem List Items Addressed This Visit      Cardiovascular and Mediastinum   Hypertension    Control. Continue current regimen.      Relevant Medications   amLODipine (NORVASC) 10 MG tablet     Respiratory   Acute bronchitis - Primary    SPO2 93%. Afebrile. No adventitious lung sounds. Reassured that patient is improving. Patient and I agreed to treat conservatively when necessary albuterol inhaler, Mucinex. Return precautions given.      Relevant Medications   albuterol (PROVENTIL HFA) 108 (90  Base) MCG/ACT inhaler     Musculoskeletal and Integument   Osteopenia of thigh    Discussed results of DEXA scan which showed worsening osteopenia femur. Discussed with patient recommendations for vitamin D, calcium .At this time, patient would like to treat with lifestyle modifications including exercise, diet. We will recheck DEXA scan in 2-3 years to see if improvement or worsening.        Other   Obesity    Patient returns frustrated by her weight. Over the holidays, she was not exercising or following her diet. I congratulated her on her overall weight loss and encouraged her to get back to her regular routine. I have full confidence that she is able to do this.       Other Visit Diagnoses    Overweight           I am having Ms. Busch start on albuterol. I am also having her maintain her clobetasol, levothyroxine, gabapentin, and amLODipine.   Meds ordered this encounter  Medications  . amLODipine (NORVASC) 10 MG tablet    Sig: Take 1 tablet (10 mg total) by mouth daily.    Dispense:  90 tablet    Refill:  3  . albuterol (PROVENTIL HFA) 108 (90 Base) MCG/ACT inhaler    Sig: Inhale 2 puffs into the lungs every 6 (six) hours as needed for wheezing or shortness of breath.    Dispense:  1 Inhaler    Refill:  1    Order Specific Question:   Supervising Provider    Answer:   Crecencio Mc [[id number]]    Return precautions given.   Risks, benefits, and alternatives of the medications and treatment plan prescribed today were discussed, and patient expressed understanding.   Education regarding symptom management and diagnosis given to patient on AVS.  Continue to follow with Mable Paris, FNP for routine health maintenance.   Zigmund Gottron and I agreed with plan.   Mable Paris, FNP

## 2016-04-05 NOTE — Assessment & Plan Note (Signed)
Patient returns frustrated by her weight. Over the holidays, she was not exercising or following her diet. I congratulated her on her overall weight loss and encouraged her to get back to her regular routine. I have full confidence that she is able to do this.

## 2016-04-06 DIAGNOSIS — L900 Lichen sclerosus et atrophicus: Secondary | ICD-10-CM | POA: Diagnosis not present

## 2016-04-08 DIAGNOSIS — L900 Lichen sclerosus et atrophicus: Secondary | ICD-10-CM | POA: Insufficient documentation

## 2016-04-26 ENCOUNTER — Ambulatory Visit (INDEPENDENT_AMBULATORY_CARE_PROVIDER_SITE_OTHER): Payer: Medicare Other | Admitting: Podiatry

## 2016-04-26 ENCOUNTER — Encounter: Payer: Self-pay | Admitting: Podiatry

## 2016-04-26 DIAGNOSIS — I7300 Raynaud's syndrome without gangrene: Secondary | ICD-10-CM | POA: Diagnosis not present

## 2016-04-26 DIAGNOSIS — M21621 Bunionette of right foot: Secondary | ICD-10-CM

## 2016-04-26 NOTE — Progress Notes (Signed)
This patient presents the office today for an evaluation of her right foot. She says she has previously been diagnosed with Raynaud's months and was returning for an evaluation of this condition. She also says was scheduled for nail debridement. At this visit. She says that she had surgery performed previously by Dr. Milinda Pointer for the correction of a tailor's bunion of the right foot. She relates having fractured her right ankle 18 years ago. Finally, she is aware that her fourth toe is rotated under her third toe. She presents the office today for an evaluation of both of her feet   GENERAL APPEARANCE: Alert, conversant. Appropriately groomed. No acute distress.  VASCULAR: Pedal pulses are  palpable at  [facility] and PT bilateral.  Capillary refill time is immediate to all digits,  Normal temperature gradient.   NEUROLOGIC: sensation is normal to 5.07 monofilament at 5/5 sites bilateral.  Light touch is intact bilateral, Muscle strength normal with limited ROM right ankle. Dorsal exostosis midfoot left. MUSCULOSKELETAL: acceptable muscle strength, tone and stability bilateral.  Intrinsic muscluature intact bilateral.  Rectus appearance of foot and digits noted bilateral. S/p tailor bunion correction right foot.  Adductovarus fourth toe right foot.  DERMATOLOGIC: skin color, texture, and turgor are within normal limits.  No preulcerative lesions or ulcers  are seen, no interdigital maceration noted.  No open lesions present.  Digital nails are asymptomatic. No drainage noted.   Raynauds Disease  S/p foot surgery right foot.  ROV.  Patient returns to the office stating she is not having any pain or discomfort in either foot.  No evidence of any wearing on its disease. Discussed her rotated fourth toe and her arthritis left midfoot. She was seen and more interested in seeing Dr. Milinda Pointer then having her foot examined at this time, this probably is due to the fact that he was the surgeon in her right foot.   Gardiner Barefoot DPM

## 2016-05-06 DIAGNOSIS — H5213 Myopia, bilateral: Secondary | ICD-10-CM | POA: Diagnosis not present

## 2016-05-13 ENCOUNTER — Other Ambulatory Visit: Payer: Self-pay

## 2016-05-13 ENCOUNTER — Telehealth: Payer: Self-pay | Admitting: Family

## 2016-05-13 MED ORDER — LEVOTHYROXINE SODIUM 100 MCG PO TABS: 100.0000 ug | ORAL_TABLET | Freq: Every day | ORAL | 1 refills | Status: DC

## 2016-05-13 NOTE — Telephone Encounter (Signed)
Pt states she needs a refill on levothyroxine (SYNTHROID, LEVOTHROID) 100 MCG tablet sent to [address]

## 2016-05-13 NOTE — Telephone Encounter (Signed)
Medication has been filled 

## 2016-05-27 DIAGNOSIS — Z713 Dietary counseling and surveillance: Secondary | ICD-10-CM | POA: Diagnosis not present

## 2016-05-31 ENCOUNTER — Other Ambulatory Visit: Payer: Self-pay

## 2016-05-31 ENCOUNTER — Telehealth: Payer: Self-pay | Admitting: Family

## 2016-05-31 MED ORDER — LEVOTHYROXINE SODIUM 100 MCG PO TABS: 100.0000 ug | ORAL_TABLET | Freq: Every day | ORAL | 1 refills | Status: DC

## 2016-05-31 NOTE — Telephone Encounter (Signed)
Pt is requesting that her levothyroxine (SYNTHROID, LEVOTHROID) 100 MCG tablet be refilled. Please advise pt.  Thanks

## 2016-05-31 NOTE — Telephone Encounter (Signed)
Medication was taken care of [id number]. Patient was notified. She stated she will be going to pharmacy now.

## 2016-05-31 NOTE — Telephone Encounter (Signed)
Medication has been refilled again due to pharmacy not receiving RX [id number].

## 2016-06-25 ENCOUNTER — Telehealth: Payer: Self-pay

## 2016-06-25 ENCOUNTER — Other Ambulatory Visit: Payer: Self-pay

## 2016-06-25 DIAGNOSIS — M1990 Unspecified osteoarthritis, unspecified site: Secondary | ICD-10-CM

## 2016-06-25 NOTE — Telephone Encounter (Signed)
Refill request for Gabapentin, last seen [id number], last filled [id number].  Please advise.

## 2016-06-27 MED ORDER — GABAPENTIN 100 MG PO CAPS
ORAL_CAPSULE | ORAL | 1 refills | Status: DC
Start: 2016-06-27 — End: 2016-06-28

## 2016-06-27 NOTE — Telephone Encounter (Signed)
Let pt know refiilled

## 2016-06-27 NOTE — Addendum Note (Signed)
Addended by: Burnard Hawthorne on: 06/27/2016 08:12 AM   Modules accepted: Orders

## 2016-06-28 ENCOUNTER — Ambulatory Visit (INDEPENDENT_AMBULATORY_CARE_PROVIDER_SITE_OTHER): Payer: Medicare Other | Admitting: Podiatry

## 2016-06-28 ENCOUNTER — Ambulatory Visit: Payer: Medicare Other | Admitting: Podiatry

## 2016-06-28 ENCOUNTER — Encounter: Payer: Self-pay | Admitting: Podiatry

## 2016-06-28 ENCOUNTER — Other Ambulatory Visit: Payer: Self-pay

## 2016-06-28 DIAGNOSIS — Q828 Other specified congenital malformations of skin: Secondary | ICD-10-CM | POA: Diagnosis not present

## 2016-06-28 DIAGNOSIS — M1990 Unspecified osteoarthritis, unspecified site: Secondary | ICD-10-CM

## 2016-06-28 DIAGNOSIS — I7300 Raynaud's syndrome without gangrene: Secondary | ICD-10-CM

## 2016-06-28 MED ORDER — GABAPENTIN 100 MG PO CAPS: ORAL_CAPSULE | ORAL | 1 refills | Status: DC

## 2016-06-28 NOTE — Telephone Encounter (Signed)
Thank you brock  Advise her to call and est with Marisa Severin and she may make an appt with them if she would like .

## 2016-06-28 NOTE — Progress Notes (Signed)
She presents today for her one-year follow-up of her Raynaud's and hammertoe deformities. She relates that she has some distal clavi on the third toes of both feet which are painful and a callus to the plantar aspect of the fifth metatarsal head left foot. She states that she wears pads over the ends of her toes to help prevent the callus from building up.  Objective: Vital signs are stable alert and oriented 3. Pulses are palpable. Neurologic sensorium is intact. Deep tendon reflexes are intact. No signs of Raynaud's at this point. She has rigid hammertoe deformities resulting in distal clavi third digit bilaterally easily debrided. No bleeding is noted. Tailor's bunion deformity with a fifth metatarsal lesion plantarly. This lesion appears to be porokeratotic in nature no signs of infection.  Assessment: Raynaud's appears to be stable. Hammertoe deformity with distal clavi porokeratotic lesions.  Plan: Debrided all reactive hyperkeratotic tissue recommended she continue padding. Follow up with me in 1 year.

## 2016-06-28 NOTE — Telephone Encounter (Signed)
Patient has been notified. Medication has been recent to new pharmacy.  Patient would also like to have another provider. Patient stated she will be coming in to schedule appointment with another provider.

## 2016-06-30 NOTE — Telephone Encounter (Signed)
That is fine 

## 2016-06-30 NOTE — Telephone Encounter (Signed)
Pt is scheduled with Dr.Sonnenberg for June.. Is this ok.. Please advise

## 2016-07-07 DIAGNOSIS — L538 Other specified erythematous conditions: Secondary | ICD-10-CM | POA: Diagnosis not present

## 2016-07-07 DIAGNOSIS — L218 Other seborrheic dermatitis: Secondary | ICD-10-CM | POA: Diagnosis not present

## 2016-07-07 DIAGNOSIS — L820 Inflamed seborrheic keratosis: Secondary | ICD-10-CM | POA: Diagnosis not present

## 2016-07-07 DIAGNOSIS — L298 Other pruritus: Secondary | ICD-10-CM | POA: Diagnosis not present

## 2016-07-07 DIAGNOSIS — L400 Psoriasis vulgaris: Secondary | ICD-10-CM | POA: Diagnosis not present

## 2016-07-14 ENCOUNTER — Ambulatory Visit: Payer: Medicare Other | Admitting: Podiatry

## 2016-08-19 DIAGNOSIS — L218 Other seborrheic dermatitis: Secondary | ICD-10-CM | POA: Diagnosis not present

## 2016-08-19 DIAGNOSIS — L298 Other pruritus: Secondary | ICD-10-CM | POA: Diagnosis not present

## 2016-08-19 DIAGNOSIS — L820 Inflamed seborrheic keratosis: Secondary | ICD-10-CM | POA: Diagnosis not present

## 2016-08-19 DIAGNOSIS — L923 Foreign body granuloma of the skin and subcutaneous tissue: Secondary | ICD-10-CM | POA: Diagnosis not present

## 2016-08-23 ENCOUNTER — Ambulatory Visit (INDEPENDENT_AMBULATORY_CARE_PROVIDER_SITE_OTHER): Payer: Medicare Other | Admitting: Family Medicine

## 2016-08-23 ENCOUNTER — Encounter: Payer: Self-pay | Admitting: Family Medicine

## 2016-08-23 DIAGNOSIS — W57XXXA Bitten or stung by nonvenomous insect and other nonvenomous arthropods, initial encounter: Secondary | ICD-10-CM | POA: Insufficient documentation

## 2016-08-23 DIAGNOSIS — E6609 Other obesity due to excess calories: Secondary | ICD-10-CM

## 2016-08-23 DIAGNOSIS — M5440 Lumbago with sciatica, unspecified side: Secondary | ICD-10-CM

## 2016-08-23 DIAGNOSIS — G8929 Other chronic pain: Secondary | ICD-10-CM | POA: Diagnosis not present

## 2016-08-23 DIAGNOSIS — I872 Venous insufficiency (chronic) (peripheral): Secondary | ICD-10-CM

## 2016-08-23 DIAGNOSIS — S30860A Insect bite (nonvenomous) of lower back and pelvis, initial encounter: Secondary | ICD-10-CM

## 2016-08-23 DIAGNOSIS — E785 Hyperlipidemia, unspecified: Secondary | ICD-10-CM | POA: Diagnosis not present

## 2016-08-23 DIAGNOSIS — E039 Hypothyroidism, unspecified: Secondary | ICD-10-CM

## 2016-08-23 DIAGNOSIS — I10 Essential (primary) hypertension: Secondary | ICD-10-CM | POA: Diagnosis not present

## 2016-08-23 NOTE — Assessment & Plan Note (Signed)
Patient bitten by a tick about a week ago. It was not engorged. No signs of local infection. No signs of systemic infection. Forceps were used to remove what appeared to be retained products of the tick. She tolerated this well and the area was cleansed prior to and after with alcohol swabs. Discussed return precautions regarding systemic signs of infection and local signs of infection. She will contact us if these develop.

## 2016-08-23 NOTE — Assessment & Plan Note (Signed)
She'll continue compression stockings. Continue to follow vascular surgery.

## 2016-08-23 NOTE — Assessment & Plan Note (Signed)
Continue Synthroid. She'll return at the end of August for lab work.

## 2016-08-23 NOTE — Progress Notes (Signed)
  Alexandra Rumps, MD Phone: [phone]  Alexandra Bishop is a 71 y.o. female who presents today for follow-up.  Hypertension: Taking amlodipine. Not checking at home. No chest pain or shortness of breath. Has chronic issues with edema from venous insufficiency. Wears compression stockings and has no issues with this.  Obesity: Has been doing a paleo diet and has lost some weight. She has sort of stabilized though has not been exercising very much.  Spinal stenosis: Occasionally radiates to her right hip. No numbness or weakness. No saddle anesthesia. No incontinence. She uses gabapentin with some benefit. Has not been doing exercises at home that she was given by PT.  Hypothyroidism: No skin changes. No heat or cold intolerance. Weight has been stable. Taking Synthroid.  Patient reports she was bitten by a tick about a week ago. It was not attached for very long. It was in the low back over her spine. There was no engorgement. She brings it in today. No chills or fevers. She feels well overall.  PMH: nonsmoker.   ROS see history of present illness  Objective  Physical Exam Vitals:   08/23/16 1045  BP: 124/78  Pulse: 67  Temp: 98.8 F (37.1 C)    BP Readings from Last 3 Encounters:  08/23/16 124/78  04/05/16 140/80  01/08/16 120/64   Wt Readings from Last 3 Encounters:  08/23/16 193 lb 12.8 oz (87.9 kg)  04/05/16 190 lb 3.2 oz (86.3 kg)  01/08/16 189 lb (85.7 kg)    Physical Exam  Constitutional: No distress.  Cardiovascular: Normal rate, regular rhythm and normal heart sounds.   Pulmonary/Chest: Effort normal and breath sounds normal.  Musculoskeletal:  No midline spine tenderness, no midline spine step-off, no muscular back tenderness, there is a small area over the midline lumbar spine where the tick was previously attached, there is minimal immediately adjacent surrounding erythema though no spreading erythema or induration or tenderness, the area was cleansed with an  alcohol swab and sterile forceps were used to pull out what appeared to be a retained piece of the tick, there is no bleeding, the area was again swabbed with alcohol to cleanse after this material was removed, she tolerated this well  Neurological: She is alert. Gait normal.  5/5 strength bilateral quads, hamstrings, plantar flexion, and dorsiflexion, sensation to light touch intact in bilateral lower extremities  Skin: Skin is warm and dry. She is not diaphoretic.     Assessment/Plan: Please see individual problem list.  Hypertension At goal. Continue current medications.  Venous insufficiency of leg She'll continue compression stockings. Continue to follow vascular surgery.  Hypothyroidism Continue Synthroid. She'll return at the end of August for lab work.  Hyperlipidemia Continue diet and exercise.  Chronic low back pain Chronic issue. Unchanged from prior. Encouraged her to do the exercises provided to her by physical therapy. Continue gabapentin. Continue to monitor. Given return precautions.  Tick bite Patient bitten by a tick about a week ago. It was not engorged. No signs of local infection. No signs of systemic infection. Forceps were used to remove what appeared to be retained products of the tick. She tolerated this well and the area was cleansed prior to and after with alcohol swabs. Discussed return precautions regarding systemic signs of infection and local signs of infection. She will contact us if these develop.  Obesity Encouraged diet and exercise.  Alexandra Rumps, MD [place]

## 2016-08-23 NOTE — Patient Instructions (Signed)
Nice to see you. We will have you return for lab work at the end of August. Please start the exercises for your core. Please monitor the area where you were bitten by the tick. I did remove what was still attached. If you develop any spreading erythema, fevers, chills, body aches, or any new symptoms please seek medical attention.

## 2016-08-23 NOTE — Assessment & Plan Note (Signed)
Chronic issue. Unchanged from prior. Encouraged her to do the exercises provided to her by physical therapy. Continue gabapentin. Continue to monitor. Given return precautions.

## 2016-08-23 NOTE — Assessment & Plan Note (Signed)
Continue diet and exercise. 

## 2016-08-23 NOTE — Assessment & Plan Note (Signed)
At goal. Continue current medications. 

## 2016-08-23 NOTE — Assessment & Plan Note (Signed)
Encouraged diet and exercise.  

## 2016-09-02 DIAGNOSIS — L821 Other seborrheic keratosis: Secondary | ICD-10-CM | POA: Diagnosis not present

## 2016-09-13 ENCOUNTER — Ambulatory Visit (INDEPENDENT_AMBULATORY_CARE_PROVIDER_SITE_OTHER): Payer: Medicare Other | Admitting: Vascular Surgery

## 2016-09-13 ENCOUNTER — Encounter (INDEPENDENT_AMBULATORY_CARE_PROVIDER_SITE_OTHER): Payer: Self-pay | Admitting: Vascular Surgery

## 2016-09-13 VITALS — BP 140/76 | HR 64 | Resp 15 | Ht 66.5 in | Wt 195.0 lb

## 2016-09-13 DIAGNOSIS — I890 Lymphedema, not elsewhere classified: Secondary | ICD-10-CM

## 2016-09-13 DIAGNOSIS — I872 Venous insufficiency (chronic) (peripheral): Secondary | ICD-10-CM

## 2016-09-13 DIAGNOSIS — E785 Hyperlipidemia, unspecified: Secondary | ICD-10-CM

## 2016-09-13 DIAGNOSIS — I10 Essential (primary) hypertension: Secondary | ICD-10-CM | POA: Diagnosis not present

## 2016-09-15 DIAGNOSIS — I890 Lymphedema, not elsewhere classified: Secondary | ICD-10-CM | POA: Insufficient documentation

## 2016-09-15 NOTE — Progress Notes (Signed)
MRN : [id number]  Alexandra Bishop is a 71 y.o. (Apr 09, 1945) female who presents with chief complaint of  Chief Complaint  Patient presents with  . Re-evaluation    1 year no studies  .  History of Present Illness: The patient returns to the office for followup evaluation regarding leg swelling.  The swelling has improved quite a bit and the pain associated with swelling has decreased substantially. There have not been any interval development of a ulcerations or wounds.  Since the previous visit the patient has been wearing graduated compression stockings and has noted little significant improvement in the lymphedema. The patient has been using compression routinely morning until night.  The patient also states elevation during the day and exercise is being done too.     Current Meds  Medication Sig  . amLODipine (NORVASC) 10 MG tablet Take 1 tablet (10 mg total) by mouth daily.  . clobetasol cream (TEMOVATE) 0.05 % Apply topically.  . clotrimazole (LOTRIMIN) 1 % cream Apply topically.  . gabapentin (NEURONTIN) 100 MG capsule TAKE 1 CAPSULE (100 MG TOTAL) BY MOUTH 4 (FOUR) TIMES DAILY.  . hydrocortisone cream 1 % Apply topically.  Marland Kitchen levothyroxine (SYNTHROID, LEVOTHROID) 100 MCG tablet Take 1 tablet (100 mcg total) by mouth daily.    Past Medical History:  Diagnosis Date  . Hypothyroidism     Past Surgical History:  Procedure Laterality Date  . BREAST BIOPSY Right    neg  . broken ankle - RT    . BUNIONECTOMY  2015    Social History Social History  Substance Use Topics  . Smoking status: Never Smoker  . Smokeless tobacco: Never Used     Comment: tobacco use - no   . Alcohol use No    Family History Family History  Problem Relation Age of Onset  . Cancer Mother        sarcoma  . Heart disease Brother   . Cancer Maternal Grandmother        breast  . Breast cancer Maternal Grandmother   . Cancer Paternal Grandmother        breast  . Cancer Unknown    family hx  . Thyroid disease Unknown        family hx  . Thyroid cancer Neg Hx     Allergies  Allergen Reactions  . Cymbalta [Duloxetine Hcl]     Dizziness  . Hydrocodone     Feels Jittery  . Shellfish Allergy     Other reaction(s): Vomiting  . Tramadol     Feels jittery     REVIEW OF SYSTEMS (Negative unless checked)  Constitutional: [] Weight loss  [] Fever  [] Chills Cardiac: [] Chest pain   [] Chest pressure   [] Palpitations   [] Shortness of breath when laying flat   [] Shortness of breath with exertion. Vascular:  [] Pain in legs with walking   [] Pain in legs at rest  [] History of DVT   [] Phlebitis   [x] Swelling in legs   [x] Varicose veins   [] Non-healing ulcers Pulmonary:   [] Uses home oxygen   [] Productive cough   [] Hemoptysis   [] Wheeze  [] COPD   [] Asthma Neurologic:  [] Dizziness   [] Seizures   [] History of stroke   [] History of TIA  [] Aphasia   [] Vissual changes   [] Weakness or numbness in arm   [] Weakness or numbness in leg Musculoskeletal:   [] Joint swelling   [x] Joint pain   [] Low back pain Hematologic:  [] Easy bruising  [] Easy bleeding   [] Hypercoagulable  state   [] Anemic Gastrointestinal:  [] Diarrhea   [] Vomiting  [] Gastroesophageal reflux/heartburn   [] Difficulty swallowing. Genitourinary:  [] Chronic kidney disease   [] Difficult urination  [] Frequent urination   [] Blood in urine Skin:  [x] Rashes   [] Ulcers  Psychological:  [] History of anxiety   []  History of major depression.  Physical Examination  Vitals:   09/13/16 1307  BP: 140/76  Pulse: 64  Resp: 15  Weight: 88.5 kg (195 lb)  Height: 5' 6.5" (1.689 m)   Body mass index is 31 kg/m. Gen: WD/WN, NAD Head: [place]/AT, No temporalis wasting.  Ear/Nose/Throat: Hearing grossly intact, nares w/o erythema or drainage Eyes: PER, EOMI, sclera nonicteric.  Neck: Supple, no large masses.   Pulmonary:  Good air movement, no audible wheezing bilaterally, no use of accessory muscles.  Cardiac: RRR, no JVD Vascular: Mild  to moderate venous stasis changes bilateral ankles no open wounds or sores. There are scattered small varicosities and spider veins noted Vessel Right Left  Radial Palpable Palpable  Ulnar Palpable Palpable  Brachial Palpable Palpable  Carotid Palpable Palpable  Femoral Palpable Palpable  Popliteal Palpable Palpable  PT Palpable Palpable  DP Palpable Palpable  Gastrointestinal: Non-distended. No guarding/no peritoneal signs.  Musculoskeletal: M/S 5/5 throughout.  No deformity or atrophy.  Neurologic: CN 2-12 intact. Symmetrical.  Speech is fluent. Motor exam as listed above. Psychiatric: Judgment intact, Mood & affect appropriate for pt's clinical situation. Dermatologic: No rashes or ulcers noted.  No changes consistent with cellulitis. Lymph : No lichenification or skin changes of chronic lymphedema.  CBC Lab Results  Component Value Date   WBC 6.7 12/31/2015   HGB 15.5 (H) 12/31/2015   HCT 46.6 (H) 12/31/2015   MCV 83.4 12/31/2015   PLT 238.0 12/31/2015    BMET    Component Value Date/Time   NA 141 12/31/2015 0904   K 4.0 12/31/2015 0904   CL 107 12/31/2015 0904   CO2 26 12/31/2015 0904   GLUCOSE 99 12/31/2015 0904   BUN 13 12/31/2015 0904   CREATININE 0.86 12/31/2015 0904   CALCIUM 9.1 12/31/2015 0904   CrCl cannot be calculated (Patient's most recent lab result is older than the maximum 21 days allowed.).  COAG No results found for: INR, PROTIME  Radiology No results found.   Assessment/Plan 1. Venous insufficiency of both lower extremities No surgery or intervention at this point in time.  I have reviewed my discussion with the patient regarding venous insufficiency and why it causes symptoms. I have discussed with the patient the chronic skin changes that accompany venous insufficiency and the long term sequela such as ulceration. Patient will contnue wearing graduated compression stockings on a daily basis, as this has provided excellent control of his  edema. The patient will put the stockings on first thing in the morning and removing them in the evening. The patient is reminded not to sleep in the stockings.  In addition, behavioral modification including elevation during the day will be initiated. Exercise is strongly encouraged.  Duplex ultrasound of the bilateral lower extremity shows patent deep venous system no reflux identified of the superficial system  Given the patient's good control and lack of any problems regarding the venous insufficiency and lymphedema a lymph pump in not need at this time.  The patient will follow up with me PRN should anything change.  The patient voices agreement with this plan.   2. Lymphedema See #1  3. Essential hypertension Continue antihypertensive medications as already ordered, these medications have been  reviewed and there are no changes at this time.   4. Hyperlipidemia, unspecified hyperlipidemia type Continue statin as ordered and reviewed, no changes at this time     Hortencia Pilar, MD  09/15/2016 9:54 AM

## 2016-10-12 DIAGNOSIS — L900 Lichen sclerosus et atrophicus: Secondary | ICD-10-CM | POA: Diagnosis not present

## 2016-10-18 DIAGNOSIS — Z23 Encounter for immunization: Secondary | ICD-10-CM | POA: Diagnosis not present

## 2016-10-18 DIAGNOSIS — L218 Other seborrheic dermatitis: Secondary | ICD-10-CM | POA: Diagnosis not present

## 2016-11-02 DIAGNOSIS — L299 Pruritus, unspecified: Secondary | ICD-10-CM | POA: Diagnosis not present

## 2016-11-11 ENCOUNTER — Telehealth: Payer: Self-pay

## 2016-11-11 ENCOUNTER — Other Ambulatory Visit: Payer: Self-pay | Admitting: Family Medicine

## 2016-11-11 DIAGNOSIS — D582 Other hemoglobinopathies: Secondary | ICD-10-CM

## 2016-11-11 DIAGNOSIS — I10 Essential (primary) hypertension: Secondary | ICD-10-CM

## 2016-11-11 DIAGNOSIS — E039 Hypothyroidism, unspecified: Secondary | ICD-10-CM

## 2016-11-11 NOTE — Telephone Encounter (Signed)
Patient notified

## 2016-11-11 NOTE — Telephone Encounter (Signed)
Patient would like to know what labs she needs to have done at the end of august please advise and place orders

## 2016-11-11 NOTE — Telephone Encounter (Signed)
Orders placed.

## 2016-11-15 ENCOUNTER — Other Ambulatory Visit: Payer: Self-pay | Admitting: Family

## 2016-11-23 ENCOUNTER — Other Ambulatory Visit (INDEPENDENT_AMBULATORY_CARE_PROVIDER_SITE_OTHER): Payer: Medicare Other

## 2016-11-23 DIAGNOSIS — I10 Essential (primary) hypertension: Secondary | ICD-10-CM | POA: Diagnosis not present

## 2016-11-23 DIAGNOSIS — D582 Other hemoglobinopathies: Secondary | ICD-10-CM

## 2016-11-23 DIAGNOSIS — E039 Hypothyroidism, unspecified: Secondary | ICD-10-CM

## 2016-11-23 LAB — CBC
HCT: 47.9 % — ABNORMAL HIGH (ref 36.0–46.0)
Hemoglobin: 15.6 g/dL — ABNORMAL HIGH (ref 12.0–15.0)
MCHC: 32.6 g/dL (ref 30.0–36.0)
MCV: 84.8 fl (ref 78.0–100.0)
Platelets: 271 10*3/uL (ref 150.0–400.0)
RBC: 5.65 Mil/uL — ABNORMAL HIGH (ref 3.87–5.11)
RDW: 13.9 % (ref 11.5–15.5)
WBC: 7.2 10*3/uL (ref 4.0–10.5)

## 2016-11-23 LAB — COMPREHENSIVE METABOLIC PANEL
ALT: 18 U/L (ref 0–35)
AST: 23 U/L (ref 0–37)
Albumin: 4.1 g/dL (ref 3.5–5.2)
Alkaline Phosphatase: 82 U/L (ref 39–117)
BUN: 12 mg/dL (ref 6–23)
CO2: 23 mEq/L (ref 19–32)
Calcium: 9.2 mg/dL (ref 8.4–10.5)
Chloride: 107 mEq/L (ref 96–112)
Creatinine, Ser: 0.85 mg/dL (ref 0.40–1.20)
GFR: 69.62 mL/min (ref >60.00)
Glucose, Bld: 133 mg/dL — ABNORMAL HIGH (ref 70–99)
Potassium: 4 mEq/L (ref 3.5–5.1)
Sodium: 139 mEq/L (ref 135–145)
Total Bilirubin: 0.5 mg/dL (ref 0.2–1.2)
Total Protein: 6.3 g/dL (ref 6.0–8.3)

## 2016-11-23 LAB — TSH: TSH: 0.9 u[IU]/mL (ref 0.35–4.50)

## 2016-11-26 ENCOUNTER — Telehealth: Payer: Self-pay

## 2016-12-16 DIAGNOSIS — L218 Other seborrheic dermatitis: Secondary | ICD-10-CM | POA: Diagnosis not present

## 2016-12-24 ENCOUNTER — Other Ambulatory Visit: Payer: Self-pay | Admitting: Family Medicine

## 2016-12-24 DIAGNOSIS — Z1231 Encounter for screening mammogram for malignant neoplasm of breast: Secondary | ICD-10-CM

## 2016-12-24 DIAGNOSIS — Z23 Encounter for immunization: Secondary | ICD-10-CM | POA: Diagnosis not present

## 2017-01-05 ENCOUNTER — Other Ambulatory Visit: Payer: Self-pay | Admitting: Family

## 2017-01-05 DIAGNOSIS — M1990 Unspecified osteoarthritis, unspecified site: Secondary | ICD-10-CM

## 2017-01-10 ENCOUNTER — Ambulatory Visit (INDEPENDENT_AMBULATORY_CARE_PROVIDER_SITE_OTHER): Payer: Medicare Other

## 2017-01-10 VITALS — BP 126/78 | HR 66 | Temp 98.0°F | Resp 14 | Ht 65.0 in | Wt 196.8 lb

## 2017-01-10 DIAGNOSIS — Z1331 Encounter for screening for depression: Secondary | ICD-10-CM | POA: Diagnosis not present

## 2017-01-10 DIAGNOSIS — Z0000 Encounter for general adult medical examination without abnormal findings: Secondary | ICD-10-CM

## 2017-01-10 NOTE — Patient Instructions (Addendum)
  Alexandra Bishop , Thank you for taking time to come for your Medicare Wellness Visit. I appreciate your ongoing commitment to your health goals. Please review the following plan we discussed and let me know if I can assist you in the future.   Follow up with Dr. Caryl Bis as needed.    Bring a copy of your [place] and/or Living Will to be scanned into chart.  Have a great day!  These are the goals we discussed: Goals    . Increase physical activity          Physical therapy exercise Walk for exercise    . Low Carb Foods                    This is a list of the screening recommended for you and due dates:  Health Maintenance  Topic Date Due  . Tetanus Vaccine  01/11/2018  . Mammogram  01/26/2018  . Colon Cancer Screening  05/10/2021  . Flu Shot  Completed  . DEXA scan (bone density measurement)  Completed  .  Hepatitis C: One time screening is recommended by Center for Disease Control  (CDC) for  adults born from [birth year] through 1967.   Completed

## 2017-01-10 NOTE — Progress Notes (Signed)
Subjective:   Trent Theisen is a 71 y.o. female who presents for Medicare Annual (Subsequent) preventive examination.  Review of Systems:  No ROS.  Medicare Wellness Visit. Additional risk factors are reflected in the social history.  Cardiac Risk Factors include: advanced age (>103men, >26 women);hypertension;obesity (BMI >30kg/m2)     Objective:     Vitals: BP 126/78 (BP Location: Left Arm, Patient Position: Sitting, Cuff Size: Normal)   Pulse 66   Temp 98 F (36.7 C) (Oral)   Resp 14   Ht 5\' 5"  (1.651 m)   Wt 196 lb 12.8 oz (89.3 kg)   SpO2 98%   BMI 32.75 kg/m   Body mass index is 32.75 kg/m.   Tobacco History  Smoking Status  . Never Smoker  Smokeless Tobacco  . Never Used    Comment: tobacco use - no      Counseling given: Not Answered   Past Medical History:  Diagnosis Date  . Hypothyroidism    Past Surgical History:  Procedure Laterality Date  . BREAST BIOPSY Right    neg  . broken ankle - RT    . BUNIONECTOMY  2015   Family History  Problem Relation Age of Onset  . Cancer Mother        sarcoma  . Heart disease Brother   . Cancer Maternal Grandmother        breast  . Breast cancer Maternal Grandmother   . Cancer Paternal Grandmother        breast  . Cancer Unknown        family hx  . Thyroid disease Unknown        family hx  . Thyroid cancer Neg Hx    History  Sexual Activity  . Sexual activity: No    Outpatient Encounter Prescriptions as of 01/10/2017  Medication Sig  . amLODipine (NORVASC) 10 MG tablet Take 1 tablet (10 mg total) by mouth daily.  . clobetasol cream (TEMOVATE) 0.05 % Apply topically.  . clotrimazole (LOTRIMIN) 1 % cream Apply topically.  . gabapentin (NEURONTIN) 100 MG capsule TAKE 1 CAPSULE (100 MG TOTAL) BY MOUTH 4 (FOUR) TIMES DAILY.  . hydrocortisone cream 1 % Apply topically.  Marland Kitchen levothyroxine (SYNTHROID, LEVOTHROID) 100 MCG tablet TAKE 1 TABLET BY MOUTH ONCE DAILY  . fluocinonide (LIDEX) 0.05 % external  solution Apply topically.   No facility-administered encounter medications on file as of 01/10/2017.     Activities of Daily Living In your present state of health, do you have any difficulty performing the following activities: 01/10/2017  Hearing? N  Vision? N  Difficulty concentrating or making decisions? N  Walking or climbing stairs? N  Dressing or bathing? N  Doing errands, shopping? N  Preparing Food and eating ? N  Using the Toilet? N  In the past six months, have you accidently leaked urine? N  Do you have problems with loss of bowel control? N  Managing your Medications? N  Managing your Finances? N  Housekeeping or managing your Housekeeping? N  Some recent data might be hidden    Patient Care Team: Leone Haven, MD as PCP - General (Family Medicine)    Assessment:    This is a routine wellness examination for Shamonique. The goal of the wellness visit is to assist the patient how to close the gaps in care and create a preventative care plan for the patient.   The roster of all physicians providing medical care to patient is listed  in the Snapshot section of the chart.  Taking calcium as appropriate/Osteoporosis risk reviewed.    Safety issues reviewed; Smoke and carbon monoxide detectors in the home. No firearms in the home.  Wears seatbelts when driving or riding with others. Patient does wear sunscreen or protective clothing when in direct sunlight. No violence in the home.  Depression- PHQ 2 &9 complete.  No signs/symptoms or verbal communication regarding little pleasure in doing things, feeling down, depressed or hopeless. No changes in sleeping, energy, eating, concentrating.  No thoughts of self harm or harm towards others.  Time spent on this topic is 8 minutes.   Patient is alert, normal appearance, oriented to person/place/and time.  Correctly identified the president of the [place], recall of 2/3 words, and performing simple calculations. Displays  appropriate judgement and can read correct time from watch face.   No new identified risk were noted.  No failures at ADL's or IADL's.    BMI- discussed the importance of a healthy diet, water intake and the benefits of aerobic exercise. Educational material provided.   24 hour diet recall: Breakfast: egg casserole  Lunch: chinese buffet  Dinner: yogurt Snack: ice cream, pudding  Daily fluid intake: 0 cups of caffeine, 4 cups of water  Dental- every 4 months.  Dr. Toy Cookey.  Eye- Visual acuity not assessed per patient preference since they have regular follow up with the ophthalmologist.  Wears corrective lenses.  Sleep patterns- Sleeps 7-8 hours at night.  Wakes feeling rested.  Naps during the day.  Health maintenance gaps- closed.  Patient Concerns: None at this time. Follow up with PCP as needed.  Exercise Activities and Dietary recommendations Current Exercise Habits: Home exercise routine, Type of exercise: calisthenics, Time (Minutes): 30, Frequency (Times/Week): 2, Weekly Exercise (Minutes/Week): 60, Intensity: Moderate  Goals    . Increase physical activity          Physical therapy exercise Walk for exercise    . Low Carb Foods                   Fall Risk Fall Risk  01/10/2017 01/08/2016 12/24/2015 01/07/2015 12/03/2013  Falls in the past year? No No No Yes Yes  Number falls in past yr: - - - 1 1  Comment - - - - last week when walking, lost balance  Injury with Fall? - - - Yes No  Comment - - - Left knee pain.  XRAY completed. -  Risk for fall due to : - - - - History of fall(s)  Follow up - - - Education provided;Falls prevention discussed -   Depression Screen PHQ 2/9 Scores 01/10/2017 01/08/2016 12/24/2015 01/07/2015  PHQ - 2 Score 0 0 0 0  PHQ- 9 Score 0 - - -     Cognitive Function MMSE - Mini Mental State Exam 01/10/2017 01/08/2016 01/07/2015  Orientation to time 5 5 5   Orientation to Place 5 5 5   Registration 3 3 3   Attention/ Calculation  5 5 5   Recall 3 3 3   Language- name 2 objects 2 2 2   Language- repeat 1 1 1   Language- follow 3 step command 3 3 3   Language- read & follow direction 1 1 1   Write a sentence 1 1 1   Copy design 1 1 1   Total score 30 30 30         Immunization History  Administered Date(s) Administered  . Influenza Split 01/04/2011, 11/24/2011, 12/13/2013  . Influenza,inj,Quad PF,6+ Mos 11/12/2015  .  Influenza-Unspecified 01/03/2013, 12/23/2014, 12/24/2016  . Pneumococcal Conjugate-13 12/03/2013  . Pneumococcal Polysaccharide-23 07/29/2009  . Tdap 01/12/2008  . Zoster 11/24/2011   Screening Tests Health Maintenance  Topic Date Due  . TETANUS/TDAP  01/11/2018  . MAMMOGRAM  01/26/2018  . COLONOSCOPY  05/10/2021  . INFLUENZA VACCINE  Completed  . DEXA SCAN  Completed  . Hepatitis C Screening  Completed      Plan:   End of life planning; Advanced aging; Advanced directives discussed.  No HCPOA/Living Will.  Additional information declined at this time.  I have personally reviewed and noted the following in the patient's chart:   . Medical and social history . Use of alcohol, tobacco or illicit drugs  . Current medications and supplements . Functional ability and status . Nutritional status . Physical activity . Advanced directives . List of other physicians . Hospitalizations, surgeries, and ER visits in previous 12 months . Vitals . Screenings to include cognitive, depression, and falls . Referrals and appointments  In addition, I have reviewed and discussed with patient certain preventive protocols, quality metrics, and best practice recommendations. A written personalized care plan for preventive services as well as general preventive health recommendations were provided to patient.     Varney Biles, LPN  [id number]

## 2017-01-27 ENCOUNTER — Ambulatory Visit
Admission: RE | Admit: 2017-01-27 | Discharge: 2017-01-27 | Disposition: A | Discharge status: Home / Self Care | Payer: Medicare Other | Source: Ambulatory Visit | Attending: Family Medicine | Admitting: Family Medicine

## 2017-01-27 DIAGNOSIS — Z1231 Encounter for screening mammogram for malignant neoplasm of breast: Secondary | ICD-10-CM | POA: Diagnosis not present

## 2017-02-22 ENCOUNTER — Encounter: Payer: Self-pay | Admitting: Family Medicine

## 2017-02-22 ENCOUNTER — Ambulatory Visit (INDEPENDENT_AMBULATORY_CARE_PROVIDER_SITE_OTHER): Payer: Medicare Other | Admitting: Family Medicine

## 2017-02-22 VITALS — BP 130/70 | HR 75 | Temp 98.4°F | Wt 204.4 lb

## 2017-02-22 DIAGNOSIS — B372 Candidiasis of skin and nail: Secondary | ICD-10-CM

## 2017-02-22 DIAGNOSIS — G8929 Other chronic pain: Secondary | ICD-10-CM | POA: Diagnosis not present

## 2017-02-22 DIAGNOSIS — M5440 Lumbago with sciatica, unspecified side: Secondary | ICD-10-CM | POA: Diagnosis not present

## 2017-02-22 DIAGNOSIS — L409 Psoriasis, unspecified: Secondary | ICD-10-CM

## 2017-02-22 DIAGNOSIS — E785 Hyperlipidemia, unspecified: Secondary | ICD-10-CM | POA: Diagnosis not present

## 2017-02-22 DIAGNOSIS — E6609 Other obesity due to excess calories: Secondary | ICD-10-CM | POA: Diagnosis not present

## 2017-02-22 DIAGNOSIS — E039 Hypothyroidism, unspecified: Secondary | ICD-10-CM

## 2017-02-22 DIAGNOSIS — I10 Essential (primary) hypertension: Secondary | ICD-10-CM | POA: Diagnosis not present

## 2017-02-22 DIAGNOSIS — R7309 Other abnormal glucose: Secondary | ICD-10-CM

## 2017-02-22 LAB — HEMOGLOBIN A1C: Hgb A1c MFr Bld: 5.7 % (ref 4.6–6.5)

## 2017-02-22 LAB — LIPID PANEL
Cholesterol: 213 mg/dL — ABNORMAL HIGH (ref 0–200)
HDL: 63.7 mg/dL (ref >39.00)
NonHDL: 149.7
Total CHOL/HDL Ratio: 3
Triglycerides: 217 mg/dL — ABNORMAL HIGH (ref 0.0–149.0)
VLDL: 43.4 mg/dL — ABNORMAL HIGH (ref 0.0–40.0)

## 2017-02-22 LAB — LDL CHOLESTEROL, DIRECT: Direct LDL: 111 mg/dL

## 2017-02-22 LAB — TSH: TSH: 3.38 u[IU]/mL (ref 0.35–4.50)

## 2017-02-22 MED ORDER — LEVOTHYROXINE SODIUM 100 MCG PO TABS: 100.0000 ug | ORAL_TABLET | Freq: Every day | ORAL | 1 refills | Status: DC

## 2017-02-22 MED ORDER — NYSTATIN 100000 UNIT/GM EX CREA: 1.0000 "application " | TOPICAL_CREAM | Freq: Two times a day (BID) | CUTANEOUS | 0 refills | Status: DC

## 2017-02-22 MED ORDER — AMLODIPINE BESYLATE 10 MG PO TABS: 10.0000 mg | ORAL_TABLET | Freq: Every day | ORAL | 3 refills | Status: DC

## 2017-02-22 NOTE — Assessment & Plan Note (Signed)
Continue Synthroid.  Check TSH. 

## 2017-02-22 NOTE — Assessment & Plan Note (Signed)
Treating with nystatin.

## 2017-02-22 NOTE — Progress Notes (Signed)
The 10-year ASCVD risk score Mikey Bussing DC Brooke Bonito., et al., 2013) is: 17.7%   Values used to calculate the score:     Age: 71 years     Sex: Female     Is Non-Hispanic African American: No     Diabetic: No     Tobacco smoker: No     Systolic Blood Pressure: 888 mmHg     Is BP treated: Yes     HDL Cholesterol: 63.7 mg/dL     Total Cholesterol: 213 mg/dL

## 2017-02-22 NOTE — Assessment & Plan Note (Signed)
Stable.  She wants to try a slightly increased dose of gabapentin.  She will take gabapentin 200 mg at night and 100 mg in the morning and if not beneficial she can increase the morning dose to 200 mg as well.

## 2017-02-22 NOTE — Assessment & Plan Note (Addendum)
Encouraged diet and exercise.  To start walking 3 days a week.

## 2017-02-22 NOTE — Assessment & Plan Note (Signed)
At goal. Continue current regimen. 

## 2017-02-22 NOTE — Assessment & Plan Note (Signed)
Suspect plaque in scalp is related to psoriasis.  We are going to refer her to a different dermatologist.

## 2017-02-22 NOTE — Patient Instructions (Signed)
Nice to see you. We will check lab work today and contact you with the results. Please start walking for exercise 3 days a week. Please continue to work on your diet. You can take gabapentin 200 mg at night and 100 mg in the morning.  If this is not beneficial you can increase to 200 mg at night and 200 mg in the morning. We will treat your skin yeast with nystatin. We will refer you to a new dermatologist.

## 2017-02-22 NOTE — Progress Notes (Signed)
Tommi Rumps, MD Phone: [phone]  Alexandra Bishop is a 71 y.o. female who presents today for f/u.  Hypertension: Not checking at home.  Taking amlodipine.  No chest pain or shortness of breath.  Has chronic edema that is unchanged.  Hypothyroidism: Taking Synthroid.  Has had lots of scalp issues.  Followed by dermatology.  No heat or cold intolerance.  Her weight is up.  She reports lots of scalp irritation and rash.  Possibly psoriasis related.  Is been followed by [facility] dermatology and uses fluocinonide on it.  Notes it is not improving and she would like to see a different dermatologist.  Obesity: She has tried the whole 30 diet.  She eats fairly healthfully though does eat lots of nuts and does occasionally have bacon and sausage.  Does physical therapy exercises and a small amount of weight training.  She plans to start walking.  Lumbar spinal stenosis: Notes this causes her hip to hurt.  She is on gabapentin and would like to increase to 200 mg at night and to take 100 mg in the morning.  No sedation.  No numbness.  No weakness.  No saddle anesthesia.  No bowel or bladder incontinence.  Social History   Tobacco Use  Smoking Status Never Smoker  Smokeless Tobacco Never Used  Tobacco Comment   tobacco use - no      ROS see history of present illness  Objective  Physical Exam Vitals:   02/22/17 1327  BP: 130/70  Pulse: 75  Temp: 98.4 F (36.9 C)  SpO2: 94%    BP Readings from Last 3 Encounters:  02/22/17 130/70  01/10/17 126/78  09/13/16 140/76   Wt Readings from Last 3 Encounters:  02/22/17 204 lb 6.4 oz (92.7 kg)  01/10/17 196 lb 12.8 oz (89.3 kg)  09/13/16 195 lb (88.5 kg)    Physical Exam  Constitutional: No distress.  Cardiovascular: Normal rate, regular rhythm and normal heart sounds.  Pulmonary/Chest: Effort normal and breath sounds normal.  Musculoskeletal:  No midline spine tenderness, no midline spine step-off, no muscular back tenderness    Neurological: She is alert. Gait normal.  5/5 strength bilateral quads, hamstrings, plantar flexion, and dorsiflexion, sensation light touch intact bilateral lower extremities  Skin: Skin is warm and dry. She is not diaphoretic.  Right posterior scalp with likely psoriatic plaque, right anterior hip area inside his skin fold with candidal intertrigo     Assessment/Plan: Please see individual problem list.  Hypertension At goal.  Continue current regimen.  Hypothyroidism Continue Synthroid.  Check TSH.  Psoriasis Suspect plaque in scalp is related to psoriasis.  We are going to refer her to a different dermatologist.  Chronic low back pain Stable.  She wants to try a slightly increased dose of gabapentin.  She will take gabapentin 200 mg at night and 100 mg in the morning and if not beneficial she can increase the morning dose to 200 mg as well.  Candidal intertrigo Treating with nystatin.  Obesity Encouraged diet and exercise.  To start walking 3 days a week.   [name] was seen today for follow-up.  Diagnoses and all orders for this visit:  Hypothyroidism, unspecified type -     TSH  Elevated glucose -     HgB A1c  Candidal intertrigo -     nystatin cream (MYCOSTATIN); Apply 1 application topically 2 (two) times daily.  Psoriasis -     Ambulatory referral to Dermatology  Essential hypertension  Chronic right-sided low  back pain with sciatica, sciatica laterality unspecified  Class 1 obesity due to excess calories without serious comorbidity in adult, unspecified BMI  Hyperlipidemia, unspecified hyperlipidemia type -     Lipid panel  Other orders -     amLODipine (NORVASC) 10 MG tablet; Take 1 tablet (10 mg total) by mouth daily. -     levothyroxine (SYNTHROID, LEVOTHROID) 100 MCG tablet; Take 1 tablet (100 mcg total) by mouth daily.    Orders Placed This Encounter  Procedures  . TSH  . HgB A1c  . Lipid panel  . Ambulatory referral to Dermatology     Referral Priority:   Routine    Referral Type:   Consultation    Referral Reason:   Specialty Services Required    Requested Specialty:   Dermatology    Number of Visits Requested:   1    Meds ordered this encounter  Medications  . nystatin cream (MYCOSTATIN)    Sig: Apply 1 application topically 2 (two) times daily.    Dispense:  30 g    Refill:  0  . amLODipine (NORVASC) 10 MG tablet    Sig: Take 1 tablet (10 mg total) by mouth daily.    Dispense:  90 tablet    Refill:  3  . levothyroxine (SYNTHROID, LEVOTHROID) 100 MCG tablet    Sig: Take 1 tablet (100 mcg total) by mouth daily.    Dispense:  90 tablet    Refill:  1     Tommi Rumps, MD [place]

## 2017-02-25 ENCOUNTER — Telehealth: Payer: Self-pay | Admitting: Family Medicine

## 2017-02-25 NOTE — Telephone Encounter (Signed)
[facility] contacted and states that Crestor 5mg  for a 3 month supply would be approximately $42.50. Pharmacy tech states increased dosage would be around the same price.   [facility] contacted and states that Crestor 5mg  for the Brand name would be $327.40 for a month supply and $968.96 for 90 day supply. Generic for a month supply is $226.56 and for a 90day supply is $627.98. Pharmacy states that they are not able to give the cost of medication with insurance until prescription is provided.

## 2017-02-25 NOTE — Telephone Encounter (Signed)
Please advise 

## 2017-02-25 NOTE — Telephone Encounter (Signed)
Pt called and notified of pricing [facility] and [facility]. Pt states she [facility] but is wiling to go to [facility] if the price is cheaper. Actual dosage of the medication she would receive is not available, so quotes were given on the lowest dosage 5mg . Please follow up with pt with dosage amount the prescription is going to be written for and pricing.

## 2017-02-26 NOTE — Telephone Encounter (Signed)
Wow those are expensive. We can try lipitor 20 mg as that may be less expensive. Please let the patient know and then I will send this in.

## 2017-03-02 DIAGNOSIS — L249 Irritant contact dermatitis, unspecified cause: Secondary | ICD-10-CM | POA: Diagnosis not present

## 2017-03-02 DIAGNOSIS — B372 Candidiasis of skin and nail: Secondary | ICD-10-CM | POA: Diagnosis not present

## 2017-03-02 DIAGNOSIS — L400 Psoriasis vulgaris: Secondary | ICD-10-CM | POA: Diagnosis not present

## 2017-03-02 MED ORDER — ROSUVASTATIN CALCIUM 10 MG PO TABS: 10.0000 mg | ORAL_TABLET | Freq: Every day | ORAL | 3 refills | Status: DC

## 2017-03-02 NOTE — Telephone Encounter (Signed)
Sent to pharmacy 

## 2017-03-02 NOTE — Addendum Note (Signed)
Addended by: Caryl Bis, Kendric Sindelar G on: 03/02/2017 12:16 PM   Modules accepted: Orders

## 2017-03-02 NOTE — Telephone Encounter (Signed)
Patient would like Crestor 10 mg sent to [facility], informed patient it would cost 12$ for a 1 month supply, goodrx card place at front desk for patient

## 2017-03-03 ENCOUNTER — Telehealth: Payer: Self-pay | Admitting: Family Medicine

## 2017-03-03 MED ORDER — ATORVASTATIN CALCIUM 20 MG PO TABS: 20.0000 mg | ORAL_TABLET | Freq: Every day | ORAL | 3 refills | Status: DC

## 2017-03-03 NOTE — Telephone Encounter (Signed)
Patient states she would like rx to be sent to [facility], she states she did pay for the Crestor so she has a month supply but will pick up Lipitor next month

## 2017-03-03 NOTE — Telephone Encounter (Signed)
Please advise 

## 2017-03-03 NOTE — Telephone Encounter (Signed)
Already sent.

## 2017-03-03 NOTE — Telephone Encounter (Signed)
Pt said she tried to use the goodrx card today. It did not work. Is Dr. Caryl Bis know about the [facility] program. Please advise pt.

## 2017-03-03 NOTE — Addendum Note (Signed)
Addended by: Leone Haven on: 03/03/2017 03:35 PM   Modules accepted: Orders

## 2017-03-03 NOTE — Telephone Encounter (Signed)
I do not know about the [facility] program. I would suggest trying lipitor to see what the cost would be. I will send to her pharmacy. Thanks.

## 2017-03-07 ENCOUNTER — Telehealth: Payer: Self-pay | Admitting: Family Medicine

## 2017-03-07 NOTE — Telephone Encounter (Signed)
fyi

## 2017-03-07 NOTE — Telephone Encounter (Signed)
Pt wanted to let Dr. Caryl Bis know that she thinks the medication that he just put her on is too strong, (pt did not know the name of the medication).   She also returned the Good Rx card because it did not work.  Please call her at [phone].

## 2017-03-07 NOTE — Telephone Encounter (Signed)
Rosuvastatin, patient states it made her "jittery" she stopped taking it Saturday.

## 2017-03-07 NOTE — Telephone Encounter (Signed)
Can you check to see what medication she was talking about and why she thinks this? Thanks.

## 2017-03-08 NOTE — Telephone Encounter (Signed)
Is she willing to try the Lipitor?  If so this has already been sent to her pharmacy.

## 2017-03-09 NOTE — Telephone Encounter (Signed)
Patient states she will start on the lipitor

## 2017-03-09 NOTE — Telephone Encounter (Signed)
Noted.  If she has any issues she should let us know.  Thanks.

## 2017-03-30 DIAGNOSIS — L400 Psoriasis vulgaris: Secondary | ICD-10-CM | POA: Diagnosis not present

## 2017-03-30 DIAGNOSIS — B372 Candidiasis of skin and nail: Secondary | ICD-10-CM | POA: Diagnosis not present

## 2017-03-30 DIAGNOSIS — I781 Nevus, non-neoplastic: Secondary | ICD-10-CM | POA: Diagnosis not present

## 2017-03-30 DIAGNOSIS — L578 Other skin changes due to chronic exposure to nonionizing radiation: Secondary | ICD-10-CM | POA: Diagnosis not present

## 2017-04-04 ENCOUNTER — Ambulatory Visit (INDEPENDENT_AMBULATORY_CARE_PROVIDER_SITE_OTHER): Payer: Medicare Other | Admitting: Vascular Surgery

## 2017-04-04 ENCOUNTER — Encounter (INDEPENDENT_AMBULATORY_CARE_PROVIDER_SITE_OTHER): Payer: Self-pay | Admitting: Vascular Surgery

## 2017-04-04 VITALS — BP 155/76 | HR 64 | Resp 17 | Ht 65.0 in | Wt 207.0 lb

## 2017-04-04 DIAGNOSIS — I872 Venous insufficiency (chronic) (peripheral): Secondary | ICD-10-CM | POA: Diagnosis not present

## 2017-04-04 DIAGNOSIS — I890 Lymphedema, not elsewhere classified: Secondary | ICD-10-CM

## 2017-04-04 NOTE — Progress Notes (Signed)
Subjective:    Patient ID: Alexandra Bishop, female    DOB: 06-08-1945, 72 y.o.   MRN: [id number] Chief Complaint  Patient presents with  . Follow-up    Discuss stockings   Patient presents with a chief complaint of "discussing compression sock options".  The patient has been diagnosed with chronic venous insufficiency and lymphedema in the past.  The patient has been engaging in conservative therapy including wearing medical grade 1 compression stockings, elevating her legs and remaining active which seems to control her bilateral lower extremity edema and discomfort.  The patient recently went to medical To purchase a new pair of compression stockings and feels she was given the wrong size sock.  The patient denies any claudication-like symptoms, rest pain or ulceration to the lower extremity.  The patient denies any worsening bilateral lower extremity edema or discomfort.  The patient denies any fever, nausea or vomiting.   Review of Systems  Constitutional: Negative.   HENT: Negative.   Eyes: Negative.   Respiratory: Negative.   Cardiovascular: Positive for leg swelling.  Gastrointestinal: Negative.   Endocrine: Negative.   Genitourinary: Negative.   Musculoskeletal: Negative.   Skin: Negative.   Allergic/Immunologic: Negative.   Neurological: Negative.   Hematological: Negative.   Psychiatric/Behavioral: Negative.       Objective:   Physical Exam  Constitutional: She is oriented to person, place, and time. She appears well-developed and well-nourished. No distress.  HENT:  Head: Normocephalic and atraumatic.  Eyes: Conjunctivae are normal. Pupils are equal, round, and reactive to light.  Neck: Normal range of motion.  Cardiovascular: Normal rate, regular rhythm, normal heart sounds and intact distal pulses.  Pulses:      Radial pulses are 2+ on the right side, and 2+ on the left side.       Dorsalis pedis pulses are 2+ on the right side, and 2+ on the left side.   Posterior tibial pulses are 2+ on the right side, and 2+ on the left side.  Pulmonary/Chest: Effort normal and breath sounds normal.  Musculoskeletal: Normal range of motion. She exhibits edema (Mild bilateral lower extremity edema noted).  Neurological: She is alert and oriented to person, place, and time.  Skin: Skin is warm and dry. She is not diaphoretic.  Psychiatric: She has a normal mood and affect. Her behavior is normal. Judgment and thought content normal.  Vitals reviewed.  BP (!) 155/76 (BP Location: Right Arm, Patient Position: Sitting)   Pulse 64   Resp 17   Ht 5' 5"  (1.651 m)   Wt 207 lb (93.9 kg)   BMI 34.45 kg/m   Past Medical History:  Diagnosis Date  . Hypothyroidism    Social History   Socioeconomic History  . Marital status: Married    Spouse name: Not on file  . Number of children: Not on file  . Years of education: Not on file  . Highest education level: Not on file  Social Needs  . Financial resource strain: Not on file  . Food insecurity - worry: Not on file  . Food insecurity - inability: Not on file  . Transportation needs - medical: Not on file  . Transportation needs - non-medical: Not on file  Occupational History  . Not on file  Tobacco Use  . Smoking status: Never Smoker  . Smokeless tobacco: Never Used  . Tobacco comment: tobacco use - no   Substance and Sexual Activity  . Alcohol use: No  . Drug use: No  .  Sexual activity: No  Other Topics Concern  . Not on file  Social History Narrative   From [place] originally. Followed daughter to [place] and son in [place].    Married; retired - office work; gets regular exercise - walks a mile most days. [place]. Husband and her are following Paleo diet.    Past Surgical History:  Procedure Laterality Date  . BREAST BIOPSY Right    neg  . broken ankle - RT    . BUNIONECTOMY  2015   Family History  Problem Relation Age of Onset  . Cancer Mother        sarcoma  . Heart  disease Brother   . Cancer Maternal Grandmother        breast  . Breast cancer Maternal Grandmother   . Cancer Paternal Grandmother        breast  . Cancer Unknown        family hx  . Thyroid disease Unknown        family hx  . Thyroid cancer Neg Hx    Allergies  Allergen Reactions  . Cymbalta [Duloxetine Hcl]     Dizziness  . Duloxetine     Other reaction(s): Unknown Dizziness  . Hydrocodone     Feels Jittery  . Shellfish Allergy     Other reaction(s): Vomiting  . Tramadol     Feels jittery      Assessment & Plan:  Patient presents with a chief complaint of "discussing compression sock options".  The patient has been diagnosed with chronic venous insufficiency and lymphedema in the past.  The patient has been engaging in conservative therapy including wearing medical grade 1 compression stockings, elevating her legs and remaining active which seems to control her bilateral lower extremity edema and discomfort.  The patient recently went to medical To purchase a new pair of compression stockings and feels she was given the wrong size sock.  The patient denies any claudication-like symptoms, rest pain or ulceration to the lower extremity.  The patient denies any worsening bilateral lower extremity edema or discomfort.  The patient denies any fever, nausea or vomiting.  1. Lymphedema - Stable The patient will continue wearing medical grade 1 compression stockings We discussed purchasing them from [facility] in [place]. We discussed trying [facility] as the patient was not happy with the services she received from [facility] We discussed purchasing them from [email or web address] The patient was encouraged to wear graduated compression stockings (20-30 mmHg) on a daily basis. The patient was instructed to begin wearing the stockings first thing in the morning and removing them in the evening. The patient was instructed specifically not to sleep in the stockings. Prescription  given.  Elevation during the day will be continued. Anti-inflammatories for pain. The patient will follow up PRN  Information on chronic venous insufficiency and compression stockings was given to the patient. The patient was instructed to call the office in the interim if any worsening edema or ulcerations to the legs, feet or toes occurs. The patient expresses their understanding.  2. Chronic venous insufficiency - Stable As above  Current Outpatient Medications on File Prior to Visit  Medication Sig Dispense Refill  . amLODipine (NORVASC) 10 MG tablet Take 1 tablet (10 mg total) by mouth daily. 90 tablet 3  . aspirin EC 81 MG tablet Take 81 mg by mouth every other day.    Marland Kitchen atorvastatin (LIPITOR) 20 MG tablet Take 1 tablet (20 mg total) by mouth  daily. 90 tablet 3  . CALCIUM-MAGNESIUM-ZINC PO Take by mouth. Twice weekly    . clobetasol cream (TEMOVATE) 0.05 % Apply topically.    . clotrimazole (LOTRIMIN) 1 % cream Apply topically.    . cyanocobalamin 500 MCG tablet Take 500 mcg by mouth once a week.    . fluocinonide (LIDEX) 0.05 % external solution Apply topically.    . gabapentin (NEURONTIN) 100 MG capsule TAKE 1 CAPSULE (100 MG TOTAL) BY MOUTH 4 (FOUR) TIMES DAILY. 360 capsule 1  . hydrocortisone cream 1 % Apply topically.    Marland Kitchen levothyroxine (SYNTHROID, LEVOTHROID) 100 MCG tablet Take 1 tablet (100 mcg total) by mouth daily. 90 tablet 1  . Multiple Vitamin (MULTIVITAMIN) tablet Take 1 tablet by mouth every other day.    . nystatin cream (MYCOSTATIN) Apply 1 application topically 2 (two) times daily. 30 g 0  . pimecrolimus (ELIDEL) 1 % cream Apply topically 2 (two) times daily.    . Potassium Gluconate 595 MG CAPS Take by mouth every other day.    . Probiotic Product (PROBIOTIC PO) Take by mouth once a week.    . rosuvastatin (CRESTOR) 10 MG tablet      No current facility-administered medications on file prior to visit.    There are no Patient Instructions on file for this  visit. No Follow-up on file.   Alessio Bogan A Aundria Bitterman, PA-C

## 2017-04-25 DIAGNOSIS — B372 Candidiasis of skin and nail: Secondary | ICD-10-CM | POA: Diagnosis not present

## 2017-04-25 DIAGNOSIS — D1800 Hemangioma unspecified site: Secondary | ICD-10-CM | POA: Diagnosis not present

## 2017-04-25 DIAGNOSIS — L400 Psoriasis vulgaris: Secondary | ICD-10-CM | POA: Diagnosis not present

## 2017-04-25 DIAGNOSIS — L821 Other seborrheic keratosis: Secondary | ICD-10-CM | POA: Diagnosis not present

## 2017-04-26 ENCOUNTER — Telehealth: Payer: Self-pay | Admitting: Family Medicine

## 2017-04-26 NOTE — Telephone Encounter (Signed)
No worries.  Thank you for confirming.

## 2017-04-26 NOTE — Telephone Encounter (Signed)
Sorry I did ask while in office patient is taking Lipitor.

## 2017-04-26 NOTE — Telephone Encounter (Signed)
Cholesterol is above goal.  She should be working on dietary changes and we can mail a low-cholesterol diet information sheet to her.  Please see if she is taking Crestor or Lipitor.  I can discuss further with her at her follow-up in March.

## 2017-04-26 NOTE — Telephone Encounter (Signed)
Patient came into office with questions concerning her cholesterol want to go over her results advised patient I could give advice and results as given by PCP , for questions concerning  Diet as whether to start Atkins advised she needs appoint ment to discuss with PCP and as to whether she has arthritis advised no DX  In chart , patient stated she would like to be tested.Scheduled patient for  30 minute appointment 06/14/17.

## 2017-05-04 DIAGNOSIS — M79641 Pain in right hand: Secondary | ICD-10-CM | POA: Diagnosis not present

## 2017-05-09 ENCOUNTER — Other Ambulatory Visit: Payer: Self-pay | Admitting: Family

## 2017-05-09 DIAGNOSIS — H2513 Age-related nuclear cataract, bilateral: Secondary | ICD-10-CM | POA: Diagnosis not present

## 2017-05-09 LAB — HM DIABETES EYE EXAM

## 2017-05-13 ENCOUNTER — Encounter: Payer: Self-pay | Admitting: Family Medicine

## 2017-05-24 ENCOUNTER — Telehealth: Payer: Self-pay | Admitting: Family Medicine

## 2017-05-24 DIAGNOSIS — E039 Hypothyroidism, unspecified: Secondary | ICD-10-CM

## 2017-05-24 MED ORDER — LEVOTHYROXINE SODIUM 100 MCG PO TABS: 100.0000 ug | ORAL_TABLET | Freq: Every day | ORAL | 1 refills | Status: DC

## 2017-05-24 NOTE — Telephone Encounter (Signed)
Sent to pharmacy. She will need a TSH in 6 weeks to follow-up after changing medication. Order has been placed. Thanks.

## 2017-05-24 NOTE — Telephone Encounter (Signed)
Please send new rx to pharmacy with ok to change manufacturers

## 2017-05-24 NOTE — Telephone Encounter (Signed)
Left message to return call to schedule 6 week lab, ok for pec to schedule 6 week non fasting lab

## 2017-05-24 NOTE — Telephone Encounter (Signed)
Pt came into office, she has been to her pharmacy to get her levothyroxine (SYNTHROID, LEVOTHROID) 100 MCG tablet refilled. They are backed logged on this medication. Pharmacy can get from another manufacture but pharmacy needs a new prescription. Please advise.

## 2017-05-25 NOTE — Telephone Encounter (Signed)
Patient is scheduled for office visit 06/14/17

## 2017-06-14 ENCOUNTER — Encounter: Payer: Self-pay | Admitting: Family Medicine

## 2017-06-14 ENCOUNTER — Other Ambulatory Visit: Payer: Self-pay

## 2017-06-14 ENCOUNTER — Ambulatory Visit (INDEPENDENT_AMBULATORY_CARE_PROVIDER_SITE_OTHER): Payer: Medicare Other | Admitting: Family Medicine

## 2017-06-14 VITALS — BP 140/72 | HR 81 | Temp 98.6°F | Ht 65.0 in | Wt 208.6 lb

## 2017-06-14 DIAGNOSIS — E785 Hyperlipidemia, unspecified: Secondary | ICD-10-CM

## 2017-06-14 DIAGNOSIS — M25541 Pain in joints of right hand: Secondary | ICD-10-CM

## 2017-06-14 DIAGNOSIS — E039 Hypothyroidism, unspecified: Secondary | ICD-10-CM

## 2017-06-14 DIAGNOSIS — E669 Obesity, unspecified: Secondary | ICD-10-CM

## 2017-06-14 NOTE — Assessment & Plan Note (Signed)
Suspect osteoarthritis as a possible cause.  She also possibly had de Quervain's tenosynovitis that seems somewhat improved based on the note from the walk-in clinic.  She will continue the thumb spica splint.  We will request the x-ray results.  Discussed icing.  Could consider referral to orthopedics if she continues to have issues.

## 2017-06-14 NOTE — Patient Instructions (Signed)
Nice to see you. Please work on diet and exercise. We will get you referred to a nutritionist.  We will contact you with the phone number for you to call them as well. We will request the x-ray results. Please continue with the brace and you can try icing your thumb to see if that is beneficial. Please keep your appointment for labs for next month.

## 2017-06-14 NOTE — Assessment & Plan Note (Signed)
She will return for lab work in April to recheck cholesterol.  Continue Lipitor.  She will work on diet and exercise.  We will get her set up with nutrition in [place].

## 2017-06-14 NOTE — Assessment & Plan Note (Signed)
Has been stable.  We will plan on checking a TSH as planned in April given change in manufactures of her Synthroid.

## 2017-06-14 NOTE — Progress Notes (Signed)
Tommi Rumps, MD Phone: [phone]  Alexandra Bishop is a 72 y.o. female who presents today for f/u.  HYPERLIPIDEMIA Symptoms Chest pain on exertion:  no    Medications: Compliance- taking lipitor Right upper quadrant pain- no  Muscle aches- no He also wants to work on diet and exercise.  She is doing paleo previously though is off that.  She is considering Atkins diet.  She is cut down on fried fatty foods and junk foods going to start walking for exercise.  She is interested in seeing a nutritionist.  She was seen at the walk-in clinic for right dorsal thumb pain.  Notes it is an aching pain.  Occasionally into her thenar eminence.  They did x-rays though I cannot see the results.  She was diagnosed with de Quervain's tenosynovitis and placed in a thumb spica splint.  Also taking ibuprofen 200 mg 2-3 times a day.  She has been resting it.  It has been going on for 3-4 months.  Might be a tiny bit better.  HYPOTHYROIDISM Disease Monitoring Weight changes: no  Skin Changes: no Heat/Cold intolerance: no  Medication Monitoring Compliance:  Taking synthroid, on a new brand   Last TSH:   Lab Results  Component Value Date   TSH 3.38 02/22/2017      Social History   Tobacco Use  Smoking Status Never Smoker  Smokeless Tobacco Never Used  Tobacco Comment   tobacco use - no      ROS see history of present illness  Objective  Physical Exam Vitals:   06/14/17 1335  BP: 140/72  Pulse: 81  Temp: 98.6 F (37 C)  SpO2: 97%    BP Readings from Last 3 Encounters:  06/14/17 140/72  04/04/17 (!) 155/76  02/22/17 130/70   Wt Readings from Last 3 Encounters:  06/14/17 208 lb 9.6 oz (94.6 kg)  04/04/17 207 lb (93.9 kg)  02/22/17 204 lb 6.4 oz (92.7 kg)    Physical Exam  Constitutional: No distress.  Cardiovascular: Normal rate, regular rhythm and normal heart sounds.  Pulmonary/Chest: Effort normal and breath sounds normal.  Musculoskeletal: She exhibits no edema.    Right thumb at the interphalangeal joint appears to be slightly deviated, otherwise no visible deformities, no tenderness of the joints of the thumb or the dorsal aspect of the thumb, negative Finkelstein's, no anatomic snuffbox tenderness, no other joint tenderness or swelling in the right hand, no joint deformities or tenderness or swelling in the left hand, bilateral hands feel warm with good perfusion  Neurological: She is alert. Gait normal.  Skin: Skin is warm and dry. She is not diaphoretic.     Assessment/Plan: Please see individual problem list.  Hypothyroidism Has been stable.  We will plan on checking a TSH as planned in April given change in manufactures of her Synthroid.  Hyperlipidemia She will return for lab work in April to recheck cholesterol.  Continue Lipitor.  She will work on diet and exercise.  We will get her set up with nutrition in [place].  Arthralgia Suspect osteoarthritis as a possible cause.  She also possibly had de Quervain's tenosynovitis that seems somewhat improved based on the note from the walk-in clinic.  She will continue the thumb spica splint.  We will request the x-ray results.  Discussed icing.  Could consider referral to orthopedics if she continues to have issues.   Orders Placed This Encounter  Procedures  . LDL cholesterol, direct    Standing Status:   Future  Standing Expiration Date:   06/15/2018  . Comp Met (CMET)    Standing Status:   Future    Standing Expiration Date:   06/15/2018    No orders of the defined types were placed in this encounter.    Tommi Rumps, MD [place]

## 2017-06-15 NOTE — Progress Notes (Signed)
Patient states she will come by to sign release, release placed at front desk

## 2017-06-17 DIAGNOSIS — M25551 Pain in right hip: Secondary | ICD-10-CM | POA: Diagnosis not present

## 2017-06-28 ENCOUNTER — Other Ambulatory Visit (INDEPENDENT_AMBULATORY_CARE_PROVIDER_SITE_OTHER): Payer: Medicare Other

## 2017-06-28 DIAGNOSIS — E785 Hyperlipidemia, unspecified: Secondary | ICD-10-CM | POA: Diagnosis not present

## 2017-06-28 DIAGNOSIS — E039 Hypothyroidism, unspecified: Secondary | ICD-10-CM

## 2017-06-28 LAB — COMPREHENSIVE METABOLIC PANEL
ALT: 29 U/L (ref 0–35)
AST: 30 U/L (ref 0–37)
Albumin: 4.3 g/dL (ref 3.5–5.2)
Alkaline Phosphatase: 85 U/L (ref 39–117)
BUN: 13 mg/dL (ref 6–23)
CO2: 26 mEq/L (ref 19–32)
Calcium: 9.2 mg/dL (ref 8.4–10.5)
Chloride: 106 mEq/L (ref 96–112)
Creatinine, Ser: 0.8 mg/dL (ref 0.40–1.20)
GFR: 74.55 mL/min (ref >60.00)
Glucose, Bld: 92 mg/dL (ref 70–99)
Potassium: 4.2 mEq/L (ref 3.5–5.1)
Sodium: 140 mEq/L (ref 135–145)
Total Bilirubin: 1 mg/dL (ref 0.2–1.2)
Total Protein: 6.9 g/dL (ref 6.0–8.3)

## 2017-06-28 LAB — LDL CHOLESTEROL, DIRECT: Direct LDL: 45 mg/dL

## 2017-06-28 LAB — TSH: TSH: 1.91 u[IU]/mL (ref 0.35–4.50)

## 2017-06-30 ENCOUNTER — Telehealth: Payer: Self-pay | Admitting: Family Medicine

## 2017-06-30 NOTE — Telephone Encounter (Signed)
Copied from [place] [phone]. Topic: Quick Communication - See Telephone Encounter >> Jun 30, 2017  1:57 PM Vernona Rieger wrote: CRM for notification. See Telephone encounter for: 06/30/17   Please call patient for lab results @ [phone]

## 2017-06-30 NOTE — Telephone Encounter (Signed)
Notified patient of lab results. Patient verbalized understanding of results.

## 2017-08-09 ENCOUNTER — Encounter: Payer: Self-pay | Admitting: Family Medicine

## 2017-08-09 ENCOUNTER — Ambulatory Visit (INDEPENDENT_AMBULATORY_CARE_PROVIDER_SITE_OTHER): Payer: Medicare Other | Admitting: Family Medicine

## 2017-08-09 DIAGNOSIS — L409 Psoriasis, unspecified: Secondary | ICD-10-CM

## 2017-08-09 DIAGNOSIS — G8929 Other chronic pain: Secondary | ICD-10-CM

## 2017-08-09 DIAGNOSIS — E6609 Other obesity due to excess calories: Secondary | ICD-10-CM

## 2017-08-09 DIAGNOSIS — I10 Essential (primary) hypertension: Secondary | ICD-10-CM | POA: Diagnosis not present

## 2017-08-09 DIAGNOSIS — M5440 Lumbago with sciatica, unspecified side: Secondary | ICD-10-CM | POA: Diagnosis not present

## 2017-08-09 DIAGNOSIS — M654 Radial styloid tenosynovitis [de Quervain]: Secondary | ICD-10-CM | POA: Diagnosis not present

## 2017-08-09 DIAGNOSIS — Z6834 Body mass index (BMI) 34.0-34.9, adult: Secondary | ICD-10-CM | POA: Diagnosis not present

## 2017-08-09 NOTE — Assessment & Plan Note (Signed)
Has done well with increased dose of gabapentin.  She will continue to monitor this.

## 2017-08-09 NOTE — Assessment & Plan Note (Signed)
She follows with dermatology.  I discussed the potential risk of using mometasone on her face and advised not to get this anywhere near her eyes and to not use it in her ears.  She can use Debrox for the cerumen.

## 2017-08-09 NOTE — Assessment & Plan Note (Addendum)
Weight is stable.  She will continue to work on diet and exercise.  She is going to consider seeing nutrition.

## 2017-08-09 NOTE — Assessment & Plan Note (Signed)
Well-controlled.  Continue current regimen. 

## 2017-08-09 NOTE — Assessment & Plan Note (Signed)
Improved.  She can discontinue the brace.  If recurs she will let us know.

## 2017-08-09 NOTE — Progress Notes (Signed)
Tommi Rumps, MD Phone: [phone]  Alexandra Bishop is a 72 y.o. female who presents today for f/u.  CC: obesity, wrist pain, HTN  HYPERTENSION  Disease Monitoring  Home BP Monitoring not checking Chest pain- no    Dyspnea- no Medications  Compliance-  Taking amlodipine.  Edema- no  Patient notes her right wrist is a little bit better.  She wonders if there is some arthritis that was contributing.  She continues to wear the splint.  She continues on gabapentin for lumbar spinal stenosis.  Notes she takes 1 pill during the morning 1 pill in the middle of the day and 2 pills at night.  No excessive drowsiness with this.  It does help with her symptoms.  She has cut back on ibuprofen.  Fasting a few times a week though not back to back days.  Her diet is very healthy.  She is walking a couple times a week.  She is doing exercises from physical therapy.  She did not end up seeing nutrition.  She wonders about using mometasone cream in her bilateral ears to keep them lubricated.  Does follow with dermatology for rosacea and psoriasis.  She notes she occasionally uses the mometasone on her face.  She wonders about a dry spot near her left lower eyelid that she thought may be a stye.  She has been using warm washcloth and it has been improving.  No vision changes  Social History   Tobacco Use  Smoking Status Never Smoker  Smokeless Tobacco Never Used  Tobacco Comment   tobacco use - no      ROS see history of present illness  Objective  Physical Exam Vitals:   08/09/17 1413  BP: 110/68  Pulse: 78  Temp: 98 F (36.7 C)  SpO2: 97%    BP Readings from Last 3 Encounters:  08/09/17 110/68  06/14/17 140/72  04/04/17 (!) 155/76   Wt Readings from Last 3 Encounters:  08/09/17 208 lb 3.2 oz (94.4 kg)  06/14/17 208 lb 9.6 oz (94.6 kg)  04/04/17 207 lb (93.9 kg)    Physical Exam  Constitutional: No distress.  HENT:  Slight cerumen in bilateral ear canals  Eyes:     Cardiovascular: Normal rate, regular rhythm and normal heart sounds.  Pulmonary/Chest: Effort normal and breath sounds normal.  Musculoskeletal: She exhibits no edema.  Right wrist with no tenderness, no anatomic snuffbox tenderness, 5/5 right grip strength, negative Finkelstein's  Neurological: She is alert.  Skin: Skin is warm and dry. She is not diaphoretic.     Assessment/Plan: Please see individual problem list.  Obesity Weight is stable.  She will continue to work on diet and exercise.  She is going to consider seeing nutrition.  Hypertension Well-controlled.  Continue current regimen.  Psoriasis She follows with dermatology.  I discussed the potential risk of using mometasone on her face and advised not to get this anywhere near her eyes and to not use it in her ears.  She can use Debrox for the cerumen.  Chronic low back pain Has done well with increased dose of gabapentin.  She will continue to monitor this.  De Quervain's tenosynovitis, right Improved.  She can discontinue the brace.  If recurs she will let us know.  She will continue the warm compress for the possible dry skin.  If she develops a stye she will let us know and continue warm compresses.  No orders of the defined types were placed in this encounter.  No orders of the defined types were placed in this encounter.    Tommi Rumps, MD [place]

## 2017-08-09 NOTE — Patient Instructions (Addendum)
Nice to see you. You can discontinue the splint.  If the discomfort comes back you could ice and resume the splint and let us know. Please continue work on diet and exercise. Please do not use the mometasone in your ears or near your eyes.  You can use Debrox.

## 2017-08-11 ENCOUNTER — Ambulatory Visit: Payer: Medicare Other | Admitting: Family Medicine

## 2017-08-11 ENCOUNTER — Telehealth: Payer: Self-pay | Admitting: Family Medicine

## 2017-08-11 NOTE — Telephone Encounter (Signed)
I requested results from [facility] walk in, placed in blue folder

## 2017-08-11 NOTE — Telephone Encounter (Signed)
Pt dropped off a copy of her xrays from [facility] that she wants Dr. Caryl Bis to see. DVD is up front in Dr. Ellen Henri color folder.

## 2017-08-12 NOTE — Telephone Encounter (Signed)
X-ray interpretation reviewed.  It appears that she does have arthritis in her thumb as well as her index finger and middle finger.  That certainly could have contributed to her prior symptoms.

## 2017-08-12 NOTE — Telephone Encounter (Signed)
Patient notified

## 2017-08-12 NOTE — Telephone Encounter (Signed)
Patient notified, patient would like to know if there is anything in her wrist?

## 2017-08-12 NOTE — Telephone Encounter (Signed)
It does not look like they included her wrist in the x-ray.

## 2017-08-23 ENCOUNTER — Ambulatory Visit: Payer: Medicare Other | Admitting: Family Medicine

## 2017-08-24 ENCOUNTER — Other Ambulatory Visit: Payer: Self-pay | Admitting: Family Medicine

## 2017-08-24 DIAGNOSIS — M1990 Unspecified osteoarthritis, unspecified site: Secondary | ICD-10-CM

## 2017-08-25 NOTE — Telephone Encounter (Signed)
Last OV 08/09/17 last filled 01/05/17 [id number]

## 2017-09-13 ENCOUNTER — Ambulatory Visit: Payer: Medicare Other | Admitting: Family Medicine

## 2017-09-13 DIAGNOSIS — H0015 Chalazion left lower eyelid: Secondary | ICD-10-CM | POA: Diagnosis not present

## 2017-09-28 DIAGNOSIS — L400 Psoriasis vulgaris: Secondary | ICD-10-CM | POA: Diagnosis not present

## 2017-09-28 DIAGNOSIS — L821 Other seborrheic keratosis: Secondary | ICD-10-CM | POA: Diagnosis not present

## 2017-09-30 ENCOUNTER — Telehealth (INDEPENDENT_AMBULATORY_CARE_PROVIDER_SITE_OTHER): Payer: Self-pay

## 2017-09-30 NOTE — Telephone Encounter (Signed)
Patient called yesterday and stated that she is "developing" an allergy to her compression stockings.   She also said that she is going to stop wearing them and she needs some instructions on how to proceed without wearing the compression stockings daily?

## 2017-10-18 ENCOUNTER — Encounter: Payer: Self-pay | Admitting: Family Medicine

## 2017-10-18 ENCOUNTER — Ambulatory Visit (INDEPENDENT_AMBULATORY_CARE_PROVIDER_SITE_OTHER): Payer: Medicare Other | Admitting: Family Medicine

## 2017-10-18 DIAGNOSIS — R21 Rash and other nonspecific skin eruption: Secondary | ICD-10-CM

## 2017-10-18 MED ORDER — NYSTATIN 100000 UNIT/GM EX CREA: 1.0000 "application " | TOPICAL_CREAM | Freq: Two times a day (BID) | CUTANEOUS | 0 refills | Status: DC

## 2017-10-18 NOTE — Patient Instructions (Signed)
It was a pleasure to meet you today!  Please use cream two times a day and monitor for improvement. It is important to keep area clean and dry as moisture can be a factor that makes this rash worse.  If symptoms do not improve with treatment, follow up with your dermatology provider at your appointment or sooner with Dr. Caryl Bis if needed.   Rash A rash is a change in the color of the skin. A rash can also change the way your skin feels. There are many different conditions and factors that can cause a rash. Follow these instructions at home: Pay attention to any changes in your symptoms. Follow these instructions to help with your condition: Medicine Take or apply over-the-counter and prescription medicines only as told by your doctor. These may include:  Corticosteroid cream.  Anti-itch lotions.  Oral antihistamines.  Skin Care  Put cool compresses on the affected areas.  Try taking a bath with: ? Epsom salts. Follow the instructions on the packaging. You can get these at your local pharmacy or grocery store. ? Baking soda. Pour a small amount into the bath as told by your doctor. ? Colloidal oatmeal. Follow the instructions on the packaging. You can get this at your local pharmacy or grocery store.  Try putting baking soda paste onto your skin. Stir water into baking soda until it gets like a paste.  Do not scratch or rub your skin.  Avoid covering the rash. Make sure the rash is exposed to air as much as possible. General instructions  Avoid hot showers or baths, which can make itching worse. A cold shower may help.  Avoid scented soaps, detergents, and perfumes. Use gentle soaps, detergents, perfumes, and other cosmetic products.  Avoid anything that causes your rash. Keep a journal to help track what causes your rash. Write down: ? What you eat. ? What cosmetic products you use. ? What you drink. ? What you wear. This includes jewelry.  Keep all follow-up visits  as told by your doctor. This is important. Contact a doctor if:  You sweat at night.  You lose weight.  You pee (urinate) more than normal.  You feel weak.  You throw up (vomit).  Your skin or the whites of your eyes look yellow (jaundice).  Your skin: ? Tingles. ? Is numb.  Your rash: ? Does not go away after a few days. ? Gets worse.  You are: ? More thirsty than normal. ? More tired than normal.  You have: ? New symptoms. ? Pain in your belly (abdomen). ? A fever. ? Watery poop (diarrhea). Get help right away if:  Your rash covers all or most of your body. The rash may or may not be painful.  You have blisters that: ? Are on top of the rash. ? Grow larger. ? Grow together. ? Are painful. ? Are inside your nose or mouth.  You have a rash that: ? Looks like purple pinprick-sized spots all over your body. ? Has a "bull's eye" or looks like a target. ? Is red and painful, causes your skin to peel, and is not from being in the sun too long. This information is not intended to replace advice given to you by your health care provider. Make sure you discuss any questions you have with your health care provider. Document Released: 08/25/2007 Document Revised: 08/14/2015 Document Reviewed: 07/24/2014 Elsevier Interactive Patient Education  2018 [employer].    Skin Yeast Infection Skin yeast infection  is a condition in which there is an overgrowth of yeast (candida) that normally lives on the skin. This condition usually occurs in areas of the skin that are constantly warm and moist, such as the armpits or the groin. What are the causes? This condition is caused by a change in the normal balance of the yeast and bacteria that live on the skin. What increases the risk? This condition is more likely to develop in:  People who are obese.  Pregnant women.  Women who take birth control pills.  People who have diabetes.  People who take antibiotic  medicines.  People who take steroid medicines.  People who are malnourished.  People who have a weak defense (immune) system.  People who are 50 years of age or older.  What are the signs or symptoms? Symptoms of this condition include:  A red, swollen area of the skin.  Bumps on the skin.  Itchiness.  How is this diagnosed? This condition is diagnosed with a medical history and physical exam. Your health care provider may check for yeast by taking light scrapings of the skin to be viewed under a microscope. How is this treated? This condition is treated with medicine. Medicines may be prescribed or be available over-the-counter. The medicines may be:  Taken by mouth (orally).  Applied as a cream.  Follow these instructions at home:  Take or apply over-the-counter and prescription medicines only as told by your health care provider.  Eat more yogurt. This may help to keep your yeast infection from returning.  Maintain a healthy weight. If you need help losing weight, talk with your health care provider.  Keep your skin clean and dry.  If you have diabetes, keep your blood sugar under control. Contact a health care provider if:  Your symptoms go away and then return.  Your symptoms do not get better with treatment.  Your symptoms get worse.  Your rash spreads.  You have a fever or chills.  You have new symptoms.  You have new warmth or redness of your skin. This information is not intended to replace advice given to you by your health care provider. Make sure you discuss any questions you have with your health care provider. Document Released: 11/24/2010 Document Revised: 11/02/2015 Document Reviewed: 09/09/2014 Elsevier Interactive Patient Education  [employer].

## 2017-10-18 NOTE — Progress Notes (Signed)
Patient ID: Alexandra Bishop, female   DOB: 10-Nov-1945, 72 y.o.   MRN: [id number]   PCP: Leone Haven, MD  Subjective:  Alexandra Bishop is a 72 y.o. year old very pleasant female patient who presents with a Rash: Initial distribution: Right side that started 1 one ago  Prior history of this rash: No Discomfort associated: No Associated symptoms:  No Change in rash: Treatment with clobetasol once or twice daily has provided some  benefit for erythema. Denies:  Fever, chills, sweats, myalgias, difficulty swallowing, hoarseness, SOB, N/V, abdominal pain, or tightening throat. No new exposures of soaps, lotions, laundry detergent, fabric softeners, foods, medications, herbal supplements, STDs, plants, animals, or insects.  -started: one week ago, symptoms are not improving only erythema improvement -previous treatments: Clobetasol  -sick contacts/travel/risks: denies flu exposure.  -Hx of: allergies  ROS-denies fever, SOB, NVD,   Pertinent Past Medical History- HTN, Chronic venous insufficiency, Rosacea, Candidal intertrigo  Medications- reviewed  Current Outpatient Medications  Medication Sig Dispense Refill  . amLODipine (NORVASC) 10 MG tablet Take 1 tablet (10 mg total) by mouth daily. 90 tablet 3  . aspirin EC 81 MG tablet Take 81 mg by mouth every other day.    Marland Kitchen atorvastatin (LIPITOR) 20 MG tablet Take 1 tablet (20 mg total) by mouth daily. 90 tablet 3  . CALCIUM-MAGNESIUM-ZINC PO Take by mouth. Twice weekly    . clobetasol cream (TEMOVATE) 0.05 % Apply topically.    . clotrimazole (LOTRIMIN) 1 % cream Apply topically.    . cyanocobalamin 500 MCG tablet Take 500 mcg by mouth once a week.    . fluocinonide (LIDEX) 0.05 % external solution Apply topically.    . gabapentin (NEURONTIN) 100 MG capsule TAKE 1 CAPSULE (100 MG TOTAL) BY MOUTH 4 (FOUR) TIMES DAILY. 360 capsule 2  . hydrocortisone cream 1 % Apply topically.    Marland Kitchen levothyroxine (SYNTHROID, LEVOTHROID) 100 MCG tablet Take 1  tablet (100 mcg total) by mouth daily. 90 tablet 1  . Multiple Vitamin (MULTIVITAMIN) tablet Take 1 tablet by mouth every other day.    . nystatin cream (MYCOSTATIN) Apply 1 application topically 2 (two) times daily. 30 g 0  . pimecrolimus (ELIDEL) 1 % cream Apply topically 2 (two) times daily.    . Potassium Gluconate 595 MG CAPS Take by mouth every other day.    . Probiotic Product (PROBIOTIC PO) Take by mouth once a week.     No current facility-administered medications for this visit.     Objective: BP 118/78 (BP Location: Left Arm, Patient Position: Sitting, Cuff Size: Large)   Pulse 64   Temp 98.6 F (37 C) (Oral)   Resp 16   Wt 205 lb 8 oz (93.2 kg)   SpO2 98%   BMI 34.20 kg/m  Gen: NAD, resting comfortably HEENT: Oropharynx is clear and moist CV: RRR no murmurs rubs or gallops Lungs: CTAB no crackles, wheeze, rhonchi Abdomen: soft/nontender/nondistended/normal bowel sounds. No rebound or guarding.  Ext: no edema Skin: warm, dry,  erythematous patch in a skin fold just under bra line that has erythematous satellite papules Neuro: grossly normal, moves all extremities  Assessment/Plan:  Rash History and exam today are suggestive of candida intertrigo. Symptomatic treatment with: nystatin cream BID. We discussed that candida most often is pruritic in nature however with continued use of clobetasol, pruritus may have been controlled but rash has not resolved as antifungal is likely needed. If no improvement, she will follow up with dermatology. She has a  scheduled appointment with them for "skin testing" Finally, we reviewed reasons to return to care including if symptoms worsen or persist or new concerns arise- once again particularly rash that does not improve,  shortness of breath, N/V, or fever. Further advised patient to keep a diary of food choices and also use dye/scent free products.  Reviewed importance of calling 911immediately if symptoms of SOB, difficulty swallowing,  or throat tightening.   Laurita Quint, FNP

## 2017-10-19 ENCOUNTER — Telehealth: Payer: Self-pay

## 2017-10-19 NOTE — Telephone Encounter (Signed)
Patient is aware 

## 2017-10-19 NOTE — Telephone Encounter (Signed)
Dr Iver Nestle. She is at the [facility].

## 2017-10-19 NOTE — Telephone Encounter (Signed)
Copied from [place] [id number] [id number]. Topic: General - Other >> Oct 19, 2017 10:40 AM Keene Breath wrote: Reason for CRM: Patient called to request the name of a nutritionist in [place].  She states that Dr. Caryl Bis had given her a name a while ago, but cannot remember the name.  Please advise.  CB# [phone].

## 2017-10-19 NOTE — Telephone Encounter (Signed)
Patient needing name of nutritionist that you recommend.

## 2017-10-27 ENCOUNTER — Ambulatory Visit (INDEPENDENT_AMBULATORY_CARE_PROVIDER_SITE_OTHER): Payer: Medicare Other | Admitting: Family Medicine

## 2017-10-27 VITALS — Wt 205.6 lb

## 2017-10-27 DIAGNOSIS — Z713 Dietary counseling and surveillance: Secondary | ICD-10-CM | POA: Diagnosis not present

## 2017-10-27 NOTE — Patient Instructions (Addendum)
Recommend 1000u Vit D per day Consider tart cherry extract for inflammation  GOALS 1.  Keep Food diary 2. Goal is to walk to the end of the block 6 days per week and do her back exercises 76min 6days per week. 3.  Read and think about the anti-inflammatory pyramid to see what works for you    1. Eat at least REAL 3 meals and 1-2 snacks per day. Never go more than 4-5 hours while awake without eating. Eat breakfast within the first hour of getting up.   2. Obtain 2-3 times the volume of veg's as protein foods for both lunch and dinner.   - Fresh or frozen veg's are best.   - Keep frozen veg's on hand for a quick vegetable serving.

## 2017-10-27 NOTE — Progress Notes (Addendum)
Nutrition History  Ht: 5'6" Wt: 205.6  Learning Readiness:   Change in progress  Usual eating pattern includes 3 meals and 1 snacks per day. Frequent foods and beverages include fish, pork, chicken. Drinks Control and instrumentation engineer.  Usual physical activity includes ~46min x6days stretch. ~3days of waling 1 block  24-hr recall: (Up at  AM) 6ish am B (7 AM)- banana, green tea w/ art sugar    Snk ( AM)- none remembered, couple of green teas   L (noon)- fruit smoothie w/ protein powder (berries)   Snk ( PM)- none remembers   D ( 7PM)- left over pork(1)/onions/asparagus/sweet potoes and apples 3/4c w/ maple syrup 1tbsp, water  Snk ( PM)- none remembered  Typical day? Yes.      Handouts given during visit include:  After-Visit Summary (AVS)  Demonstrated degree of understanding via:  Teach Back

## 2017-11-03 ENCOUNTER — Encounter (INDEPENDENT_AMBULATORY_CARE_PROVIDER_SITE_OTHER): Payer: Self-pay | Admitting: Vascular Surgery

## 2017-11-03 ENCOUNTER — Ambulatory Visit (INDEPENDENT_AMBULATORY_CARE_PROVIDER_SITE_OTHER): Payer: Medicare Other | Admitting: Vascular Surgery

## 2017-11-03 VITALS — BP 199/78 | HR 79 | Resp 16 | Ht 65.0 in | Wt 204.8 lb

## 2017-11-03 DIAGNOSIS — I872 Venous insufficiency (chronic) (peripheral): Secondary | ICD-10-CM

## 2017-11-03 DIAGNOSIS — I10 Essential (primary) hypertension: Secondary | ICD-10-CM | POA: Diagnosis not present

## 2017-11-03 DIAGNOSIS — E039 Hypothyroidism, unspecified: Secondary | ICD-10-CM

## 2017-11-03 DIAGNOSIS — I890 Lymphedema, not elsewhere classified: Secondary | ICD-10-CM

## 2017-11-08 DIAGNOSIS — Z23 Encounter for immunization: Secondary | ICD-10-CM | POA: Diagnosis not present

## 2017-11-09 ENCOUNTER — Ambulatory Visit (INDEPENDENT_AMBULATORY_CARE_PROVIDER_SITE_OTHER): Payer: Medicare Other | Admitting: Family Medicine

## 2017-11-09 ENCOUNTER — Encounter: Payer: Self-pay | Admitting: Family Medicine

## 2017-11-09 VITALS — BP 124/78 | HR 61 | Temp 98.1°F | Ht 65.0 in | Wt 206.2 lb

## 2017-11-09 DIAGNOSIS — L409 Psoriasis, unspecified: Secondary | ICD-10-CM

## 2017-11-09 DIAGNOSIS — E6609 Other obesity due to excess calories: Secondary | ICD-10-CM | POA: Diagnosis not present

## 2017-11-09 DIAGNOSIS — M25571 Pain in right ankle and joints of right foot: Secondary | ICD-10-CM | POA: Diagnosis not present

## 2017-11-09 DIAGNOSIS — G8929 Other chronic pain: Secondary | ICD-10-CM

## 2017-11-09 DIAGNOSIS — I10 Essential (primary) hypertension: Secondary | ICD-10-CM | POA: Diagnosis not present

## 2017-11-09 DIAGNOSIS — Z6834 Body mass index (BMI) 34.0-34.9, adult: Secondary | ICD-10-CM

## 2017-11-09 DIAGNOSIS — M25531 Pain in right wrist: Secondary | ICD-10-CM | POA: Diagnosis not present

## 2017-11-09 HISTORY — DX: Other chronic pain: G89.29

## 2017-11-09 NOTE — Patient Instructions (Signed)
Nice to see you. We will get you to see sports medicine for your wrist and ankle. Please see your dermatologist for your scalp.

## 2017-11-09 NOTE — Assessment & Plan Note (Signed)
Well-controlled.  Continue current regimen. 

## 2017-11-09 NOTE — Progress Notes (Signed)
  Tommi Rumps, MD Phone: [phone]  Alexandra Bishop is a 72 y.o. female who presents today for f/u.  CC: Retention, wrist nodule, scalp issue, right ankle pain, obesity  Hypertension: Taking amlodipine.  No chest pain or shortness of breath.  Patient reports she has had a bump come up over the volar aspect of her right wrist towards the radial side.  This started about a month ago.  Feels like it might be a cyst.  It slightly uncomfortable.  She reports issues with her scalp being itchy and flaky possibly related to psoriasis.  She does follow with dermatology and has a prescription shampoo and liquid that she uses.  Patient reports right ankle achiness that has been going on for some time now.  She does have a history of ankle fracture with hardware in place though that was in 2000.  She notes no recent injury.  She is not taking any medications for this.  She has been wearing sandals a lot.  Obesity: She has not been doing much with her diet though is tracking what she eats.  She walks about 10 minutes a day.  She did see the nutritionist.  Social History   Tobacco Use  Smoking Status Never Smoker  Smokeless Tobacco Never Used  Tobacco Comment   tobacco use - no      ROS see history of present illness  Objective  Physical Exam Vitals:   11/09/17 1559  BP: 124/78  Pulse: 61  Temp: 98.1 F (36.7 C)  SpO2: 94%    BP Readings from Last 3 Encounters:  11/09/17 124/78  11/03/17 (!) 199/78  10/18/17 118/78   Wt Readings from Last 3 Encounters:  11/09/17 206 lb 3.2 oz (93.5 kg)  11/03/17 204 lb 12.8 oz (92.9 kg)  10/27/17 205 lb 9.6 oz (93.3 kg)    Physical Exam  Constitutional: No distress.  Cardiovascular: Normal rate, regular rhythm and normal heart sounds.  Pulmonary/Chest: Effort normal and breath sounds normal.  Musculoskeletal: She exhibits no edema.       Arms: Right ankle with no bony tenderness, no noted edema, 2+ DP pulse  Neurological: She is  alert.  Skin: Skin is warm and dry. She is not diaphoretic.  Slight flaking of scalp    Assessment/Plan: Please see individual problem list.  Hypertension Well-controlled.  Continue current regimen.  Psoriasis Possibly leading to her scalp issues.  She will see her dermatologist.  Right wrist pain Possible ganglion cyst.  Refer to sports medicine.  Chronic pain of right ankle Potentially arthritic in nature.  Discussed wearing more supportive shoes.  We will have her evaluated by sports medicine.  Obesity Encouraged increased activity.  She will watch her diet.    Orders Placed This Encounter  Procedures  . Ambulatory referral to Sports Medicine    Referral Priority:   Routine    Referral Type:   Consultation    Number of Visits Requested:   1    No orders of the defined types were placed in this encounter.    Tommi Rumps, MD [place]

## 2017-11-09 NOTE — Assessment & Plan Note (Signed)
Encouraged increased activity.  She will watch her diet.

## 2017-11-09 NOTE — Assessment & Plan Note (Signed)
Possible ganglion cyst.  Refer to sports medicine.

## 2017-11-09 NOTE — Assessment & Plan Note (Addendum)
Potentially arthritic in nature.  Discussed wearing more supportive shoes.  We will have her evaluated by sports medicine.

## 2017-11-09 NOTE — Assessment & Plan Note (Signed)
Possibly leading to her scalp issues.  She will see her dermatologist.

## 2017-11-10 NOTE — Telephone Encounter (Signed)
error 

## 2017-11-12 ENCOUNTER — Encounter (INDEPENDENT_AMBULATORY_CARE_PROVIDER_SITE_OTHER): Payer: Self-pay | Admitting: Vascular Surgery

## 2017-11-12 NOTE — Progress Notes (Signed)
MRN : [id number]  Alexandra Bishop is a 72 y.o. ([birth year]) female who presents with chief complaint of  Chief Complaint  Patient presents with  . Follow-up    pt is able to wear compression stockings  .  History of Present Illness:   The patient returns to the office for followup evaluation regarding leg swelling.  The swelling has improved quite a bit and the pain associated with swelling has decreased substantially. There have not been any interval development of a ulcerations or wounds.  Since the previous visit the patient has been wearing graduated compression stockings and has noted little significant improvement in the lymphedema. The patient has been using compression routinely morning until night.  The patient also states elevation during the day and exercise is being done too.     Current Meds  Medication Sig  . amLODipine (NORVASC) 10 MG tablet Take 1 tablet (10 mg total) by mouth daily.  Marland Kitchen aspirin EC 81 MG tablet Take 81 mg by mouth every other day.  Marland Kitchen atorvastatin (LIPITOR) 20 MG tablet Take 1 tablet (20 mg total) by mouth daily.  Marland Kitchen CALCIUM-MAGNESIUM-ZINC PO Take by mouth. Twice weekly  . clobetasol cream (TEMOVATE) 0.05 % Apply topically.  . clotrimazole (LOTRIMIN) 1 % cream Apply topically.  . cyanocobalamin 500 MCG tablet Take 500 mcg by mouth once a week.  . fluocinonide (LIDEX) 0.05 % external solution Apply topically.  . gabapentin (NEURONTIN) 100 MG capsule TAKE 1 CAPSULE (100 MG TOTAL) BY MOUTH 4 (FOUR) TIMES DAILY.  . hydrocortisone cream 1 % Apply topically.  Marland Kitchen levothyroxine (SYNTHROID, LEVOTHROID) 100 MCG tablet Take 1 tablet (100 mcg total) by mouth daily.  . Multiple Vitamin (MULTIVITAMIN) tablet Take 1 tablet by mouth every other day.  . nystatin cream (MYCOSTATIN) Apply 1 application topically 2 (two) times daily.  . pimecrolimus (ELIDEL) 1 % cream Apply topically 2 (two) times daily.  . Potassium Gluconate 595 MG CAPS Take by mouth every other day.      Past Medical History:  Diagnosis Date  . Hypothyroidism     Past Surgical History:  Procedure Laterality Date  . BREAST BIOPSY Right    neg  . broken ankle - RT    . BUNIONECTOMY  2015    Social History Social History   Tobacco Use  . Smoking status: Never Smoker  . Smokeless tobacco: Never Used  . Tobacco comment: tobacco use - no   Substance Use Topics  . Alcohol use: No  . Drug use: No    Family History Family History  Problem Relation Age of Onset  . Cancer Mother        sarcoma  . Heart disease Brother   . Cancer Maternal Grandmother        breast  . Breast cancer Maternal Grandmother   . Cancer Paternal Grandmother        breast  . Cancer Unknown        family hx  . Thyroid disease Unknown        family hx  . Thyroid cancer Neg Hx     Allergies  Allergen Reactions  . Cymbalta [Duloxetine Hcl]     Dizziness  . Duloxetine     Other reaction(s): Unknown Dizziness  . Hydrocodone     Feels Jittery  . Shellfish Allergy     Other reaction(s): Vomiting  . Tramadol     Feels jittery     REVIEW OF SYSTEMS (Negative unless checked)  Constitutional: [] Weight loss  [] Fever  [] Chills Cardiac: [] Chest pain   [] Chest pressure   [] Palpitations   [] Shortness of breath when laying flat   [] Shortness of breath with exertion. Vascular:  [] Pain in legs with walking   [] Pain in legs at rest  [] History of DVT   [] Phlebitis   [x] Swelling in legs   [] Varicose veins   [] Non-healing ulcers Pulmonary:   [] Uses home oxygen   [] Productive cough   [] Hemoptysis   [] Wheeze  [] COPD   [] Asthma Neurologic:  [] Dizziness   [] Seizures   [] History of stroke   [] History of TIA  [] Aphasia   [] Vissual changes   [] Weakness or numbness in arm   [] Weakness or numbness in leg Musculoskeletal:   [] Joint swelling   [] Joint pain   [] Low back pain Hematologic:  [] Easy bruising  [] Easy bleeding   [] Hypercoagulable state   [] Anemic Gastrointestinal:  [] Diarrhea   [] Vomiting   [] Gastroesophageal reflux/heartburn   [] Difficulty swallowing. Genitourinary:  [] Chronic kidney disease   [] Difficult urination  [] Frequent urination   [] Blood in urine Skin:  [] Rashes   [] Ulcers  Psychological:  [] History of anxiety   []  History of major depression.  Physical Examination  Vitals:   11/03/17 1129  BP: (!) 199/78  Pulse: 79  Resp: 16  Weight: 204 lb 12.8 oz (92.9 kg)  Height: 5\' 5"  (1.651 m)   Body mass index is 34.08 kg/m. Gen: WD/WN, NAD Head: [place]/AT, No temporalis wasting.  Ear/Nose/Throat: Hearing grossly intact, nares w/o erythema or drainage Eyes: PER, EOMI, sclera nonicteric.  Neck: Supple, no large masses.   Pulmonary:  Good air movement, no audible wheezing bilaterally, no use of accessory muscles.  Cardiac: RRR, no JVD Vascular: scattered varicosities present bilaterally.  Mild venous stasis changes to the legs bilaterally.  2-3+ soft pitting edema Vessel Right Left  Radial Palpable Palpable  PT Palpable Palpable  DP Palpable Palpable  Gastrointestinal: Non-distended. No guarding/no peritoneal signs.  Musculoskeletal: M/S 5/5 throughout.  No deformity or atrophy.  Neurologic: CN 2-12 intact. Symmetrical.  Speech is fluent. Motor exam as listed above. Psychiatric: Judgment intact, Mood & affect appropriate for pt's clinical situation. Dermatologic: Venous rashes no ulcers noted.  No changes consistent with cellulitis. Lymph : No lichenification or skin changes of chronic lymphedema.  CBC Lab Results  Component Value Date   WBC 7.2 11/23/2016   HGB 15.6 (H) 11/23/2016   HCT 47.9 (H) 11/23/2016   MCV 84.8 11/23/2016   PLT 271.0 11/23/2016    BMET    Component Value Date/Time   NA 140 06/28/2017 1015   K 4.2 06/28/2017 1015   CL 106 06/28/2017 1015   CO2 26 06/28/2017 1015   GLUCOSE 92 06/28/2017 1015   BUN 13 06/28/2017 1015   CREATININE 0.80 06/28/2017 1015   CALCIUM 9.2 06/28/2017 1015   CrCl cannot be calculated (Patient's most recent  lab result is older than the maximum 21 days allowed.).  COAG No results found for: INR, PROTIME  Radiology No results found.  Assessment/Plan 1. Lymphedema No surgery or intervention at this point in time.  I have reviewed my discussion with the patient regarding venous insufficiency and why it causes symptoms. I have discussed with the patient the chronic skin changes that accompany venous insufficiency and the long term sequela such as ulceration. Patient will contnue wearing graduated compression stockings on a daily basis, as this has provided excellent control of his edema. The patient will put the stockings on first thing in the  morning and removing them in the evening. The patient is reminded not to sleep in the stockings.  In addition, behavioral modification including elevation during the day will be initiated. Exercise is strongly encouraged.  Given the patient's good control and lack of any problems regarding the venous insufficiency and lymphedema a lymph pump in not need at this time.  The patient will follow up with me PRN should anything change.  The patient voices agreement with this plan.   2. Chronic venous insufficiency No surgery or intervention at this point in time.    I have had a long discussion with the patient regarding venous insufficiency and why it  causes symptoms. I have discussed with the patient the chronic skin changes that accompany venous insufficiency and the long term sequela such as infection and ulceration.  Patient will begin wearing graduated compression stockings class 1 (20-30 mmHg) or compression wraps on a daily basis a prescription was given. The patient will put the stockings on first thing in the morning and removing them in the evening. The patient is instructed specifically not to sleep in the stockings.    In addition, behavioral modification including several periods of elevation of the lower extremities during the day will be continued.  I have demonstrated that proper elevation is a position with the ankles at heart level.  The patient is instructed to begin routine exercise, especially walking on a daily basis   3. Essential hypertension Continue antihypertensive medications as already ordered, these medications have been reviewed and there are no changes at this time.   4. Hypothyroidism, unspecified type Continue Synthroid as ordered and reviewed, no changes at this time     Hortencia Pilar, MD  11/12/2017 2:18 PM

## 2017-11-23 ENCOUNTER — Ambulatory Visit
Admission: RE | Admit: 2017-11-23 | Discharge: 2017-11-23 | Disposition: A | Discharge status: Home / Self Care | Payer: Medicare Other | Source: Ambulatory Visit | Attending: Sports Medicine | Admitting: Sports Medicine

## 2017-11-23 ENCOUNTER — Encounter: Payer: Self-pay | Admitting: Sports Medicine

## 2017-11-23 ENCOUNTER — Ambulatory Visit (INDEPENDENT_AMBULATORY_CARE_PROVIDER_SITE_OTHER): Payer: Medicare Other | Admitting: Sports Medicine

## 2017-11-23 VITALS — BP 142/63 | Ht 65.0 in | Wt 202.0 lb

## 2017-11-23 DIAGNOSIS — M19079 Primary osteoarthritis, unspecified ankle and foot: Secondary | ICD-10-CM | POA: Diagnosis not present

## 2017-11-23 DIAGNOSIS — R233 Spontaneous ecchymoses: Secondary | ICD-10-CM | POA: Diagnosis not present

## 2017-11-23 DIAGNOSIS — M19071 Primary osteoarthritis, right ankle and foot: Secondary | ICD-10-CM | POA: Diagnosis not present

## 2017-11-23 DIAGNOSIS — M67431 Ganglion, right wrist: Secondary | ICD-10-CM

## 2017-11-23 DIAGNOSIS — M19171 Post-traumatic osteoarthritis, right ankle and foot: Secondary | ICD-10-CM

## 2017-11-23 NOTE — Assessment & Plan Note (Signed)
-   reviewed xrays from 2016 demonstrating ORIF laterally and two medial screws with moderate tibiotalar arthritis. - repeat xrays of ankle 3v ordered - ankle sleeve given with home exercises that you may complete at home or in PT (currently in PT) - follow up in 4 weeks - will call with results - may benefit from cortisone injection if acutely painful

## 2017-11-23 NOTE — Patient Instructions (Signed)
Ankle pain: xray 3v of your ankle are ordered. Recommend compression sleeve, ankle exercises, and f/u in 4 weeks. Will call you with the results of your xray.  Ganglion cyst: I am recommending a compression sleeve if it feels good. I recommend against aspiration or injection due to the proximity of your radial artery. If becomes painful, will refer to hand surgery for consideration of removal.  Rash: I am unsure why you have this rash. I ordered blood work to be completed. I will call you with the results and I am recommending that you call your PCP to schedule an appointment for further work up.

## 2017-11-23 NOTE — Assessment & Plan Note (Signed)
-   unknown etiology - reviewed med list, none would cause this - PT/INR and CBC w/ diff ordered, will call with results - call PCP for follow up appointment and further work up if indicated

## 2017-11-23 NOTE — Progress Notes (Signed)
Alexandra Bishop - 72 y.o. female MRN [id number]  Date of birth: 1945-09-11   Chief complaint: R wrist pain, R ankle pain, new onset rash  SUBJECTIVE:    History of present illness: Alexandra Bishop is a 72 year old female who presents today with several orthopedic complaints.  Her primary complaint is right sided ankle pain.  This is a chronic issue for her.  Her pain began back in 2000 when she had an injury.  She had an open reduction internal fixation of a bimalleolar fracture.  She has a lateral plate and 2 screws on the medial side of her left ankle.  After her surgery, her acute pain did resolve however she has had a dull ache in the ankle for several years.  She localizes her pain to the anterior talar dome lateral and medial joint lines.  She states her pain is nonradiating in nature and sometimes wakes her up in the middle the night.  She states she has lost range of motion and her pain is also worse with prolonged walking or standing.  It improves with relative rest and anti-inflammatory medications.  Her surgery was originally done in [place] and she is requesting a repeat x-ray of her ankle.  Her last x-ray of her ankle was back in 2016 which demonstrated moderate tibiotalar arthritis and prior hardware in the normal position.  She also has had surgery on her fifth metatarsal base with 2 screws and an osteotomy performed.  Her next issue is right wrist discomfort.  She states that this is been present for approximately 6 months and is unchanged.  An x-ray was performed by her primary care physician which demonstrated arthritis at the radiocarpal joint as well as the base of the first [facility] joint and several DIP/PIP joints.  The patient states she has had a nodule on her volar aspect of her wrist for approximately 2 to 3 months.  It is not painful however she is curious on what this is.  She was given a volar splint for her wrist which worsened her symptoms.  Not putting pressure on her volar aspect of  her wrist does improve her symptoms.  Denies any significant numbness or tingling.  Denies any finger pain or elbow pain.  No weakness in grip strength.  She is right-handed.  Her last issue is a new onset rash on her bilateral lower extremities.  She states this started yesterday and seems to be progressing towards the knees.  The rash is described as several red dots and is worsening.  She denies new medications.  She denies any problems with bleeding or clotting.  Her legs are not painful.  She does work out in the garden a lot however she is unaware whether or not she has been stung by any animals.  Denies any fevers, chills or night sweats.  Is never had anything like this before.  She states it has been over a year since her last blood counts have been measured.   Review of systems:  10 point review of systems was performed and pertinent positives and negatives discussed above in the HPI   Past medical history: Hypertension, hyperlipidemia, hypocalcemia, hypothyroidism, chronic right ankle pain, chronic venous insufficiency  Past surgical history: Bunionectomy, breast biopsy, ORIF of the right ankle, ORIF of the right fifth metatarsal base  Family history: Breast cancer, heart disease, thyroid cancer  Social history: She denies smoking, she is retired and used to garden frequently Medications reviewed. Allergies reviewed.  OBJECTIVE:  Physical exam:  Vital signs are reviewed. BP (!) 142/63   Ht 5\' 5"  (1.651 m)   Wt 202 lb (91.6 kg)   BMI 33.61 kg/m   Gen.: Alert, oriented, appears stated age, in no apparent distress HEENT: Moist oral mucosa, no rashes in the oral cavity Respiratory: Normal respirations, able to speak in full sentences Cardiac: Regular rate, distal pulses 2+ Integumentary: Bilateral lower extremity petechial rash that is nonblanching Neurologic: nonfocal, sensation is intact to light touch L4-S1 Gait: normal without associated limp Psych: Normal affect, mood is  described as good Musculoskeletal: Inspection of the bilateral lower extremities demonstrates the rash as stated above.  Prior surgical scars are noted over the right ankle.  She has diffuse tenderness to palpation of the right ankle with no obvious swelling.  She has limited range of motion in ankle dorsiflexion and plantarflexion.  Strength testing is 5 out of 5 in ankle dorsiflexion and plantarflexion.  She has a negative anterior drawer.  No discomfort elicited with ankle inversion or eversion.  No proximal fibular head tenderness.  Inspection of the right wrist demonstrates a 0.5 cm x 0.5 cm mobile nodule within the flexor digitorum sheath.  It does move in conjunction with the sheath.  It is nontender to palpation.  It is in close proximity to the radial artery on the volar aspect of the wrist.  Wrist strength is 5 out of 5 in all ranges of motion.  Capillary refill time is less than 2 seconds.  Negative Finkelstein's test.    ASSESSMENT & PLAN: Post-traumatic arthritis of right ankle - reviewed xrays from 2016 demonstrating ORIF laterally and two medial screws with moderate tibiotalar arthritis. - repeat xrays of ankle 3v ordered - ankle sleeve given with home exercises that you may complete at home or in PT (currently in PT) - follow up in 4 weeks - will call with results - may benefit from cortisone injection if acutely painful  Petechial rash - unknown etiology - reviewed med list, none would cause this - PT/INR and CBC w/ diff ordered, will call with results - call PCP for follow up appointment and further work up if indicated  Ganglion cyst of volar aspect of right wrist - discontinue volar wrist splint as it appears to be irritating - recommend compression sleeve if it feels better - conservative management with observation. If increases in size or becomes painful, then should have a f/u appointment - Recommend against aspiration as it is in close proximity to radial artery -  referral to hand surgery if symptomatic for consideration of removal of cyst   Orders Placed This Encounter  Procedures  . DG Ankle 2 Views Right    Standing Status:   Future    Standing Expiration Date:   01/24/2019    Order Specific Question:   Reason for Exam (SYMPTOM  OR DIAGNOSIS REQUIRED)    Answer:   right ankle pain    Order Specific Question:   Preferred imaging location?    Answer:   [facility]    Order Specific Question:   Radiology Contrast Protocol - do NOT remove file path    Answer:   [email or web address]  . CBC w/Diff  . INR/PT    Clydene Laming, DO Sports Medicine Fellow [facility]

## 2017-11-23 NOTE — Assessment & Plan Note (Signed)
-   discontinue volar wrist splint as it appears to be irritating - recommend compression sleeve if it feels better - conservative management with observation. If increases in size or becomes painful, then should have a f/u appointment - Recommend against aspiration as it is in close proximity to radial artery - referral to hand surgery if symptomatic for consideration of removal of cyst

## 2017-11-24 DIAGNOSIS — R233 Spontaneous ecchymoses: Secondary | ICD-10-CM | POA: Diagnosis not present

## 2017-11-25 LAB — CBC WITH DIFFERENTIAL/PLATELET
Basophils Absolute: 0.1 10*3/uL (ref 0.0–0.2)
Basos: 1 %
EOS (ABSOLUTE): 0.3 10*3/uL (ref 0.0–0.4)
Eos: 4 %
Hematocrit: 49.1 % — ABNORMAL HIGH (ref 34.0–46.6)
Hemoglobin: 15.6 g/dL (ref 11.1–15.9)
Immature Grans (Abs): 0 10*3/uL (ref 0.0–0.1)
Immature Granulocytes: 0 %
Lymphocytes Absolute: 1.3 10*3/uL (ref 0.7–3.1)
Lymphs: 19 %
MCH: 27.2 pg (ref 26.6–33.0)
MCHC: 31.8 g/dL (ref 31.5–35.7)
MCV: 86 fL (ref 79–97)
Monocytes Absolute: 0.6 10*3/uL (ref 0.1–0.9)
Monocytes: 9 %
Neutrophils Absolute: 4.6 10*3/uL (ref 1.4–7.0)
Neutrophils: 67 %
Platelets: 271 10*3/uL (ref 150–450)
RBC: 5.74 x10E6/uL — ABNORMAL HIGH (ref 3.77–5.28)
RDW: 12.3 % (ref 12.3–15.4)
WBC: 6.8 10*3/uL (ref 3.4–10.8)

## 2017-11-25 LAB — PROTIME-INR
INR: 1 (ref 0.8–1.2)
Prothrombin Time: 10.4 s (ref 9.1–12.0)

## 2017-11-28 ENCOUNTER — Telehealth: Payer: Self-pay | Admitting: Family Medicine

## 2017-11-28 NOTE — Telephone Encounter (Signed)
Called Pt to tell her that once the Dr review her lab results someone will call her with the results. Also her provider is out of the office, but another Dr will review her labs.

## 2017-11-28 NOTE — Telephone Encounter (Signed)
Pt came in wanting to get lab results from last week. Please advise?  Call pt @ [phone]. Thank you!

## 2017-11-28 NOTE — Telephone Encounter (Signed)
Disregard where it was last sent too.

## 2017-11-28 NOTE — Telephone Encounter (Signed)
Call pt  She had labs drawn 9/5  from Dr Lunette Stands so he would need to intrepret those for patient since he examined her.   [place] saw her 8/21 however didn't draw labs

## 2017-11-28 NOTE — Telephone Encounter (Signed)
Called patient and advised her to contact ordering physician in regards to lab Dr Lunette Stands. She verbalized understanding.

## 2017-12-13 DIAGNOSIS — Z01419 Encounter for gynecological examination (general) (routine) without abnormal findings: Secondary | ICD-10-CM | POA: Diagnosis not present

## 2017-12-13 DIAGNOSIS — L900 Lichen sclerosus et atrophicus: Secondary | ICD-10-CM | POA: Diagnosis not present

## 2017-12-13 DIAGNOSIS — Z1211 Encounter for screening for malignant neoplasm of colon: Secondary | ICD-10-CM | POA: Diagnosis not present

## 2017-12-20 ENCOUNTER — Telehealth (INDEPENDENT_AMBULATORY_CARE_PROVIDER_SITE_OTHER): Payer: Self-pay

## 2017-12-20 NOTE — Telephone Encounter (Signed)
Patient call to see if she could get advice as to wether or not she should use the ankle compression type sock or just go with the prescribed sock?

## 2017-12-20 NOTE — Telephone Encounter (Signed)
Called the patient back to inform her of what Maudie Mercury has advised. The patient proceeds to say that she thinks that she is allergic to the material in the compression socks that have been prescribed and that they cause pitting under her knees. She admits to not elevating as instructed, and agrees to do mor elevating with the Helix compression wraps that have been prescribed by her PT/doctor at another clinic. She does admit that her ankles are really swollen at the end of each day.

## 2017-12-20 NOTE — Telephone Encounter (Signed)
I recommend calf length as prescribed. The patient was encouraged to wear graduated compression stockings (20-30 mmHg) on a daily basis. The patient was instructed to begin wearing the stockings first thing in the morning and removing them in the evening. The patient was instructed specifically not to sleep in the stockings. Prescription was given.  In addition, behavioral modification including elevation during the day will be initiated. Anti-inflammatories for pain.

## 2017-12-21 ENCOUNTER — Ambulatory Visit (INDEPENDENT_AMBULATORY_CARE_PROVIDER_SITE_OTHER): Payer: Medicare Other | Admitting: Sports Medicine

## 2017-12-21 ENCOUNTER — Encounter: Payer: Self-pay | Admitting: Sports Medicine

## 2017-12-21 VITALS — BP 139/76 | Ht 65.0 in | Wt 202.0 lb

## 2017-12-21 DIAGNOSIS — M67431 Ganglion, right wrist: Secondary | ICD-10-CM

## 2017-12-21 DIAGNOSIS — M19171 Post-traumatic osteoarthritis, right ankle and foot: Secondary | ICD-10-CM

## 2017-12-21 NOTE — Patient Instructions (Signed)
I am glad your R wrist is doing better. I am recommending the body helix wrist sleeve if it is comfortable. If your wrist starts to ache or be painful, I will see you back for a follow up visit to discuss possible hand surgery referral for the ganglion cyst.  For your ankle, you can wear your body helix ankle brace when you are walking or doing a lot of activity. When you are at home, I am recommending elevating your legs above the level of your heart (two pillows). I agree with the compression stockings especially when you are traveling given the lymphedema. Follow up will be as needed.

## 2017-12-21 NOTE — Progress Notes (Signed)
Eleri Ruben - 72 y.o. female MRN [id number]  Date of birth: 1945-08-25   Chief complaint: R wrist pain f/u, R ankle pain  SUBJECTIVE:    History of present illness: 72 year old female who presents today for follow-up of right ankle pain and right wrist pain.  Her last visit she was diagnosed with a ganglion cyst on the volar aspect of her right wrist.  This was diagnosed via ultrasound in the office.  Where we had left at last visit, she was going to do wrist mobilization exercises and take anti-inflammatories as needed for her pain or discomfort.  If her wrist becomes more bothersome, we discussed referral to hand surgery.  Today, she states her right wrist pain has improved since starting some anti-inflammatory medications.  The cyst on her wrist does not really bother her.  She intermittently feels a dull sensation or pain over the volar aspect of her wrist.  This goes away spontaneously.  She is wondering if there is a small sleeve that she could wear just as a layer of compression over this area.  Denies any mechanical symptoms of her wrist.  No loss of grip strength.  No numbness or tingling of the right upper extremity.  For her right ankle, she states she gets intermittent pain here.  Pain is rated mild in nature located primarily along the joint line on the right ankle.  She has a history of ORIF of the right ankle with chronic pain afterwards.  She also has significant tibiotalar arthritis in that joint which is posttraumatic.  The patient has been wearing a body helix sleeve and taking anti-inflammatory medicines which helps with her symptoms.  She states her pain has actually improved and she only gets intermittent swelling now.  She is concerned that her left leg was swelling on her after traveling on a plane.  This is been a chronic issue for her as she has been diagnosed with venous insufficiency.  She does not wear compression stockings although this was recommended to her.  Denies any  fevers, chills or night sweats.  No calf tenderness.  The patient also did have a petechial rash last visit which resolved spontaneously.  The blood work that was ordered from this office was negative for any concerning features of bleeding or clotting disorders.   Review of systems:  As stated above   Interval past medical history, surgical history, family history, and social history obtained and are unchanged.   Pertinent positive past medical history include: Chronic low back pain, lymphedema, ganglion cyst on the right volar wrist, posttraumatic arthritis of the right ankle, chronic venous insufficiency and osteopenia pertinent surgical history include ORIF of the right ankle  Medications reviewed and unchanged. Allergies reviewed and unchanged.  OBJECTIVE:  Physical exam: Vital signs are reviewed. BP 139/76   Ht 5\' 5"  (1.651 m)   Wt 202 lb (91.6 kg)   BMI 33.61 kg/m   Gen.: Alert, oriented, appears stated age, in no apparent distress Integumentary: No rashes  Neurologic:  Sensation is intact to light touch L4-S1 and C5-T1 on the right Gait: normal without associated limp  Musculoskeletal: Inspection of the right wrist demonstrates a mild swelling at the volar aspect of her wrist.  She has a palpable ganglion cyst at this area.  She has full range of motion in wrist flexion and extension.  Strength testing 5 out of 5 in wrist flexion and extension.  Capillary refill time is less than 2 seconds.  Negative Tinel's test.  Inspection of the right ankle demonstrates prior surgical scars and trace edema.  She has decreased range of motion ankle dorsiflexion plantarflexion.  Strength testing however is 5 out of 5 in ankle dorsiflexion plantarflexion.  She has a negative anterior drawer.  No discomfort with inversion eversion testing.  Dorsalis pedis pulses +2.  Inspection of the left ankle demonstrates mild to moderate swelling with trace edema.  She has known lymphedema in this leg.  Negative  calf tenderness.    ASSESSMENT & PLAN: Ganglion cyst of volar aspect of right wrist - body helix for the R wrist for comfort, patient to order if she desires however did not receive one today - NSAIDs as needed for pain and discomfort if able - if growing in size or becomes more painful, recommend follow up visit to discuss referral to hand surgery for removal  Post-traumatic arthritis of right ankle - pain has improved significantly - continue body helix ankle sleeve, take off and elevate the leg when not active above level of heart - agree with compression stockings for lymphedema control - NSAIDs as needed if able - if worsening, recommend follow up visit to discuss further options including possible intraarticular injection    Clydene Laming, [place]

## 2017-12-21 NOTE — Assessment & Plan Note (Signed)
-   pain has improved significantly - continue body helix ankle sleeve, take off and elevate the leg when not active above level of heart - agree with compression stockings for lymphedema control - NSAIDs as needed if able - if worsening, recommend follow up visit to discuss further options including possible intraarticular injection

## 2017-12-21 NOTE — Assessment & Plan Note (Signed)
-   body helix for the R wrist for comfort - NSAIDs as needed for pain and discomfort if able - if growing in size or becomes more painful, recommend follow up visit to discuss referral to hand surgery for removal

## 2017-12-22 ENCOUNTER — Encounter: Payer: Self-pay | Admitting: Family Medicine

## 2017-12-22 ENCOUNTER — Ambulatory Visit: Payer: Medicare Other | Admitting: Family Medicine

## 2017-12-22 ENCOUNTER — Ambulatory Visit (INDEPENDENT_AMBULATORY_CARE_PROVIDER_SITE_OTHER): Payer: Medicare Other | Admitting: Family Medicine

## 2017-12-22 VITALS — Ht 65.0 in | Wt 209.8 lb

## 2017-12-22 DIAGNOSIS — E6609 Other obesity due to excess calories: Secondary | ICD-10-CM

## 2017-12-22 DIAGNOSIS — Z6834 Body mass index (BMI) 34.0-34.9, adult: Secondary | ICD-10-CM

## 2017-12-22 NOTE — Progress Notes (Signed)
  Assessment:  Primary concerns today: Weight management.   Learning Readiness:   Change in progress  Usual eating pattern includes 3 meals and 0-2 snacks per day. Usual physical activity includes walking, PT (hand, feet, hip).  24-hr recall: (Up at  7 AM) B (8 AM)-  Shake ensure,  Snk ( AM)- No   L ( 12 PM)- Kale, mashed sweet potatoes, mushrooms, pulled pork, peach cobbler  Snk ( PM)- No  D ( 630 PM)- [place] Crazy ([place]), Taco salad (rice, salad, chicken, cheese, sour cream, vegetables, dressing)  Snk ( PM)- No   Typical day? No.    Handouts given during visit include:  AVS  Demonstrated degree of understanding via:  Teach Back  Barriers to learning/adherence to lifestyle change: Husband will undergo cancer therapy in the next few weeks  Monitoring/Evaluation:  Dietary intake, exercise, and body weight prn

## 2017-12-22 NOTE — Patient Instructions (Addendum)
Goals: 1-Follow the Paleo meal plan with a goal 1200 calories a day. Pay attention to your appetite, if you feel hungry add a snack to your plan. Please do not deny your hunger. Vegetables would be a good option.  2-Practice all rehab exercises, once or twice a day for 6 days a week. [place] it on [employer].  3-Walk 6 days a week to the end of the block and back~ 20 min.  Please mark it on your calendar as well.   4-Please update through out process.  Email: [email or web address] Phone number: [phone]

## 2018-01-11 ENCOUNTER — Ambulatory Visit (INDEPENDENT_AMBULATORY_CARE_PROVIDER_SITE_OTHER): Payer: Medicare Other

## 2018-01-11 VITALS — BP 132/72 | HR 75 | Temp 97.9°F | Resp 15 | Ht 65.0 in | Wt 207.8 lb

## 2018-01-11 DIAGNOSIS — Z0000 Encounter for general adult medical examination without abnormal findings: Secondary | ICD-10-CM

## 2018-01-11 NOTE — Patient Instructions (Addendum)
  Alexandra Bishop , Thank you for taking time to come for your Medicare Wellness Visit. I appreciate your ongoing commitment to your health goals. Please review the following plan we discussed and let me know if I can assist you in the future.   These are the goals we discussed: Goals      Patient Stated   . Increase physical activity (pt-stated)     Continue lumbar and wrist exercises Walk more for exercise       This is a list of the screening recommended for you and due dates:  Health Maintenance  Topic Date Due  . Mammogram  01/28/2019  . Colon Cancer Screening  05/10/2021  . Tetanus Vaccine  08/10/2025  . Flu Shot  Completed  . DEXA scan (bone density measurement)  Completed  .  Hepatitis C: One time screening is recommended by Center for Disease Control  (CDC) for  adults born from [birth year] through 1967.   Completed

## 2018-01-11 NOTE — Progress Notes (Signed)
Subjective:   [place]andra Bishop is a 72 y.o. female who presents for Medicare Annual (Subsequent) preventive examination.  Review of Systems:  No ROS.  Medicare Wellness Visit. Additional risk factors are reflected in the social history. Cardiac Risk Factors include: advanced age (>55mn, >>24women);hypertension;obesity (BMI >30kg/m2)     Objective:     Vitals: BP 132/72 (BP Location: Left Arm, Patient Position: Sitting, Cuff Size: Normal)   Pulse 75   Temp 97.9 F (36.6 C) (Oral)   Resp 15   Ht 5' 5"  (1.651 m)   Wt 207 lb 12.8 oz (94.3 kg)   SpO2 96%   BMI 34.58 kg/m   Body mass index is 34.58 kg/m.  Advanced Directives 01/11/2018 01/10/2017 09/13/2016 01/08/2016 01/07/2015  Does Patient Have a Medical Advance Directive? No No No No Yes  Type of Advance Directive - - - - HPress photographerLiving will  Does patient want to make changes to medical advance directive? Yes (MAU/Ambulatory/Procedural Areas - Information given) No - Patient declined - - No - Patient declined  Copy of Healthcare Power of Attorney in Chart? - - - - No - copy requested  Would patient like information on creating a medical advance directive? - - - Yes - EScientist, clinical (histocompatibility and immunogenetics)given -    Tobacco Social History   Tobacco Use  Smoking Status Never Smoker  Smokeless Tobacco Never Used  Tobacco Comment   tobacco use - no      Counseling given: Not Answered Comment: tobacco use - no    Clinical Intake:  Pre-visit preparation completed: Yes  Pain : No/denies pain     Nutritional Status: BMI > 30  Obese Diabetes: No  How often do you need to have someone help you when you read instructions, pamphlets, or other written materials from your doctor or pharmacy?: 1 - Never  Interpreter Needed?: No     Past Medical History:  Diagnosis Date  . Chronic pain of right ankle 11/09/2017  . Hypothyroidism    Past Surgical History:  Procedure Laterality Date  . BREAST BIOPSY Right    neg    . broken ankle - RT    . BUNIONECTOMY  2015   Family History  Problem Relation Age of Onset  . Cancer Mother        sarcoma  . Heart disease Brother   . Cancer Maternal Grandmother        breast  . Breast cancer Maternal Grandmother   . Cancer Paternal Grandmother        breast  . Cancer Unknown        family hx  . Thyroid disease Unknown        family hx  . Thyroid cancer Neg Hx    Social History   Socioeconomic History  . Marital status: Married    Spouse name: Not on file  . Number of children: Not on file  . Years of education: Not on file  . Highest education level: Not on file  Occupational History  . Not on file  Social Needs  . Financial resource strain: Not hard at all  . Food insecurity:    Worry: Never true    Inability: Never true  . Transportation needs:    Medical: No    Non-medical: No  Tobacco Use  . Smoking status: Never Smoker  . Smokeless tobacco: Never Used  . Tobacco comment: tobacco use - no   Substance and Sexual Activity  . Alcohol  use: No  . Drug use: No  . Sexual activity: Never  Lifestyle  . Physical activity:    Days per week: 5 days    Minutes per session: 20 min  . Stress: Not at all  Relationships  . Social connections:    Talks on phone: Not on file    Gets together: Not on file    Attends religious service: Not on file    Active member of club or organization: Not on file    Attends meetings of clubs or organizations: Not on file    Relationship status: Not on file  Other Topics Concern  . Not on file  Social History Narrative   From [place] originally. Followed daughter to [place] and son in [place].    Married; retired - office work; gets regular exercise - walks a mile most days. [place]. Husband and her are following Paleo diet.     Outpatient Encounter Medications as of 01/11/2018  Medication Sig  . amLODipine (NORVASC) 10 MG tablet Take 1 tablet (10 mg total) by mouth daily.  Marland Kitchen aspirin EC 81 MG  tablet Take 81 mg by mouth every other day.  Marland Kitchen atorvastatin (LIPITOR) 20 MG tablet Take 1 tablet (20 mg total) by mouth daily.  Marland Kitchen CALCIUM-MAGNESIUM-ZINC PO Take by mouth. Twice weekly  . clobetasol cream (TEMOVATE) 0.05 % Apply topically.  . clotrimazole (LOTRIMIN) 1 % cream Apply topically.  . cyanocobalamin 500 MCG tablet Take 500 mcg by mouth once a week.  . fluocinonide (LIDEX) 0.05 % external solution Apply topically.  . gabapentin (NEURONTIN) 100 MG capsule TAKE 1 CAPSULE (100 MG TOTAL) BY MOUTH 4 (FOUR) TIMES DAILY.  . hydrocortisone cream 1 % Apply topically.  Marland Kitchen levothyroxine (SYNTHROID, LEVOTHROID) 100 MCG tablet Take 1 tablet (100 mcg total) by mouth daily.  . Multiple Vitamin (MULTIVITAMIN) tablet Take 1 tablet by mouth every other day.  . nystatin cream (MYCOSTATIN) Apply 1 application topically 2 (two) times daily.  . pimecrolimus (ELIDEL) 1 % cream Apply topically 2 (two) times daily.  . Potassium Gluconate 595 MG CAPS Take by mouth every other day.  . ketoconazole (NIZORAL) 2 % shampoo    No facility-administered encounter medications on file as of 01/11/2018.     Activities of Daily Living In your present state of health, do you have any difficulty performing the following activities: 01/11/2018  Hearing? N  Vision? N  Difficulty concentrating or making decisions? N  Walking or climbing stairs? N  Dressing or bathing? N  Doing errands, shopping? N  Preparing Food and eating ? N  Using the Toilet? N  In the past six months, have you accidently leaked urine? N  Do you have problems with loss of bowel control? N  Managing your Medications? N  Managing your Finances? N  Housekeeping or managing your Housekeeping? N  Some recent data might be hidden    Patient Care Team: Leone Haven, MD as PCP - General (Family Medicine)    Assessment:   This is a routine wellness examination for [place]andra Bishop.  The goal of the wellness visit is to assist the patient how to close  the gaps in care and create a preventative care plan for the patient.   The roster of all physicians providing medical care to patient is listed in the Snapshot section of the chart.  Osteopenia. Taking calcium as appropriate/Osteoporosis risk reviewed.    Safety issues reviewed; Smoke and carbon monoxide detectors in the home. No  firearms in the home. Wears seatbelts when driving or riding with others. No violence in the home.  They do not have excessive sun exposure.  Discussed the need for sun protection: hats, long sleeves and the use of sunscreen if there is significant sun exposure.  Patient is alert, normal appearance, oriented to person/place/and time.  Correctly identified the president of the [place] and recalls of 2/3 words. Performs simple calculations and can read correct time from watch face.  Displays appropriate judgement.  No new identified risk were noted.  No failures at ADL's or IADL's.    BMI- discussed the importance of a healthy diet, water intake and the benefits of aerobic exercise.    24 hour diet recall: 1200 calorie diet.  Dental- every 4-6 months.  Eye- Visual acuity not assessed per patient preference since they have regular follow up with the ophthalmologist.    Sleep patterns- Sleeps fair through the night.   Health maintenance gaps- closed.  Patient Concerns: None at this time. Follow up with PCP as needed.  Exercise Activities and Dietary recommendations Current Exercise Habits: Home exercise routine, Type of exercise: walking, Time (Minutes): 20, Frequency (Times/Week): 5, Weekly Exercise (Minutes/Week): 100  Goals      Patient Stated   . Increase physical activity (pt-stated)     Continue lumbar and wrist exercises Walk more for exercise       Fall Risk Fall Risk  01/11/2018 01/10/2017 01/08/2016 12/24/2015 01/07/2015  Falls in the past year? No No No No Yes  Number falls in past yr: - - - - 1  Comment - - - - -  Injury with  Fall? - - - - Yes  Comment - - - - Left knee pain.  XRAY completed.  Risk for fall due to : - - - - -  Follow up - - - - Education provided;Falls prevention discussed   Depression Screen PHQ 2/9 Scores 01/11/2018 01/10/2017 01/08/2016 12/24/2015  PHQ - 2 Score 0 0 0 0  PHQ- 9 Score - 0 - -     Cognitive Function MMSE - Mini Mental State Exam 01/10/2017 01/08/2016 01/07/2015  Orientation to time 5 5 5   Orientation to Place 5 5 5   Registration 3 3 3   Attention/ Calculation 5 5 5   Recall 3 3 3   Language- name 2 objects 2 2 2   Language- repeat 1 1 1   Language- follow 3 step command 3 3 3   Language- read & follow direction 1 1 1   Write a sentence 1 1 1   Copy design 1 1 1   Total score 30 30 30      6CIT Screen 01/11/2018  What Year? 0 points  What month? 0 points  What time? 0 points  Count back from 20 0 points  Months in reverse 0 points  Repeat phrase 0 points  Total Score 0    Immunization History  Administered Date(s) Administered  . Influenza Split 01/04/2011, 11/24/2011, 12/13/2013  . Influenza, High Dose Seasonal PF 12/24/2016  . Influenza,inj,Quad PF,6+ Mos 11/12/2015  . Influenza-Unspecified 01/03/2013, 12/23/2014, 12/24/2016, 12/12/2017  . Pneumococcal Conjugate-13 12/03/2013, 10/18/2016  . Pneumococcal Polysaccharide-23 07/29/2009  . Tdap 01/12/2008, 08/11/2015  . Zoster 11/24/2011   Screening Tests Health Maintenance  Topic Date Due  . MAMMOGRAM  01/28/2019  . COLONOSCOPY  05/10/2021  . TETANUS/TDAP  08/10/2025  . INFLUENZA VACCINE  Completed  . DEXA SCAN  Completed  . Hepatitis C Screening  Completed      Plan:  End of life planning; Advance aging; Advanced directives discussed. Copy of current HCPOA/Living Will requested.    I have personally reviewed and noted the following in the patient's chart:   . Medical and social history . Use of alcohol, tobacco or illicit drugs  . Current medications and supplements . Functional ability and  status . Nutritional status . Physical activity . Advanced directives . List of other physicians . Hospitalizations, surgeries, and ER visits in previous 12 months . Vitals . Screenings to include cognitive, depression, and falls . Referrals and appointments  In addition, I have reviewed and discussed with patient certain preventive protocols, quality metrics, and best practice recommendations. A written personalized care plan for preventive services as well as general preventive health recommendations were provided to patient.     Varney Biles, LPN  [id number]/2094

## 2018-01-11 NOTE — Progress Notes (Signed)
I have reviewed the above note and agree.  Eric Sonnenberg, M.D.  

## 2018-01-17 ENCOUNTER — Telehealth: Payer: Self-pay | Admitting: Family Medicine

## 2018-01-17 DIAGNOSIS — E039 Hypothyroidism, unspecified: Secondary | ICD-10-CM

## 2018-01-17 DIAGNOSIS — I10 Essential (primary) hypertension: Secondary | ICD-10-CM

## 2018-01-17 DIAGNOSIS — E785 Hyperlipidemia, unspecified: Secondary | ICD-10-CM

## 2018-01-17 DIAGNOSIS — R7303 Prediabetes: Secondary | ICD-10-CM

## 2018-01-17 NOTE — Telephone Encounter (Signed)
-----   Message from Dia Crawford, LPN sent at 11/55/2080  3:27 PM EDT ----- Regarding: FUTURE LABS Pt scheduled in December with you late day 3:30.  Appears she is due for annual Lipid panel, LDL direct, A1c, TSH at this time.   Fasting lab appointment scheduled 1 week prior (just incase) you would like to discuss during this visit.   No labs have been ordered. Please place labs at your discretion. If no lab visit needed- let me know and I can cancel it.  Thanks, Denisa

## 2018-01-17 NOTE — Telephone Encounter (Signed)
Ordered.  Please let her know to keep the lab appointment.  Thanks.

## 2018-01-31 DIAGNOSIS — B372 Candidiasis of skin and nail: Secondary | ICD-10-CM | POA: Diagnosis not present

## 2018-01-31 DIAGNOSIS — L400 Psoriasis vulgaris: Secondary | ICD-10-CM | POA: Diagnosis not present

## 2018-01-31 DIAGNOSIS — L821 Other seborrheic keratosis: Secondary | ICD-10-CM | POA: Diagnosis not present

## 2018-01-31 DIAGNOSIS — L218 Other seborrheic dermatitis: Secondary | ICD-10-CM | POA: Diagnosis not present

## 2018-02-22 ENCOUNTER — Other Ambulatory Visit (INDEPENDENT_AMBULATORY_CARE_PROVIDER_SITE_OTHER): Payer: Medicare Other

## 2018-02-22 DIAGNOSIS — R7303 Prediabetes: Secondary | ICD-10-CM

## 2018-02-22 DIAGNOSIS — E785 Hyperlipidemia, unspecified: Secondary | ICD-10-CM

## 2018-02-22 DIAGNOSIS — E039 Hypothyroidism, unspecified: Secondary | ICD-10-CM

## 2018-02-22 DIAGNOSIS — I10 Essential (primary) hypertension: Secondary | ICD-10-CM | POA: Diagnosis not present

## 2018-02-22 LAB — COMPREHENSIVE METABOLIC PANEL
ALT: 29 U/L (ref 0–35)
AST: 28 U/L (ref 0–37)
Albumin: 4.4 g/dL (ref 3.5–5.2)
Alkaline Phosphatase: 87 U/L (ref 39–117)
BUN: 14 mg/dL (ref 6–23)
CO2: 26 mEq/L (ref 19–32)
Calcium: 9.4 mg/dL (ref 8.4–10.5)
Chloride: 106 mEq/L (ref 96–112)
Creatinine, Ser: 0.81 mg/dL (ref 0.40–1.20)
GFR: 73.36 mL/min (ref >60.00)
Glucose, Bld: 102 mg/dL — ABNORMAL HIGH (ref 70–99)
Potassium: 4.1 mEq/L (ref 3.5–5.1)
Sodium: 141 mEq/L (ref 135–145)
Total Bilirubin: 0.9 mg/dL (ref 0.2–1.2)
Total Protein: 6.8 g/dL (ref 6.0–8.3)

## 2018-02-22 LAB — LIPID PANEL
Cholesterol: 114 mg/dL (ref 0–200)
HDL: 55.4 mg/dL (ref >39.00)
LDL Cholesterol: 39 mg/dL (ref 0–99)
NonHDL: 58.9
Total CHOL/HDL Ratio: 2
Triglycerides: 99 mg/dL (ref 0.0–149.0)
VLDL: 19.8 mg/dL (ref 0.0–40.0)

## 2018-02-22 LAB — TSH: TSH: 2.44 u[IU]/mL (ref 0.35–4.50)

## 2018-02-22 LAB — HEMOGLOBIN A1C: Hgb A1c MFr Bld: 5.9 % (ref 4.6–6.5)

## 2018-02-23 DIAGNOSIS — R21 Rash and other nonspecific skin eruption: Secondary | ICD-10-CM | POA: Diagnosis not present

## 2018-03-01 ENCOUNTER — Encounter: Payer: Self-pay | Admitting: Family Medicine

## 2018-03-01 ENCOUNTER — Ambulatory Visit (INDEPENDENT_AMBULATORY_CARE_PROVIDER_SITE_OTHER): Payer: Medicare Other | Admitting: Family Medicine

## 2018-03-01 VITALS — BP 130/82 | HR 73 | Temp 98.1°F | Ht 65.0 in | Wt 211.6 lb

## 2018-03-01 DIAGNOSIS — N649 Disorder of breast, unspecified: Secondary | ICD-10-CM | POA: Diagnosis not present

## 2018-03-01 DIAGNOSIS — E785 Hyperlipidemia, unspecified: Secondary | ICD-10-CM

## 2018-03-01 DIAGNOSIS — L918 Other hypertrophic disorders of the skin: Secondary | ICD-10-CM | POA: Diagnosis not present

## 2018-03-01 DIAGNOSIS — R21 Rash and other nonspecific skin eruption: Secondary | ICD-10-CM

## 2018-03-01 DIAGNOSIS — R7303 Prediabetes: Secondary | ICD-10-CM

## 2018-03-01 MED ORDER — LEVOTHYROXINE SODIUM 100 MCG PO TABS: 100.0000 ug | ORAL_TABLET | Freq: Every day | ORAL | 1 refills | Status: DC

## 2018-03-01 NOTE — Patient Instructions (Signed)
Nice to see you. Please try the nystatin cream you have at home. Please try to work on diet and exercise. Please see dermatology as planned. We will get you set up for your mammogram.

## 2018-03-01 NOTE — Progress Notes (Signed)
Tommi Rumps, MD Phone: [phone]  [place] is a 72 y.o. female who presents today for f/u.  CC: Rash, prediabetes, hyperlipidemia, skin tags  Rash: Patient notes this has been going on for a number of weeks.  She saw her dermatologist initially and they prescribed fluconazole.  She has also been using a number of topical medications.  She notes rash in her left axilla, in the area lateral to her left breast, on the inferior portion of her right breast, and in her bilateral inguinal area.  She was seen at the walk-in clinic and prescribed doxycycline and topical Bactroban to try to cover for a bacterial cause.  She notes the area on her inferior right breast is slightly improved though the other areas persist.  She notes some itching in her scalp.  No purulent drainage.  She has not tried nystatin.  Prediabetes: No polyuria or polydipsia.  She is trying intermittent fasting.  She has not been walking for exercise.  Hyperlipidemia: Taking Lipitor.  No chest pain, right upper quadrant pain, or myalgias.  She does report a number of skin tags around her neck.  She also reports some puffiness of her eyelids.  Social History   Tobacco Use  Smoking Status Never Smoker  Smokeless Tobacco Never Used  Tobacco Comment   tobacco use - no      ROS see history of present illness  Objective  Physical Exam Vitals:   03/01/18 1549  BP: 130/82  Pulse: 73  Temp: 98.1 F (36.7 C)  SpO2: 97%    BP Readings from Last 3 Encounters:  03/01/18 130/82  01/11/18 132/72  12/21/17 139/76   Wt Readings from Last 3 Encounters:  03/01/18 211 lb 9.6 oz (96 kg)  01/11/18 207 lb 12.8 oz (94.3 kg)  12/22/17 209 lb 12.8 oz (95.2 kg)    Physical Exam Constitutional:      General: She is not in acute distress.    Appearance: She is not diaphoretic.  Eyes:     Conjunctiva/sclera: Conjunctivae normal.     Pupils: Pupils are equal, round, and reactive to light.  Cardiovascular:     Rate  and Rhythm: Normal rate and regular rhythm.     Heart sounds: Normal heart sounds.  Pulmonary:     Effort: Pulmonary effort is normal.     Breath sounds: Normal breath sounds.  Skin:    General: Skin is warm and dry.     Comments: Erythematous patches of skin in left axilla, lateral to left breast tissue, in inferior right breast tissue that has slightly faded with no palpable underlying mass, and in bilateral inguinal area, chaperone used, scattered skin tags on neck  Neurological:     Mental Status: She is alert.      Assessment/Plan: Please see individual problem list.  Breast skin lesion Patient with skin lesion on right breast and in left axilla.  I suspect this is unrelated to her breast lesion though she is due for a mammogram and we will change this to diagnostic to evaluate for underlying cause.  Rash Undetermined cause.  Discussed trial of topical nystatin which she reports she has at home.  She has follow-up with her dermatologist and she will discuss this with them.  She may benefit from a biopsy to determine a specific cause.  Hyperlipidemia Continue Lipitor.  Prediabetes Discussed diet and exercise.  Cutaneous skin tags She will see her dermatologist for this.  Discussed trial of Flonase or Claritin to see  if that would help with her puffy eyes given that she does note some allergy symptoms as well.  Orders Placed This Encounter  Procedures  . MM DIAG BREAST TOMO BILATERAL    Standing Status:   Future    Standing Expiration Date:   05/04/2019    Order Specific Question:   Reason for Exam (SYMPTOM  OR DIAGNOSIS REQUIRED)    Answer:   rash inferior right breast, left axilla, no palpable underlying abnormalities    Order Specific Question:   Preferred imaging location?    Answer:   [facility]  . US BREAST LTD UNI RIGHT INC AXILLA    Standing Status:   Future    Standing Expiration Date:   05/04/2019    Order Specific Question:   Reason for Exam (SYMPTOM   OR DIAGNOSIS REQUIRED)    Answer:   rash inferior right breast, left axilla, no palpable underlying abnormalities    Order Specific Question:   Preferred imaging location?    Answer:   [facility]  . US BREAST LTD UNI LEFT INC AXILLA    Standing Status:   Future    Standing Expiration Date:   05/04/2019    Order Specific Question:   Reason for Exam (SYMPTOM  OR DIAGNOSIS REQUIRED)    Answer:   rash inferior right breast, left axilla, no palpable underlying abnormalities    Order Specific Question:   Preferred imaging location?    Answer:   [facility] this encounter  Medications  . levothyroxine (SYNTHROID, LEVOTHROID) 100 MCG tablet    Sig: Take 1 tablet (100 mcg total) by mouth daily.    Dispense:  90 tablet    Refill:  1     Tommi Rumps, MD [place]

## 2018-03-02 DIAGNOSIS — R21 Rash and other nonspecific skin eruption: Secondary | ICD-10-CM | POA: Insufficient documentation

## 2018-03-02 DIAGNOSIS — R7303 Prediabetes: Secondary | ICD-10-CM | POA: Insufficient documentation

## 2018-03-02 DIAGNOSIS — L918 Other hypertrophic disorders of the skin: Secondary | ICD-10-CM | POA: Insufficient documentation

## 2018-03-02 DIAGNOSIS — N649 Disorder of breast, unspecified: Secondary | ICD-10-CM | POA: Insufficient documentation

## 2018-03-02 NOTE — Assessment & Plan Note (Signed)
Discussed diet and exercise 

## 2018-03-02 NOTE — Assessment & Plan Note (Signed)
Undetermined cause.  Discussed trial of topical nystatin which she reports she has at home.  She has follow-up with her dermatologist and she will discuss this with them.  She may benefit from a biopsy to determine a specific cause.

## 2018-03-02 NOTE — Assessment & Plan Note (Signed)
-  Continue Lipitor °

## 2018-03-02 NOTE — Assessment & Plan Note (Signed)
She will see her dermatologist for this.

## 2018-03-02 NOTE — Assessment & Plan Note (Signed)
Patient with skin lesion on right breast and in left axilla.  I suspect this is unrelated to her breast lesion though she is due for a mammogram and we will change this to diagnostic to evaluate for underlying cause.

## 2018-03-06 ENCOUNTER — Other Ambulatory Visit: Payer: Self-pay | Admitting: Family Medicine

## 2018-03-17 ENCOUNTER — Ambulatory Visit
Admission: RE | Admit: 2018-03-17 | Discharge: 2018-03-17 | Disposition: A | Discharge status: Home / Self Care | Payer: Medicare Other | Source: Ambulatory Visit | Attending: Family Medicine | Admitting: Family Medicine

## 2018-03-17 DIAGNOSIS — N649 Disorder of breast, unspecified: Secondary | ICD-10-CM

## 2018-03-17 DIAGNOSIS — R928 Other abnormal and inconclusive findings on diagnostic imaging of breast: Secondary | ICD-10-CM | POA: Diagnosis not present

## 2018-03-29 ENCOUNTER — Telehealth: Payer: Self-pay | Admitting: Family Medicine

## 2018-03-29 NOTE — Telephone Encounter (Signed)
TC from patient. Labs reviewed with patient. Stated she understood.

## 2018-04-04 DIAGNOSIS — J209 Acute bronchitis, unspecified: Secondary | ICD-10-CM | POA: Diagnosis not present

## 2018-04-06 DIAGNOSIS — L400 Psoriasis vulgaris: Secondary | ICD-10-CM | POA: Diagnosis not present

## 2018-04-06 DIAGNOSIS — L578 Other skin changes due to chronic exposure to nonionizing radiation: Secondary | ICD-10-CM | POA: Diagnosis not present

## 2018-04-06 DIAGNOSIS — L821 Other seborrheic keratosis: Secondary | ICD-10-CM | POA: Diagnosis not present

## 2018-04-06 DIAGNOSIS — B372 Candidiasis of skin and nail: Secondary | ICD-10-CM | POA: Diagnosis not present

## 2018-04-06 DIAGNOSIS — L218 Other seborrheic dermatitis: Secondary | ICD-10-CM | POA: Diagnosis not present

## 2018-04-06 DIAGNOSIS — Z1283 Encounter for screening for malignant neoplasm of skin: Secondary | ICD-10-CM | POA: Diagnosis not present

## 2018-04-06 DIAGNOSIS — L570 Actinic keratosis: Secondary | ICD-10-CM | POA: Diagnosis not present

## 2018-04-06 DIAGNOSIS — I781 Nevus, non-neoplastic: Secondary | ICD-10-CM | POA: Diagnosis not present

## 2018-04-11 DIAGNOSIS — J209 Acute bronchitis, unspecified: Secondary | ICD-10-CM | POA: Diagnosis not present

## 2018-04-22 ENCOUNTER — Other Ambulatory Visit: Payer: Self-pay | Admitting: Family Medicine

## 2018-05-08 ENCOUNTER — Encounter: Payer: Self-pay | Admitting: Family Medicine

## 2018-05-08 ENCOUNTER — Ambulatory Visit (INDEPENDENT_AMBULATORY_CARE_PROVIDER_SITE_OTHER): Payer: Medicare Other | Admitting: Family Medicine

## 2018-05-08 VITALS — BP 130/70 | HR 82 | Temp 98.2°F | Wt 210.4 lb

## 2018-05-08 DIAGNOSIS — G8929 Other chronic pain: Secondary | ICD-10-CM | POA: Diagnosis not present

## 2018-05-08 DIAGNOSIS — M654 Radial styloid tenosynovitis [de Quervain]: Secondary | ICD-10-CM | POA: Diagnosis not present

## 2018-05-08 DIAGNOSIS — L820 Inflamed seborrheic keratosis: Secondary | ICD-10-CM

## 2018-05-08 DIAGNOSIS — M25559 Pain in unspecified hip: Secondary | ICD-10-CM | POA: Diagnosis not present

## 2018-05-08 DIAGNOSIS — R21 Rash and other nonspecific skin eruption: Secondary | ICD-10-CM

## 2018-05-08 DIAGNOSIS — Z6834 Body mass index (BMI) 34.0-34.9, adult: Secondary | ICD-10-CM

## 2018-05-08 DIAGNOSIS — E6609 Other obesity due to excess calories: Secondary | ICD-10-CM | POA: Diagnosis not present

## 2018-05-08 NOTE — Progress Notes (Signed)
Tommi Rumps, MD Phone: [phone]  [place] is a 73 y.o. female who presents today for follow-up.  CC: Right wrist pain, chronic hip pain, skin issues, obesity  Right wrist pain: Patient notes this has improved quite a bit.  She has been wearing a copper fit glove that has helped.  No significant discomfort currently.  Chronic hip pain: She does note chronic hip pain that hurts if she sits for too long or too much.  She notes she is unsure if being active helps.  She has been doing some cycling.  Obesity: Patient cycling about twice a week on a recumbent bike.  She is going to change her exercise to the morning prior to eating.  She has done a [employer].  No polyuria or polydipsia.  Skin lesions: She notes the lesions on her breasts and in her axilla have improved significantly.  She has been using the topical creams prescribed by the dermatologist.  She would like a referral to a new dermatologist.  She does note a new skin lesion on her posterior left shoulder that has become irritated.  She also notes a little irritation underneath her right lateral lower eyelid that is been present just recently.  Social History   Tobacco Use  Smoking Status Never Smoker  Smokeless Tobacco Never Used  Tobacco Comment   tobacco use - no      ROS see history of present illness  Objective  Physical Exam Vitals:   05/08/18 1440  BP: 130/70  Pulse: 82  Temp: 98.2 F (36.8 C)  SpO2: 96%    BP Readings from Last 3 Encounters:  05/08/18 130/70  03/01/18 130/82  01/11/18 132/72   Wt Readings from Last 3 Encounters:  05/08/18 210 lb 6.4 oz (95.4 kg)  03/01/18 211 lb 9.6 oz (96 kg)  01/11/18 207 lb 12.8 oz (94.3 kg)    Physical Exam Constitutional:      General: She is not in acute distress.    Appearance: She is not diaphoretic.  Cardiovascular:     Rate and Rhythm: Normal rate and regular rhythm.     Heart sounds: Normal heart sounds.  Pulmonary:     Effort: Pulmonary  effort is normal.     Breath sounds: Normal breath sounds.  Genitourinary:    Comments: Chaperone used, skin lesions inferior to her breasts have resolved, no apparent skin lesion in her left axilla Musculoskeletal:     Right lower leg: No edema.     Left lower leg: No edema.     Comments: Right wrist nontender with no swelling, left wrist nontender with no swelling  Skin:    General: Skin is warm and dry.     Comments: Seborrheic keratosis over her left shoulder that appears irritated though no signs of infection  Neurological:     Mental Status: She is alert.      Assessment/Plan: Please see individual problem list.  Rash Resolved.  Monitor for recurrence.  Will refer to a new dermatologist.  Seborrheic keratoses, inflamed Refer to dermatology to consider removal.  Obesity Encouraged continued diet and exercise.  De Quervain's tenosynovitis, right Wrist pain has improved with use of copper fit glove.  She will continue this.  Chronic hip pain Maintain activity for likely OA.   Orders Placed This Encounter  Procedures  . Ambulatory referral to Dermatology    Referral Priority:   Routine    Referral Type:   Consultation    Referral Reason:  Specialty Services Required    Requested Specialty:   Dermatology    Number of Visits Requested:   1    No orders of the defined types were placed in this encounter.    Tommi Rumps, MD [place]

## 2018-05-08 NOTE — Patient Instructions (Addendum)
Nice to see you. We will refer you to [facility] dermatology for evaluation. Please monitor your wrist.  If it bothers you further please let us know. Please try to stay active and monitor your diet.  [address] [place], [place] [address]

## 2018-05-09 DIAGNOSIS — M25559 Pain in unspecified hip: Secondary | ICD-10-CM

## 2018-05-09 DIAGNOSIS — G8929 Other chronic pain: Secondary | ICD-10-CM | POA: Insufficient documentation

## 2018-05-09 NOTE — Assessment & Plan Note (Signed)
Wrist pain has improved with use of copper fit glove.  She will continue this.

## 2018-05-09 NOTE — Assessment & Plan Note (Signed)
Maintain activity for likely OA.

## 2018-05-09 NOTE — Assessment & Plan Note (Signed)
Resolved.  Monitor for recurrence.  Will refer to a new dermatologist.

## 2018-05-09 NOTE — Assessment & Plan Note (Signed)
Encouraged continued diet and exercise. 

## 2018-05-09 NOTE — Assessment & Plan Note (Signed)
Refer to dermatology to consider removal.

## 2018-05-17 ENCOUNTER — Other Ambulatory Visit: Payer: Self-pay | Admitting: Family Medicine

## 2018-05-29 DIAGNOSIS — L820 Inflamed seborrheic keratosis: Secondary | ICD-10-CM | POA: Diagnosis not present

## 2018-05-29 DIAGNOSIS — L218 Other seborrheic dermatitis: Secondary | ICD-10-CM | POA: Diagnosis not present

## 2018-06-26 ENCOUNTER — Other Ambulatory Visit: Payer: Self-pay | Admitting: Family Medicine

## 2018-06-26 DIAGNOSIS — M1990 Unspecified osteoarthritis, unspecified site: Secondary | ICD-10-CM

## 2018-08-22 ENCOUNTER — Other Ambulatory Visit: Payer: Self-pay | Admitting: Family Medicine

## 2018-08-28 ENCOUNTER — Other Ambulatory Visit: Payer: Self-pay | Admitting: Family Medicine

## 2018-09-05 ENCOUNTER — Other Ambulatory Visit: Payer: Self-pay

## 2018-09-05 ENCOUNTER — Ambulatory Visit: Payer: Self-pay | Admitting: Family Medicine

## 2018-09-05 NOTE — Telephone Encounter (Signed)
Pt calling with multiple concerns. States scalp itching last night, severe. States H/O, worsened.  Calling to ask Dr. Ellen Henri advise regarding stress, depression related to skin issues. States She "May be a little depressed."  Also questioning if Dr. Caryl Bis has any diet advise, especially  regarding 'Paleo Diet."   Pt is evasive historian difficult to follow conversation. Did states she would like a virtual appt to discuss these concerns. TN transferred call to practice, [facility], for consideration of virtual appt.  CAll was dropped,Karen states practice will CB.  CB# [phone] Reason for Disposition . [1] MODERATE-SEVERE local itching (i.e., interferes with work, school, activities) AND [2] not improved after 24 hours of hydrocortisone cream  Answer Assessment - Initial Assessment Questions 1. DESCRIPTION: "Describe the itching you are having." "Where is it located?"    Severe scalp 2. SEVERITY: "How bad is it?"    - MILD - doesn't interfere with normal activities   - MODERATE - SEVERE: interferes with work, school, sleep, or other activities      severe 3. SCRATCHING: "Are there any scratch marks? Bleeding?"     unsure 4. ONSET: "When did the itching begin?"      H/O Worsening last night 5. CAUSE: "What do you think is causing the itching?"     H/O 6. OTHER SYMPTOMS: "Do you have any other symptoms?"      Depression, stress  Protocols used: ITCHING - LOCALIZED-A-AH

## 2018-09-06 ENCOUNTER — Encounter: Payer: Self-pay | Admitting: Family Medicine

## 2018-09-06 ENCOUNTER — Ambulatory Visit (INDEPENDENT_AMBULATORY_CARE_PROVIDER_SITE_OTHER): Payer: Medicare Other | Admitting: Family Medicine

## 2018-09-06 VITALS — HR 83 | Temp 98.5°F | Resp 18 | Ht 65.0 in | Wt 206.0 lb

## 2018-09-06 DIAGNOSIS — Z713 Dietary counseling and surveillance: Secondary | ICD-10-CM

## 2018-09-06 DIAGNOSIS — L821 Other seborrheic keratosis: Secondary | ICD-10-CM

## 2018-09-06 DIAGNOSIS — F418 Other specified anxiety disorders: Secondary | ICD-10-CM | POA: Diagnosis not present

## 2018-09-06 HISTORY — DX: Other specified anxiety disorders: F41.8

## 2018-09-06 NOTE — Patient Instructions (Signed)
Mild soap brands: Cetaphil CeraVe Dove    Try meditation, devotionals as ways to help calm self and think positive things. Let me know if you decide you want counseling referral    Any diet is a guideline to help make healthier food choices and it meant to improve our health. Weight loss takes time and dedication to eating well and regular physical activity. You can do it!  This is  Dr. Lupita Dawn  example of a  "Low GI"  Diet:  It will allow you to lose 4 to 8  lbs  per month if you follow it carefully.  Your goal with exercise is a minimum of 30 minutes of aerobic exercise 5 days per week (Walking does not count once it becomes easy!)     All of the foods can be found at grocery stores and in bulk at [employer].  The Atkins protein bars and shakes are available in more varieties at [employer], [employer] and [employer].       7 AM Breakfast:  Choose from the following:             Low carbohydrate Protein  Shakes (I recommend the  Premier Protein chocolate shakes,  EAS AdvantEdge "Carb Control" shakes  Or the Atkins shakes all are under 3 net carbs)                           a scrambled egg/bacon/cheese burrito made with Mission's "carb balance" whole wheat tortilla  (about 10 net carbs )             Regulatory affairs officer (basically a quiche without the pastry crust) that is eaten cold and very convenient way to get your eggs.  8 carbs)             If you make your own protein shakes, avoid bananas and pineapple,  And use low carb greek yogurt or original /unsweetened almond or soy milk     Avoid cereal and bananas, oatmeal and cream of wheat and grits. They are loaded with carbohydrates!     10 AM: high protein snack:             Protein bar by Atkins (the snack size, under 200 cal, usually < 6 net carbs).               A stick of cheese:  Around 1 carb,  100 cal                Dannon Light n Fit [place] Yogurt  (80 cal, 8 carbs)  Other so called "protein bars" and Greek  yogurts tend to be loaded with carbohydrates.  Remember, in food advertising, the word "energy" is synonymous for " carbohydrate."   Lunch:              A Sandwich using the bread choices listed, Can use any  Eggs,  lunchmeat, grilled meat or canned tuna), avocado, regular mayo/mustard  and cheese.             A Salad using blue cheese, ranch,  Goddess or vinagrette,  Avoid taco shells, croutons or "confetti" and no "candied nuts" but regular nuts OK.              No pretzels, nabs  or chips.  Pickles and miniature sweet peppers are a good low carb alternative that provide a "crunch"  The bread is the  only source of carbohydrate in a sandwich and  can be decreased by trying some of the attached alternatives to traditional loaf bread              Avoid "Low fat dressings, as well as Cadiz dressings They are loaded with sugar!     3 PM/ Mid day  Snack:             Consider  1 ounce of  almonds, walnuts, pistachios, pecans, peanuts,  Macadamia nuts or a nut medley.  Avoid "granola and granola bars "  Mixed nuts are ok in moderation as long as there are no raisins,  cranberries or dried fruit.   KIND bars are OK if you get the low glycemic index variety   Try the prosciutto/mozzarella cheese sticks by Fior[employer]  In deli /backery section   High protein                 6 PM  Dinner:                Meat/fowl/fish with a green salad, and either broccoli, cauliflower, green beans, spinach, brussel sprouts or  Lima beans. DO NOT BREAD THE PROTEIN!!               There is a low carb pasta by Dreamfield's that is acceptable and tastes great: only 5 digestible carbs/serving.( All grocery stores but BJs carry it ) Several ready made meals are available low carb:              Try Michel Angelo's chicken piccata or chicken or eggplant parm over low carb pasta.(Lowes and BJs)              Marjory Lies Sanchez's "Carnitas" (pulled pork, no sauce,  0 carbs) or his beef pot roast to make a dinner  burrito (at [employer]J's)             Pesto over low carb pasta (bj's sells a good quality pesto in the center refrigerated section of the deli              Try satueeing  Cheral Marker with mushroooms as a good side              Green Giant makes a mashed cauliflower that tastes like mashed potatoes   Whole wheat pasta is still full of digestible carbs and  Not as low in glycemic index as Dreamfield's.   Brown rice is still rice,  So skip the rice and noodles if you eat [place] or [place] (or at least limit to 1/2 cup)   9 PM snack :              Breyer's "low carb" fudgsicle or  ice cream bar (Carb Smart line), or  Weight Watcher's ice cream bar , or another "no sugar added" ice cream;             a serving of fresh berries/cherries with whipped cream              Cheese or DANNON'S LlGHT N FIT GREEK YOGURT             8 ounces of [place] unsweetened almond/cococunut milk               Treat yourself to a parfait made with whipped cream blueberiies, walnuts and vanilla greek yogurt   Avoid bananas, pineapple, grapes  and watermelon on a regular basis because they  are high in sugar.  THINK OF THEM AS DESSERT   Remember that snack Substitutions should be less than 10 NET carbs per serving and meals < 20 carbs. Remember to subtract fiber grams to get the "net carbs."

## 2018-09-06 NOTE — Progress Notes (Signed)
Subjective:    Patient ID: Alexandra Bishop, female    DOB: 09-29-1945, 73 y.o.   MRN: [id number]  HPI   Patient presents to clinic due to his depression and anxiety that potentially could be affecting her itchy scalp.  Patient has had itchy scalp for years and follows with dermatology.  She has a known diagnosis of seborrheic keratosis and is on prescription strength antidandruff shampoo.  Patient states times where she feels more nervous and anxious, notices itching on scalp is worse.  Patient states she has felt more urinary and down recently.  States the pandemic has caused increased stress due to worry over what could happen and also states she sometimes has a tough time being a liberal more than or living in a more conservative [place].  States she worries about a lot of things that often are out of her control.  Denies any SI or HI.  Currently is not on any sort of antidepressant medication and never has taken anything like this.  Patient also concerned over her weight and would like to lose weight.  Patient states she tries to eat a healthy diet but does not often have success with weight loss.  Patient states she has tried different types of diets over the years, but never seems to be able to stick with anything.  Most recently she tried the paly O diet, but continues to eat bread.    Patient Active Problem List   Diagnosis Date Noted  . Chronic hip pain 05/09/2018  . Breast skin lesion 03/02/2018  . Rash 03/02/2018  . Prediabetes 03/02/2018  . Cutaneous skin tags 03/02/2018  . Post-traumatic arthritis of right ankle 11/23/2017  . Petechial rash 11/23/2017  . Ganglion cyst of volar aspect of right wrist 11/23/2017  . De Quervain's tenosynovitis, right 08/09/2017  . Lymphedema 09/15/2016  . Candidal intertrigo 11/12/2015  . Rosacea 06/12/2015  . Hemorrhoid 09/05/2014  . Osteopenia of thigh 03/29/2014  . Chronic low back pain 06/27/2013  . Psoriasis 11/30/2012  . Persistent  nodularity of breast 08/28/2012  . Insomnia 04/10/2012  . Postmenopausal estrogen deficiency 01/24/2012  . Chronic venous insufficiency 08/20/2011  . Seborrheic keratoses, inflamed 08/20/2011  . Obesity 04/14/2011  . Hypothyroidism 01/04/2011  . Hyperlipidemia 01/04/2011  . Hypertension 01/04/2011  . SUPRAVENTRICULAR PREMATURE BEATS 09/04/2009   Social History   Tobacco Use  . Smoking status: Never Smoker  . Smokeless tobacco: Never Used  . Tobacco comment: tobacco use - no   Substance Use Topics  . Alcohol use: No   Review of Systems   Constitutional: Negative for chills, fatigue and fever.  HENT: Negative for congestion, ear pain, sinus pain and sore throat.   Eyes: Negative.   Respiratory: Negative for cough, shortness of breath and wheezing.   Cardiovascular: Negative for chest pain, palpitations and leg swelling.  Gastrointestinal: Negative for abdominal pain, diarrhea, nausea and vomiting.  Genitourinary: Negative for dysuria, frequency and urgency.  Musculoskeletal: Negative for arthralgias and myalgias.  Skin: +itchy scalp Neurological: Negative for syncope, light-headedness and headaches.  Psychiatric/Behavioral: +anxiety, feeling weary/down at times.      Objective:   Physical Exam Vitals signs and nursing note reviewed.  Constitutional:      General: She is not in acute distress.    Appearance: She is not ill-appearing, toxic-appearing or diaphoretic.  HENT:     Head: Normocephalic and atraumatic.  Eyes:     General: No scleral icterus.    Extraocular Movements: Extraocular movements intact.  Conjunctiva/sclera: Conjunctivae normal.  Cardiovascular:     Rate and Rhythm: Normal rate and regular rhythm.  Pulmonary:     Effort: Pulmonary effort is normal. No respiratory distress.     Breath sounds: Normal breath sounds.  Skin:    General: Skin is warm and dry.     Coloration: Skin is not jaundiced or pale.     Comments: Skin on scalp appears normal,  no obvious redness of flaking  Neurological:     Mental Status: She is alert and oriented to person, place, and time.     Gait: Gait normal.  Psychiatric:        Attention and Perception: Attention normal.        Mood and Affect: Mood is anxious.        Speech: Speech normal.        Behavior: Behavior is cooperative.        Thought Content: Thought content normal.        Judgment: Judgment normal.     Comments: Worries a lot about different things. Trying to do her best     Vitals:   09/06/18 1129  Pulse: 83  Resp: 18  Temp: 98.5 F (36.9 C)  SpO2: 98%   Depression screen [facility] 2/9 09/06/2018 03/01/2018 01/11/2018 01/10/2017 01/08/2016  Decreased Interest 1 0 0 0 0  Down, Depressed, Hopeless 2 0 0 0 0  PHQ - 2 Score 3 0 0 0 0  Altered sleeping 0 - - 0 -  Tired, decreased energy 1 - - 0 -  Change in appetite 1 - - 0 -  Feeling bad or failure about yourself  1 - - 0 -  Trouble concentrating 0 - - 0 -  Moving slowly or fidgety/restless 0 - - 0 -  Suicidal thoughts 0 - - 0 -  PHQ-9 Score 6 - - 0 -  Difficult doing work/chores Somewhat difficult - - - -       Assessment & Plan:     A total of 50  minutes were spent face-to-face with the patient during this encounter and over half of that time was spent on counseling and coordination of care. The patient was counseled on strategies to help depression anxiety, possibility of counseling referral, brands of soap letter mild on skin, healthy diet and exercise.  Depression/anxiety- Long discussion with patient about anxiety and depression.  Discussed ways we can change her mindset to turn a positive into a negative for look at his situation from our perspective of something we can change to make it better rather than focusing on things that are out of her control.  Discussed option of referral to counseling, patient would prefer not to see a counselor at this time and will work on things like meditation, mindfulness and reading short  devotional's to help ease her mind.  Seborrheic keratosis-patient will keep regularly scheduled follow-up with dermatology as planned.  Advised to watch out for any sort of added scents or dyes in that soaps and shampoos as these can be irritating to the skin.  Discussed soap brands such as Cetaphil, CeraVe and Dove are mild on the skin.  Weight loss counseling- Long discussion with patient regards to weight loss counseling.  Advised that many diets are meant to be guidelines to achieve overall better nutrition and overall better physical health.  Advised the key to weight loss is eating less calories overall and moving more.  Discussed that a diet lower in carbohydrates tends  to help with weight loss due to lower carbohydrate foods often being lower calorie count.  Patient given handout outlining a dietary guidelines with different food ideas she can try to help jump start her weight loss journey.  Also advised patient that weight loss does not happen quickly, it takes many months to years and the dietary changes you make must be sustainable so weight loss can be maintained.  Patient will follow-up with PCP and August as already planned.  She will return to clinic sooner if any issues arise.  Advised to let me know if she decides she would like counseling referral

## 2018-09-25 DIAGNOSIS — L400 Psoriasis vulgaris: Secondary | ICD-10-CM | POA: Diagnosis not present

## 2018-09-25 DIAGNOSIS — B372 Candidiasis of skin and nail: Secondary | ICD-10-CM | POA: Diagnosis not present

## 2018-09-25 DIAGNOSIS — L821 Other seborrheic keratosis: Secondary | ICD-10-CM | POA: Diagnosis not present

## 2018-09-25 DIAGNOSIS — L218 Other seborrheic dermatitis: Secondary | ICD-10-CM | POA: Diagnosis not present

## 2018-09-29 ENCOUNTER — Telehealth: Payer: Self-pay | Admitting: Family Medicine

## 2018-09-29 DIAGNOSIS — F419 Anxiety disorder, unspecified: Secondary | ICD-10-CM

## 2018-09-29 NOTE — Telephone Encounter (Signed)
Patient is requesting a referral to a behavioral health specialist.  Patient stated she was suppose to speak with someone yesterday but I am not sure who she was referring too. Patient is requesting a call back Call back [phone]

## 2018-10-02 NOTE — Addendum Note (Signed)
Addended by: Leone Haven on: 10/02/2018 05:52 PM   Modules accepted: Orders

## 2018-10-02 NOTE — Telephone Encounter (Signed)
Patient is requesting a referral to behavior health, she feels leary, depression and wants to see the behavior specialist here at [facility].  Nina,cma

## 2018-10-02 NOTE — Telephone Encounter (Addendum)
Referral placed.

## 2018-10-02 NOTE — Telephone Encounter (Signed)
Called and spoke with patient, she is not having any suicidal thoughts at all,she is just a little anxious.  Azriel Dancy,cma

## 2018-10-02 NOTE — Telephone Encounter (Signed)
I am happy to place a referral for this, though please offer her a visit with me as well so we can discuss further. Please also confirm whether or not she is having any suicidal thoughts. Thanks.

## 2018-10-12 DIAGNOSIS — B372 Candidiasis of skin and nail: Secondary | ICD-10-CM | POA: Diagnosis not present

## 2018-10-12 DIAGNOSIS — N898 Other specified noninflammatory disorders of vagina: Secondary | ICD-10-CM | POA: Diagnosis not present

## 2018-10-20 ENCOUNTER — Ambulatory Visit (INDEPENDENT_AMBULATORY_CARE_PROVIDER_SITE_OTHER): Payer: Medicare Other | Admitting: Psychology

## 2018-10-20 DIAGNOSIS — F4320 Adjustment disorder, unspecified: Secondary | ICD-10-CM

## 2018-10-24 ENCOUNTER — Telehealth: Payer: Self-pay

## 2018-10-24 DIAGNOSIS — R7303 Prediabetes: Secondary | ICD-10-CM

## 2018-10-24 DIAGNOSIS — E039 Hypothyroidism, unspecified: Secondary | ICD-10-CM

## 2018-10-24 DIAGNOSIS — I10 Essential (primary) hypertension: Secondary | ICD-10-CM

## 2018-10-24 DIAGNOSIS — E785 Hyperlipidemia, unspecified: Secondary | ICD-10-CM

## 2018-10-24 NOTE — Telephone Encounter (Signed)
Patient called in wanting basic fasting lab panel orders placed for appointment for 8/17. Patient did talk to an MD and they gave her information, relating to weight and prediabetic, to share with PCP. Alexandra Bishop,cma

## 2018-10-24 NOTE — Telephone Encounter (Signed)
Copied from [place] [id number]. Topic: General - Inquiry >> Oct 24, 2018  8:40 AM Richardo Priest, NT wrote: Reason for CRM: Patient called in wanting basic fasting lab panel orders placed for appointment for 8/17. Patient did talk to an MD and they gave her information, relating to weight and prediabetic, to share with PCP. Please advise and call back is [phone].

## 2018-10-26 NOTE — Telephone Encounter (Signed)
Orders have been placed. She can be scheduled for labs prior to her appointment.

## 2018-10-26 NOTE — Addendum Note (Signed)
Addended by: Leone Haven on: 10/26/2018 09:09 AM   Modules accepted: Orders

## 2018-10-27 NOTE — Telephone Encounter (Signed)
I called and left a message to call back to schedule lab appt.  [place] for [employer] to advise.  Hall Birchard,cma

## 2018-10-27 NOTE — Telephone Encounter (Signed)
Pt returned call.  Please call pt to sched.

## 2018-11-01 ENCOUNTER — Other Ambulatory Visit: Payer: Self-pay

## 2018-11-01 ENCOUNTER — Other Ambulatory Visit (INDEPENDENT_AMBULATORY_CARE_PROVIDER_SITE_OTHER): Payer: Medicare Other

## 2018-11-01 DIAGNOSIS — E785 Hyperlipidemia, unspecified: Secondary | ICD-10-CM | POA: Diagnosis not present

## 2018-11-01 DIAGNOSIS — E039 Hypothyroidism, unspecified: Secondary | ICD-10-CM | POA: Diagnosis not present

## 2018-11-01 DIAGNOSIS — I10 Essential (primary) hypertension: Secondary | ICD-10-CM | POA: Diagnosis not present

## 2018-11-01 DIAGNOSIS — R7303 Prediabetes: Secondary | ICD-10-CM

## 2018-11-01 LAB — COMPREHENSIVE METABOLIC PANEL
ALT: 25 U/L (ref 0–35)
AST: 25 U/L (ref 0–37)
Albumin: 4.6 g/dL (ref 3.5–5.2)
Alkaline Phosphatase: 108 U/L (ref 39–117)
BUN: 11 mg/dL (ref 6–23)
CO2: 25 mEq/L (ref 19–32)
Calcium: 9.6 mg/dL (ref 8.4–10.5)
Chloride: 104 mEq/L (ref 96–112)
Creatinine, Ser: 0.81 mg/dL (ref 0.40–1.20)
GFR: 68.89 mL/min (ref >60.00)
Glucose, Bld: 96 mg/dL (ref 70–99)
Potassium: 4.1 mEq/L (ref 3.5–5.1)
Sodium: 141 mEq/L (ref 135–145)
Total Bilirubin: 1 mg/dL (ref 0.2–1.2)
Total Protein: 6.8 g/dL (ref 6.0–8.3)

## 2018-11-01 LAB — HEMOGLOBIN A1C: Hgb A1c MFr Bld: 6 % (ref 4.6–6.5)

## 2018-11-01 LAB — TSH: TSH: 1.88 u[IU]/mL (ref 0.35–4.50)

## 2018-11-01 LAB — LDL CHOLESTEROL, DIRECT: Direct LDL: 51 mg/dL

## 2018-11-02 ENCOUNTER — Encounter: Payer: Self-pay | Admitting: Family Medicine

## 2018-11-02 ENCOUNTER — Ambulatory Visit (INDEPENDENT_AMBULATORY_CARE_PROVIDER_SITE_OTHER): Payer: Medicare Other | Admitting: Family Medicine

## 2018-11-02 VITALS — BP 124/76 | HR 82 | Temp 96.9°F | Resp 18 | Ht 65.0 in | Wt 208.6 lb

## 2018-11-02 DIAGNOSIS — Z713 Dietary counseling and surveillance: Secondary | ICD-10-CM

## 2018-11-02 DIAGNOSIS — H9203 Otalgia, bilateral: Secondary | ICD-10-CM | POA: Diagnosis not present

## 2018-11-02 DIAGNOSIS — R7303 Prediabetes: Secondary | ICD-10-CM | POA: Diagnosis not present

## 2018-11-02 NOTE — Progress Notes (Signed)
Subjective:    Patient ID: Alexandra Bishop, female    DOB: [birth year], 73 y.o.   MRN: [id number]  HPI   Patient presents to clinic complaining of bilateral ear pain.  States it was worse 2 days ago, but she bought over-the-counter ear pain drops and has been using them and they seem to have helped.  Upon waking this morning ears are feeling much better.  Decided to give appointment to have ears looked at to make sure she did not have infection.  Patient also concerned still with her weight and how to achieve weight loss.  States with her last blood work she was told she is prediabetic, and she will also focus on weight loss to help improve her health.  Patient Active Problem List   Diagnosis Date Noted  . Depression with anxiety 09/06/2018  . Chronic hip pain 05/09/2018  . Breast skin lesion 03/02/2018  . Rash 03/02/2018  . Prediabetes 03/02/2018  . Cutaneous skin tags 03/02/2018  . Post-traumatic arthritis of right ankle 11/23/2017  . Petechial rash 11/23/2017  . Ganglion cyst of volar aspect of right wrist 11/23/2017  . De Quervain's tenosynovitis, right 08/09/2017  . Lymphedema 09/15/2016  . Candidal intertrigo 11/12/2015  . Rosacea 06/12/2015  . Hemorrhoid 09/05/2014  . Osteopenia of thigh 03/29/2014  . Chronic low back pain 06/27/2013  . Psoriasis 11/30/2012  . Persistent nodularity of breast 08/28/2012  . Insomnia 04/10/2012  . Postmenopausal estrogen deficiency 01/24/2012  . Chronic venous insufficiency 08/20/2011  . Seborrheic keratoses, inflamed 08/20/2011  . Obesity 04/14/2011  . Hypothyroidism 01/04/2011  . Hyperlipidemia 01/04/2011  . Hypertension 01/04/2011  . SUPRAVENTRICULAR PREMATURE BEATS 09/04/2009   Social History   Tobacco Use  . Smoking status: Never Smoker  . Smokeless tobacco: Never Used  . Tobacco comment: tobacco use - no   Substance Use Topics  . Alcohol use: No   Review of Systems   Constitutional: Negative for chills, fatigue and fever.   HENT: Negative for congestion, sinus pain and sore throat. Ear pain 2 days ago, better today since using OTC ear drops  Eyes: Negative.   Respiratory: Negative for cough, shortness of breath and wheezing.   Cardiovascular: Negative for chest pain, palpitations and leg swelling.  Gastrointestinal: Negative for abdominal pain, diarrhea, nausea and vomiting.  Genitourinary: Negative for dysuria, frequency and urgency.  Musculoskeletal: Negative for arthralgias and myalgias.  Skin: Negative for color change, pallor and rash.  Neurological: Negative for syncope, light-headedness and headaches.  Psychiatric/Behavioral: The patient is not nervous/anxious.       Objective:   Physical Exam Constitutional:      General: She is not in acute distress.    Appearance: She is not toxic-appearing or diaphoretic.  HENT:     Head: Normocephalic and atraumatic.     Right Ear: Tympanic membrane and ear canal normal.     Left Ear: Tympanic membrane and ear canal normal.     Ears:     Comments: Small amount of tan ear wax bilat, not blocking TMs.  Some dry skin on external ear (chronic rash/scalp dryness issue - sees DERM) Eyes:     General: No scleral icterus.    Extraocular Movements: Extraocular movements intact.     Pupils: Pupils are equal, round, and reactive to light.  Cardiovascular:     Rate and Rhythm: Normal rate and regular rhythm.  Pulmonary:     Effort: Pulmonary effort is normal. No respiratory distress.     Breath  sounds: Normal breath sounds.  Neurological:     Mental Status: She is alert.  Psychiatric:        Mood and Affect: Mood is anxious.     Comments: Anxious about weight, weight loss strategy    Today's Vitals   11/02/18 1103  BP: 124/76  Pulse: 82  Resp: 18  Temp: (!) 96.9 F (36.1 C)  TempSrc: Temporal  SpO2: 96%  Weight: 208 lb 9.6 oz (94.6 kg)  Height: 5\' 5"  (1.651 m)   Body mass index is 34.71 kg/m.     Assessment & Plan:     A total of 25  minutes  were spent face-to-face with the patient during this encounter and over half of that time was spent on counseling and coordination of care. The patient was counseled on healthy eating, weight loss, ear pain  Ear pain- ears do not appear infected or full fluid at this time.  Patient advised she can continue using the over-the-counter ear drops PRN times of ear pain.  Advised if ear pain ever persist even after using the eardrops and does not improve as expected, to come into office and at that time we will do examination of the ears and decide if antibiotic is needed.  Nutrition counseling/weight loss counseling/prediabetes-patient reassured that overall her lab work done yesterday looks good.  Current A1c is 6.0%, this does place her in prediabetic range.  Long discussion with patient in regards to healthy eating and regular physical activity.  Patient is very focused on starting some sort of specific diet.  Try to explain to patient that overall health is achieved through eating nutritious and balanced meals, watching calorie intake and exercising regularly.  Advised patient that she does not have to follow a specific diet to achieve this, different diet ideas are all guidelines to help improve her overall health.  Oftentimes different types of diets such as the paleo diet; can cause [place] to feel restricted.  The best types of diets are something that we feel works for ourselves & our lifestyle and something that can be maintained over time.  Patient will keep regularly scheduled follow-up PCP as planned at the end of August.

## 2018-11-03 ENCOUNTER — Other Ambulatory Visit: Payer: Self-pay

## 2018-11-04 ENCOUNTER — Other Ambulatory Visit: Payer: Self-pay | Admitting: Family Medicine

## 2018-11-06 ENCOUNTER — Ambulatory Visit (INDEPENDENT_AMBULATORY_CARE_PROVIDER_SITE_OTHER): Payer: Medicare Other | Admitting: Family Medicine

## 2018-11-06 ENCOUNTER — Other Ambulatory Visit: Payer: Self-pay

## 2018-11-06 ENCOUNTER — Encounter: Payer: Self-pay | Admitting: Family Medicine

## 2018-11-06 VITALS — BP 125/80 | HR 78 | Temp 97.8°F | Ht 65.0 in | Wt 211.4 lb

## 2018-11-06 DIAGNOSIS — T22011D Burn of unspecified degree of right forearm, subsequent encounter: Secondary | ICD-10-CM

## 2018-11-06 DIAGNOSIS — M25531 Pain in right wrist: Secondary | ICD-10-CM

## 2018-11-06 DIAGNOSIS — L219 Seborrheic dermatitis, unspecified: Secondary | ICD-10-CM | POA: Insufficient documentation

## 2018-11-06 DIAGNOSIS — R7303 Prediabetes: Secondary | ICD-10-CM | POA: Diagnosis not present

## 2018-11-06 DIAGNOSIS — F418 Other specified anxiety disorders: Secondary | ICD-10-CM

## 2018-11-06 DIAGNOSIS — T300 Burn of unspecified body region, unspecified degree: Secondary | ICD-10-CM | POA: Insufficient documentation

## 2018-11-06 NOTE — Assessment & Plan Note (Signed)
She will contact her dermatologist for follow-up.

## 2018-11-06 NOTE — Patient Instructions (Addendum)
Nice to see you. Please contact dermatology at [phone] to schedule an appointment. Please continue calorie counting and exercising. You can wash the area of your burn with soap and water and could use a topical antibiotic ointment if desired. I have referred you to orthopedics as well for your right wrist. Please monitor the depression and if this worsens please let us know.

## 2018-11-06 NOTE — Assessment & Plan Note (Signed)
Mild depression.  She declines medication.  She will monitor.

## 2018-11-06 NOTE — Assessment & Plan Note (Signed)
Encouraged continued exercise and counting calories.  Plan to recheck in 4 months.

## 2018-11-06 NOTE — Assessment & Plan Note (Signed)
Small burn noted on right radial aspect forearm that is well-healing.  Discussed cleansing with soap and water and using topical antibiotic ointment.  If not continuing to heal she will let us know.

## 2018-11-06 NOTE — Progress Notes (Signed)
Tommi Rumps, MD Phone: [phone]  Alexandra Bishop is a 73 y.o. female who presents today for follow-up.  Seborrheic dermatitis: Patient has seen dermatology and was diagnosed with either seborrheic dermatitis or psoriasis.  She has been using a shampoo and topical steroids with minimal benefit.  This continues to be an issue.  There is itching.  Depression: She does note some mild depression.  She feels fatigued and weary at times.  No anxiety.  She defers medications and did see a therapist recently.  No SI.  Prediabetes: Most recent A1c 6.0.  She has seen a nutritionist and they recommended calorie counting.  She is limiting calorie intake to 1200 cal daily.  She is walking 6 days a week and doing physical therapy exercises.  Right wrist pain: Notes some radial aspect discomfort though that is fairly minor.  Notes there is a area on the volar surface of her wrist that does bother her at times.  She wears a compression sleeve on her wrist.  She has not been evaluated by orthopedics for this.  Burn: Patient reports a burn on her right forearm that has been progressively improving.  This occurred several weeks ago.  Social History   Tobacco Use  Smoking Status Never Smoker  Smokeless Tobacco Never Used  Tobacco Comment   tobacco use - no      ROS see history of present illness  Objective  Physical Exam Vitals:   11/06/18 1054  BP: 125/80  Pulse: 78  Temp: 97.8 F (36.6 C)  SpO2: 98%    BP Readings from Last 3 Encounters:  11/06/18 125/80  11/02/18 124/76  05/08/18 130/70   Wt Readings from Last 3 Encounters:  11/06/18 211 lb 6.4 oz (95.9 kg)  11/02/18 208 lb 9.6 oz (94.6 kg)  09/06/18 206 lb (93.4 kg)    Physical Exam Constitutional:      General: She is not in acute distress.    Appearance: She is not diaphoretic.  Cardiovascular:     Rate and Rhythm: Normal rate and regular rhythm.     Heart sounds: Normal heart sounds.  Pulmonary:     Effort:  Pulmonary effort is normal.     Breath sounds: Normal breath sounds.  Musculoskeletal:       Arms:     Right lower leg: No edema.     Left lower leg: No edema.  Skin:    General: Skin is warm and dry.     Comments: Slight scalp flaking and flaking on her external ears noted  Neurological:     Mental Status: She is alert.      Assessment/Plan: Please see individual problem list.  Seborrheic dermatitis She Bishop contact her dermatologist for follow-up.  Depression with anxiety Mild depression.  She declines medication.  She Bishop monitor.  Right wrist pain Possible cyst playing a role in this and also possible de Quervain's tenosynovitis.  Discussed icing several times a day for up to 10 minutes.  Discussed risk of nerve damage with prolonged icing.  We Bishop refer to orthopedics.  Prediabetes Encouraged continued exercise and counting calories.  Plan to recheck in 4 months.  Burn Small burn noted on right radial aspect forearm that is well-healing.  Discussed cleansing with soap and water and using topical antibiotic ointment.  If not continuing to heal she Bishop let us know.   Orders Placed This Encounter  Procedures  . Ambulatory referral to Orthopedic Surgery    Referral Priority:   Routine  Referral Type:   Surgical    Referral Reason:   Specialty Services Required    Requested Specialty:   Orthopedic Surgery    Number of Visits Requested:   1    No orders of the defined types were placed in this encounter.    Tommi Rumps, MD [place]

## 2018-11-06 NOTE — Assessment & Plan Note (Signed)
Possible cyst playing a role in this and also possible de Quervain's tenosynovitis.  Discussed icing several times a day for up to 10 minutes.  Discussed risk of nerve damage with prolonged icing.  We will refer to orthopedics.

## 2018-11-13 DIAGNOSIS — L218 Other seborrheic dermatitis: Secondary | ICD-10-CM | POA: Diagnosis not present

## 2018-11-13 DIAGNOSIS — L820 Inflamed seborrheic keratosis: Secondary | ICD-10-CM | POA: Diagnosis not present

## 2018-11-13 DIAGNOSIS — L304 Erythema intertrigo: Secondary | ICD-10-CM | POA: Diagnosis not present

## 2018-11-24 DIAGNOSIS — M25571 Pain in right ankle and joints of right foot: Secondary | ICD-10-CM | POA: Diagnosis not present

## 2018-11-24 DIAGNOSIS — M67431 Ganglion, right wrist: Secondary | ICD-10-CM | POA: Diagnosis not present

## 2018-11-24 DIAGNOSIS — Z8781 Personal history of (healed) traumatic fracture: Secondary | ICD-10-CM | POA: Diagnosis not present

## 2018-11-24 DIAGNOSIS — M19041 Primary osteoarthritis, right hand: Secondary | ICD-10-CM | POA: Diagnosis not present

## 2018-11-24 DIAGNOSIS — Z9889 Other specified postprocedural states: Secondary | ICD-10-CM | POA: Diagnosis not present

## 2018-11-24 DIAGNOSIS — M19049 Primary osteoarthritis, unspecified hand: Secondary | ICD-10-CM | POA: Diagnosis not present

## 2018-12-01 ENCOUNTER — Other Ambulatory Visit: Payer: Self-pay

## 2018-12-01 ENCOUNTER — Ambulatory Visit (INDEPENDENT_AMBULATORY_CARE_PROVIDER_SITE_OTHER): Payer: Medicare Other

## 2018-12-01 DIAGNOSIS — Z23 Encounter for immunization: Secondary | ICD-10-CM | POA: Diagnosis not present

## 2018-12-21 DIAGNOSIS — L900 Lichen sclerosus et atrophicus: Secondary | ICD-10-CM | POA: Diagnosis not present

## 2019-01-05 DIAGNOSIS — H2513 Age-related nuclear cataract, bilateral: Secondary | ICD-10-CM | POA: Diagnosis not present

## 2019-01-31 ENCOUNTER — Other Ambulatory Visit: Payer: Self-pay | Admitting: Certified Nurse Midwife

## 2019-01-31 DIAGNOSIS — K649 Unspecified hemorrhoids: Secondary | ICD-10-CM | POA: Diagnosis not present

## 2019-01-31 DIAGNOSIS — Z1231 Encounter for screening mammogram for malignant neoplasm of breast: Secondary | ICD-10-CM

## 2019-02-14 ENCOUNTER — Other Ambulatory Visit: Payer: Self-pay

## 2019-02-19 DIAGNOSIS — K644 Residual hemorrhoidal skin tags: Secondary | ICD-10-CM | POA: Diagnosis not present

## 2019-02-19 DIAGNOSIS — L820 Inflamed seborrheic keratosis: Secondary | ICD-10-CM | POA: Diagnosis not present

## 2019-02-19 DIAGNOSIS — L218 Other seborrheic dermatitis: Secondary | ICD-10-CM | POA: Diagnosis not present

## 2019-02-26 ENCOUNTER — Telehealth: Payer: Self-pay

## 2019-02-26 NOTE — Telephone Encounter (Signed)
She does not need any labs for this visit.

## 2019-02-26 NOTE — Telephone Encounter (Signed)
Patient called & informed no labs are needed.

## 2019-02-26 NOTE — Telephone Encounter (Signed)
Copied from [place] [id number]. Topic: General - Other >> Feb 26, 2019 12:01 PM Rainey Pines A wrote: Patient would like a callback in regards to if Dr Caryl Bis would like her to complete bloodwork prior to her appointment,

## 2019-02-28 ENCOUNTER — Other Ambulatory Visit: Payer: Self-pay | Admitting: Family Medicine

## 2019-03-01 ENCOUNTER — Ambulatory Visit: Payer: Medicare Other

## 2019-03-02 ENCOUNTER — Ambulatory Visit (INDEPENDENT_AMBULATORY_CARE_PROVIDER_SITE_OTHER): Payer: Medicare Other | Admitting: Family Medicine

## 2019-03-02 ENCOUNTER — Ambulatory Visit: Payer: Medicare Other

## 2019-03-02 ENCOUNTER — Other Ambulatory Visit: Payer: Self-pay

## 2019-03-02 ENCOUNTER — Encounter: Payer: Self-pay | Admitting: Family Medicine

## 2019-03-02 DIAGNOSIS — L219 Seborrheic dermatitis, unspecified: Secondary | ICD-10-CM

## 2019-03-02 DIAGNOSIS — M1990 Unspecified osteoarthritis, unspecified site: Secondary | ICD-10-CM | POA: Diagnosis not present

## 2019-03-02 DIAGNOSIS — Z6834 Body mass index (BMI) 34.0-34.9, adult: Secondary | ICD-10-CM | POA: Diagnosis not present

## 2019-03-02 DIAGNOSIS — F418 Other specified anxiety disorders: Secondary | ICD-10-CM | POA: Diagnosis not present

## 2019-03-02 DIAGNOSIS — E6609 Other obesity due to excess calories: Secondary | ICD-10-CM

## 2019-03-02 DIAGNOSIS — K649 Unspecified hemorrhoids: Secondary | ICD-10-CM | POA: Diagnosis not present

## 2019-03-02 NOTE — Assessment & Plan Note (Signed)
She will continue treatments through dermatology.

## 2019-03-02 NOTE — Progress Notes (Signed)
Virtual Visit via telephone Note  This visit type was conducted due to national recommendations for restrictions regarding the COVID-19 pandemic (e.g. social distancing).  This format is felt to be most appropriate for this patient at this time.  All issues noted in this document were discussed and addressed.  No physical exam was performed (except for noted visual exam findings with Video Visits).   I connected with Alexandra Bishop today at  2:45 PM EST by telephone and verified that I am speaking with the correct person using two identifiers. Location patient: home Location provider: home office Persons participating in the virtual visit: patient, provider  I discussed the limitations, risks, security and privacy concerns of performing an evaluation and management service by telephone and the availability of in person appointments. I also discussed with the patient that there may be a patient responsible charge related to this service. The patient expressed understanding and agreed to proceed.  Interactive audio and video telecommunications were attempted between this provider and patient, however failed, due to patient having technical difficulties OR patient did not have access to video capability.  We continued and completed visit with audio only.   Reason for visit: follow-up  HPI: Obesity: Patient notes she is a little discouraged regarding her weight.  She has not been exercising much recently.  She wants to start walking 3 days a week.  She is following a combination Mediterranean-Dash diet.  She eats lots of yogurt.  Some chocolate chips.  Lots of fruits and vegetables as well as proteins and salads.  Lots of hard boiled eggs.  Hemorrhoid: Patient notes this was identified by her gynecologist.  She had no issues with that regarding bleeding or irritation.  She does use a cream on it occasionally.  She has increased her fiber intake.  Arthritis: She reports she feels as though she has  arthritis in her fingers as she can see the changes externally.  She was found to have osteoarthritis on x-ray in 2019.  She feels like she may have arthritis in her hands, ankle, and neck.  She has known severe arthritis from an ankle fracture previously.  Psoriasis/seborrheic dermatitis: Patient is seeing dermatology for this.  She is using her topical treatments.  She wonders if stress is contributing.  Stress: Patient does note some stress in her life.  She denies specific depression or anxiety though does note she is weary with the stress.   ROS: See pertinent positives and negatives per HPI.  Past Medical History:  Diagnosis Date  . Chronic pain of right ankle 11/09/2017  . Hypothyroidism     Past Surgical History:  Procedure Laterality Date  . BREAST BIOPSY Right    neg  . broken ankle - RT    . BUNIONECTOMY  2015    Family History  Problem Relation Age of Onset  . Cancer Mother        sarcoma  . Heart disease Brother   . Cancer Maternal Grandmother        breast  . Breast cancer Maternal Grandmother   . Cancer Paternal Grandmother        breast  . Cancer Other        family hx  . Thyroid disease Other        family hx  . Thyroid cancer Neg Hx     SOCIAL HX: Non-smoker   Current Outpatient Medications:  .  albuterol (PROVENTIL HFA;VENTOLIN HFA) 108 (90 Base) MCG/ACT inhaler, INHALE 2 PUFFS  INTO THE LUNGS Q 4 H PRF WHEEZING, Disp: , Rfl:  .  amLODipine (NORVASC) 10 MG tablet, TAKE 1 TABLET BY MOUTH EVERY DAY, Disp: 90 tablet, Rfl: 1 .  aspirin EC 81 MG tablet, Take 81 mg by mouth every other day., Disp: , Rfl:  .  atorvastatin (LIPITOR) 20 MG tablet, TAKE ONE TABLET BY MOUTH DAILY, Disp: 90 tablet, Rfl: 3 .  CALCIUM-MAGNESIUM-ZINC PO, Take by mouth. Twice weekly, Disp: , Rfl:  .  clobetasol cream (TEMOVATE) 0.05 %, Apply topically., Disp: , Rfl:  .  clotrimazole (LOTRIMIN) 1 % cream, Apply topically., Disp: , Rfl:  .  cyanocobalamin 500 MCG tablet, Take 500 mcg  by mouth once a week., Disp: , Rfl:  .  fluocinonide (LIDEX) 0.05 % external solution, Apply topically., Disp: , Rfl:  .  gabapentin (NEURONTIN) 100 MG capsule, TAKE 1 CAPSULE (100 MG TOTAL) BY MOUTH 4 (FOUR) TIMES DAILY., Disp: 360 capsule, Rfl: 2 .  hydrocortisone cream 1 %, Apply topically., Disp: , Rfl:  .  ketoconazole (NIZORAL) 2 % shampoo, , Disp: , Rfl:  .  levothyroxine (SYNTHROID) 100 MCG tablet, Take 1 tablet by mouth once daily, Disp: 90 tablet, Rfl: 0 .  Multiple Vitamin (MULTIVITAMIN) tablet, Take 1 tablet by mouth every other day., Disp: , Rfl:  .  nystatin cream (MYCOSTATIN), Apply 1 application topically 2 (two) times daily., Disp: 30 g, Rfl: 0 .  pimecrolimus (ELIDEL) 1 % cream, Apply topically 2 (two) times daily., Disp: , Rfl:  .  Potassium Gluconate 595 MG CAPS, Take by mouth every other day., Disp: , Rfl:   EXAM: This was a telehealth telephone visit and thus no physical exam was completed.  ASSESSMENT AND PLAN:  Discussed the following assessment and plan:  Hemorrhoid She will monitor for irritation and bleeding.  Seborrheic dermatitis She will continue treatments through dermatology.  Depression with anxiety Notes no depression or anxiety though does note quite a bit of stress.  Offered support.  Discussed that we could trial medication for depression if needed in the future.  Offered referral for therapy as well though she deferred.  She will monitor.  Obesity Encouraged adding in exercise.  She will continue healthy diet.  Arthritis Appears to have osteoarthritis based on prior x-ray imaging.  She does not have pain and thus does not need any medication for this currently.    I discussed the assessment and treatment plan with the patient. The patient was provided an opportunity to ask questions and all were answered. The patient agreed with the plan and demonstrated an understanding of the instructions.   The patient was advised to call back or seek an  in-person evaluation if the symptoms worsen or if the condition fails to improve as anticipated.  I provided 23 minutes of non-face-to-face time during this encounter.   Tommi Rumps, MD

## 2019-03-02 NOTE — Assessment & Plan Note (Signed)
Appears to have osteoarthritis based on prior x-ray imaging.  She does not have pain and thus does not need any medication for this currently.

## 2019-03-02 NOTE — Assessment & Plan Note (Signed)
She will monitor for irritation and bleeding.

## 2019-03-02 NOTE — Assessment & Plan Note (Signed)
Encouraged adding in exercise.  She will continue healthy diet.

## 2019-03-02 NOTE — Assessment & Plan Note (Signed)
Notes no depression or anxiety though does note quite a bit of stress.  Offered support.  Discussed that we could trial medication for depression if needed in the future.  Offered referral for therapy as well though she deferred.  She will monitor.

## 2019-03-05 ENCOUNTER — Telehealth: Payer: Self-pay

## 2019-03-05 NOTE — Telephone Encounter (Signed)
A note from the pharmacy came in asking if they could refill the levothyroxine with a different brand because the brand is not available, I sent them a fax stating that yes they could.  Alexandra Bishop,cma

## 2019-03-06 ENCOUNTER — Telehealth: Payer: Self-pay

## 2019-03-06 ENCOUNTER — Other Ambulatory Visit: Payer: Self-pay

## 2019-03-06 ENCOUNTER — Ambulatory Visit (INDEPENDENT_AMBULATORY_CARE_PROVIDER_SITE_OTHER): Payer: Medicare Other

## 2019-03-06 DIAGNOSIS — Z0000 Encounter for general adult medical examination without abnormal findings: Secondary | ICD-10-CM

## 2019-03-06 NOTE — Progress Notes (Signed)
Subjective:   Alexandra Bishop is a 73 y.o. female who presents for Medicare Annual (Subsequent) preventive examination.  Review of Systems:  No ROS.  Medicare Wellness Virtual Visit.  Visual/audio telehealth visit, UTA vital signs.   See social history for additional risk factors.   Cardiac Risk Factors include: advanced age (>22mn, >>52women);hypertension     Objective:     Vitals: There were no vitals taken for this visit.  There is no height or weight on file to calculate BMI.  Advanced Directives 03/06/2019 01/11/2018 01/10/2017 09/13/2016 01/08/2016 01/07/2015  Does Patient Have a Medical Advance Directive? Yes No No No No Yes  Type of AParamedicof APleasant CityLiving will - - - - HPress photographerLiving will  Does patient want to make changes to medical advance directive? No - Patient declined Yes (MAU/Ambulatory/Procedural Areas - Information given) No - Patient declined - - No - Patient declined  Copy of [place]at[place]in Chart? No - copy requested - - - - No - copy requested  Would patient like information on creating a medical advance directive? - - - - Yes - Educational materials given -    Tobacco Social History   Tobacco Use  Smoking Status Never Smoker  Smokeless Tobacco Never Used  Tobacco Comment   tobacco use - no      Counseling given: Not Answered Comment: tobacco use - no    Clinical Intake:  Pre-visit preparation completed: Yes        Diabetes: No  How often do you need to have someone help you when you read instructions, pamphlets, or other written materials from your doctor or pharmacy?: 1 - Never  Interpreter Needed?: No     Past Medical History:  Diagnosis Date  . Chronic pain of right ankle 11/09/2017  . Hypothyroidism    Past Surgical History:  Procedure Laterality Date  . BREAST BIOPSY Right    neg  . broken ankle - RT    . BUNIONECTOMY  2015   Family History  Problem Relation  Age of Onset  . Cancer Mother        sarcoma  . Heart disease Brother   . Cancer Maternal Grandmother        breast  . Breast cancer Maternal Grandmother   . Cancer Paternal Grandmother        breast  . Cancer Other        family hx  . Thyroid disease Other        family hx  . Thyroid cancer Neg Hx    Social History   Socioeconomic History  . Marital status: Married    Spouse name: Not on file  . Number of children: Not on file  . Years of education: Not on file  . Highest education level: Not on file  Occupational History  . Not on file  Tobacco Use  . Smoking status: Never Smoker  . Smokeless tobacco: Never Used  . Tobacco comment: tobacco use - no   Substance and Sexual Activity  . Alcohol use: No  . Drug use: No  . Sexual activity: Never  Other Topics Concern  . Not on file  Social History Narrative   [place]    Married; retired - office work; gets regular exercise - walks a mile most days. [place] Husband and her are following Paleo diet.    Social  Determinants of Health   Financial Resource Strain:   . Difficulty of Paying Living Expenses: Not on file  Food Insecurity:   . Worried About Charity fundraiser in the Last Year: Not on file  . Ran Out of Food in the Last Year: Not on file  Transportation Needs:   . Lack of Transportation (Medical): Not on file  . Lack of Transportation (Non-Medical): Not on file  Physical Activity:   . Days of Exercise per Week: Not on file  . Minutes of Exercise per Session: Not on file  Stress:   . Feeling of Stress : Not on file  Social Connections:   . Frequency of Communication with Friends and Family: Not on file  . Frequency of Social Gatherings with Friends and Family: Not on file  . Attends Religious Services: Not on file  . Active Member of Clubs or Organizations: Not on file  . Attends Archivist Meetings: Not on file  .  Marital Status: Not on file    Outpatient Encounter Medications as of 03/06/2019  Medication Sig  . albuterol (PROVENTIL HFA;VENTOLIN HFA) 108 (90 Base) MCG/ACT inhaler INHALE 2 PUFFS INTO THE LUNGS Q 4 H PRF WHEEZING  . amLODipine (NORVASC) 10 MG tablet TAKE 1 TABLET BY MOUTH EVERY DAY  . aspirin EC 81 MG tablet Take 81 mg by mouth every other day.  Marland Kitchen atorvastatin (LIPITOR) 20 MG tablet TAKE ONE TABLET BY MOUTH DAILY  . CALCIUM-MAGNESIUM-ZINC PO Take by mouth. Twice weekly  . clobetasol cream (TEMOVATE) 0.05 % Apply topically.  . clotrimazole (LOTRIMIN) 1 % cream Apply topically.  . cyanocobalamin 500 MCG tablet Take 500 mcg by mouth once a week.  . fluocinonide (LIDEX) 0.05 % external solution Apply topically.  . gabapentin (NEURONTIN) 100 MG capsule TAKE 1 CAPSULE (100 MG TOTAL) BY MOUTH 4 (FOUR) TIMES DAILY.  . hydrocortisone cream 1 % Apply topically.  Marland Kitchen ketoconazole (NIZORAL) 2 % shampoo   . levothyroxine (SYNTHROID) 100 MCG tablet Take 1 tablet by mouth once daily  . Multiple Vitamin (MULTIVITAMIN) tablet Take 1 tablet by mouth every other day.  . nystatin cream (MYCOSTATIN) Apply 1 application topically 2 (two) times daily.  . pimecrolimus (ELIDEL) 1 % cream Apply topically 2 (two) times daily.  . Potassium Gluconate 595 MG CAPS Take by mouth every other day.   No facility-administered encounter medications on file as of 03/06/2019.    Activities of Daily Living In your present state of health, do you have any difficulty performing the following activities: 03/06/2019  Hearing? N  Vision? N  Difficulty concentrating or making decisions? N  Walking or climbing stairs? N  Dressing or bathing? N  Doing errands, shopping? N  Preparing Food and eating ? N  Using the Toilet? N  In the past six months, have you accidently leaked urine? N  Do you have problems with loss of bowel control? N  Managing your Medications? N  Managing your Finances? Y  Comment Husband assist    Housekeeping or managing your Housekeeping? N  Some recent data might be hidden    Patient Care Team: Leone Haven, MD as PCP - General (Family Medicine)    Assessment:   This is a routine wellness examination for Alexandra Bishop.  Nurse connected with patient 03/06/19 at 10:30 AM EST by a telephone enabled telemedicine application and verified that I am speaking with the correct person using two identifiers. Patient stated full name and DOB. Patient gave permission  to continue with virtual visit. Patient's location was at home and Nurse's location was at [place] office.   Patient is alert and oriented x3. Patient denies difficulty focusing or concentrating. Patient likes to cross stitch and watches [employer] for brain stimulation.   Health Maintenance Due: Mammogram- scheduled 02/2019 See completed HM at the end of note.   Eye: Visual acuity not assessed. Virtual visit. Followed by their ophthalmologist.  Dental: UTD  Hearing: Demonstrates normal hearing during visit.  Safety:  Patient feels safe at home- yes Patient does have smoke detectors at home- yes Patient does wear sunscreen or protective clothing when in direct sunlight - yes Patient does wear seat belt when in a moving vehicle - yes Patient drives- yes Adequate lighting in walkways free from debris- yes Grab bars and handrails used as appropriate- yes Ambulates with an assistive device- no Cell phone on person when ambulating outside of the home- yes  Social: Alcohol intake - no     Smoking history- never   Smokers in home? none Illicit drug use? none  Medication: Taking as directed and without issues.  Pill box in use -yes  Self managed - yes   Covid-19: Precautions and sickness symptoms discussed. Wears mask, social distancing, hand hygiene as appropriate.   Activities of Daily Living Patient denies needing assistance with: household chores, feeding themselves, getting from bed to chair, getting to the  toilet, bathing/showering, dressing, or preparing meals. Husband assists with financial management.    Discussed the importance of a healthy diet, water intake and the benefits of aerobic exercise.  Educational material provided.  Physical activity- walking, stenosis exercises.   Diet:  Mediterranean  Water: fair intake  Other Providers Patient Care Team: Leone Haven, MD as PCP - General (Family Medicine)  Exercise Activities and Dietary recommendations Current Exercise Habits: Home exercise routine, Type of exercise: walking, Intensity: Mild  Goals    . Follow up with Primary Care Provider     As needed       Fall Risk Fall Risk  03/06/2019 03/02/2019 11/06/2018 03/01/2018 01/11/2018  Falls in the past year? 0 0 0 0 No  Number falls in past yr: - 0 0 0 -  Comment - - - - -  Injury with Fall? - - - 0 -  Comment - - - - -  Risk for fall due to : - - - - -  Follow up Falls prevention discussed Falls evaluation completed - - -   Timed Get Up and Go performed: no, virtual visit  Depression Screen PHQ 2/9 Scores 03/06/2019 03/02/2019 11/06/2018 09/06/2018  PHQ - 2 Score 0 0 0 3  PHQ- 9 Score - - - 6     Cognitive Function MMSE - Mini Mental State Exam 01/10/2017 01/08/2016 01/07/2015  Orientation to time 5 5 5   Orientation to Place 5 5 5   Registration 3 3 3   Attention/ Calculation 5 5 5   Recall 3 3 3   Language- name 2 objects 2 2 2   Language- repeat 1 1 1   Language- follow 3 step command 3 3 3   Language- read & follow direction 1 1 1   Write a sentence 1 1 1   Copy design 1 1 1   Total score 30 30 30      6CIT Screen 03/06/2019 01/11/2018  What Year? 0 points 0 points  What month? 0 points 0 points  What time? 0 points 0 points  Count back from 20 0 points 0 points  Months  in reverse 0 points 0 points  Repeat phrase 0 points 0 points  Total Score 0 0    Immunization History  Administered Date(s) Administered  . Fluad Quad(high Dose 65+) 12/01/2018  .  Influenza Split 01/04/2011, 11/24/2011, 12/13/2013  . Influenza, High Dose Seasonal PF 12/24/2016  . Influenza,inj,Quad PF,6+ Mos 11/12/2015  . Influenza,inj,quad, With Preservative 11/12/2015  . Influenza-Unspecified 01/03/2013, 12/23/2014, 12/24/2016, 11/08/2017, 12/12/2017  . Pneumococcal Conjugate-13 12/03/2013, 10/18/2016  . Pneumococcal Polysaccharide-23 07/29/2009, 11/08/2017  . Tdap 01/12/2008, 08/11/2015  . Zoster 11/24/2011   Screening Tests Health Maintenance  Topic Date Due  . COLONOSCOPY  05/10/2021  . TETANUS/TDAP  08/10/2025  . INFLUENZA VACCINE  Completed  . DEXA SCAN  Completed  . Hepatitis C Screening  Completed      Plan:   Keep all routine maintenance appointments.   Medicare Attestation I have personally reviewed: The patient's medical and social history Their use of alcohol, tobacco or illicit drugs Their current medications and supplements The patient's functional ability including ADLs,fall risks, home safety risks, cognitive, and hearing and visual impairment Diet and physical activities Evidence for depression   I have reviewed and discussed with patient certain preventive protocols, quality metrics, and best practice recommendations.   Varney Biles, LPN  [id number]

## 2019-03-06 NOTE — Patient Instructions (Addendum)
  Alexandra Bishop , Thank you for taking time to come for your Medicare Wellness Visit. I appreciate your ongoing commitment to your health goals. Please review the following plan we discussed and let me know if I can assist you in the future.   These are the goals we discussed: Goals    . Follow up with Primary Care Provider     As needed       This is a list of the screening recommended for you and due dates:  Health Maintenance  Topic Date Due  . Colon Cancer Screening  05/10/2021  . Tetanus Vaccine  08/10/2025  . Flu Shot  Completed  . DEXA scan (bone density measurement)  Completed  .  Hepatitis C: One time screening is recommended by Center for Disease Control  (CDC) for  adults born from [birth year] through 1967.   Completed

## 2019-03-06 NOTE — Telephone Encounter (Signed)
Medical Records and X-rays were brought in by the patient for Provider r to review, they were placed in the review basket today.  Makinna Andy,cma

## 2019-03-19 NOTE — Telephone Encounter (Signed)
Pt called to check on what Dr. Caryl Bis thought of her Xrays

## 2019-03-20 ENCOUNTER — Ambulatory Visit
Admission: RE | Admit: 2019-03-20 | Discharge: 2019-03-20 | Disposition: A | Discharge status: Home / Self Care | Payer: Medicare Other | Source: Ambulatory Visit | Attending: Certified Nurse Midwife | Admitting: Certified Nurse Midwife

## 2019-03-20 DIAGNOSIS — Z1231 Encounter for screening mammogram for malignant neoplasm of breast: Secondary | ICD-10-CM | POA: Insufficient documentation

## 2019-03-27 NOTE — Telephone Encounter (Addendum)
The patient brought in printed out images of her x-rays. There is not a radiology read though the Orthopedic PA did read them. His interpretation seems consistent with what I see, though It would be best to have the final radiology read to determine the final results of the x-rays. The x-rays can be placed back at the front for the patient to pick up for her records.

## 2019-03-27 NOTE — Telephone Encounter (Signed)
I called and informed the patient that the provider needed a final read from radiology on her xrays and she stated she would try to get them.  She wanted her xrays kept for her record here.  Nin,cma

## 2019-03-30 ENCOUNTER — Other Ambulatory Visit: Payer: Self-pay | Admitting: Certified Nurse Midwife

## 2019-03-30 DIAGNOSIS — M79621 Pain in right upper arm: Secondary | ICD-10-CM

## 2019-03-30 DIAGNOSIS — Z1231 Encounter for screening mammogram for malignant neoplasm of breast: Secondary | ICD-10-CM

## 2019-04-04 ENCOUNTER — Encounter: Payer: Self-pay | Admitting: Family Medicine

## 2019-04-04 ENCOUNTER — Telehealth: Payer: Self-pay | Admitting: Family Medicine

## 2019-04-04 NOTE — Telephone Encounter (Signed)
Pt called about would like a call regarding xray results from last week at [facility].

## 2019-04-05 NOTE — Telephone Encounter (Signed)
Patient went over to [place] and gave per mission for records?

## 2019-04-06 NOTE — Telephone Encounter (Signed)
I thought these were in my results basket. Do you happen to have them?

## 2019-04-12 ENCOUNTER — Ambulatory Visit
Admission: RE | Admit: 2019-04-12 | Discharge: 2019-04-12 | Disposition: A | Discharge status: Home / Self Care | Payer: Medicare Other | Source: Ambulatory Visit | Attending: Certified Nurse Midwife | Admitting: Certified Nurse Midwife

## 2019-04-12 DIAGNOSIS — M79621 Pain in right upper arm: Secondary | ICD-10-CM

## 2019-04-12 DIAGNOSIS — Z1231 Encounter for screening mammogram for malignant neoplasm of breast: Secondary | ICD-10-CM

## 2019-04-12 DIAGNOSIS — R928 Other abnormal and inconclusive findings on diagnostic imaging of breast: Secondary | ICD-10-CM | POA: Diagnosis not present

## 2019-04-12 DIAGNOSIS — Z1239 Encounter for other screening for malignant neoplasm of breast: Secondary | ICD-10-CM | POA: Diagnosis not present

## 2019-05-07 DIAGNOSIS — L738 Other specified follicular disorders: Secondary | ICD-10-CM | POA: Diagnosis not present

## 2019-05-07 DIAGNOSIS — L821 Other seborrheic keratosis: Secondary | ICD-10-CM | POA: Diagnosis not present

## 2019-05-07 DIAGNOSIS — L578 Other skin changes due to chronic exposure to nonionizing radiation: Secondary | ICD-10-CM | POA: Diagnosis not present

## 2019-05-07 DIAGNOSIS — L219 Seborrheic dermatitis, unspecified: Secondary | ICD-10-CM | POA: Diagnosis not present

## 2019-05-07 DIAGNOSIS — L309 Dermatitis, unspecified: Secondary | ICD-10-CM | POA: Diagnosis not present

## 2019-05-21 ENCOUNTER — Other Ambulatory Visit: Payer: Self-pay | Admitting: Family Medicine

## 2019-05-28 ENCOUNTER — Telehealth: Payer: Self-pay | Admitting: Family Medicine

## 2019-05-28 ENCOUNTER — Other Ambulatory Visit: Payer: Self-pay | Admitting: Family Medicine

## 2019-05-28 DIAGNOSIS — E039 Hypothyroidism, unspecified: Secondary | ICD-10-CM

## 2019-05-28 DIAGNOSIS — I10 Essential (primary) hypertension: Secondary | ICD-10-CM

## 2019-05-28 DIAGNOSIS — E785 Hyperlipidemia, unspecified: Secondary | ICD-10-CM

## 2019-05-28 DIAGNOSIS — R7303 Prediabetes: Secondary | ICD-10-CM

## 2019-05-28 DIAGNOSIS — M1990 Unspecified osteoarthritis, unspecified site: Secondary | ICD-10-CM

## 2019-05-28 NOTE — Telephone Encounter (Signed)
Pt would like to have labs done before her appt on 3/16 Also she would like a call back to go over her weight and things that she would like to bring up at the appt

## 2019-05-28 NOTE — Telephone Encounter (Signed)
I called and informed the patient that the provider ordered the labs and I scheduled her lab appointment.  Yeng Frankie,cma

## 2019-05-28 NOTE — Telephone Encounter (Signed)
Ordered

## 2019-05-28 NOTE — Telephone Encounter (Signed)
Patient wants labs done before her appt.  Alexandra Bishop,cma

## 2019-05-28 NOTE — Addendum Note (Signed)
Addended by: Caryl Bis Torri Michalski G on: 05/28/2019 12:49 PM   Modules accepted: Orders

## 2019-05-31 ENCOUNTER — Telehealth: Payer: Self-pay

## 2019-05-31 DIAGNOSIS — M1990 Unspecified osteoarthritis, unspecified site: Secondary | ICD-10-CM

## 2019-05-31 NOTE — Telephone Encounter (Signed)
I called the patient and the pharmacy and informed them that the manufacturer was ok to be changed. Sarina Robleto,cma

## 2019-06-01 MED ORDER — GABAPENTIN 100 MG PO CAPS: 100.0000 mg | ORAL_CAPSULE | Freq: Three times a day (TID) | ORAL | 2 refills | Status: DC

## 2019-06-01 NOTE — Telephone Encounter (Signed)
I called the patient and she stated it was ok to do the 3 times per day.  Alexandra Bishop,cma

## 2019-06-01 NOTE — Telephone Encounter (Signed)
This drug is on their formulary but subject to a quantity limit.  They will not provide more that 90 capsules every 30 days. The list they have does not have Gabapentin on their lists of alternative. Donell Sliwinski,cma

## 2019-06-01 NOTE — Telephone Encounter (Signed)
It looks like she is taking the gabapentin 4x/day. We could switch to three times per day and see if she does ok with that. Please make sure she is ok with that.

## 2019-06-01 NOTE — Telephone Encounter (Signed)
Sent to pharmacy 

## 2019-06-01 NOTE — Telephone Encounter (Signed)
Can you give me a list of what they will cover since I won't be in the office today?

## 2019-06-04 ENCOUNTER — Other Ambulatory Visit: Payer: Self-pay

## 2019-06-04 ENCOUNTER — Other Ambulatory Visit (INDEPENDENT_AMBULATORY_CARE_PROVIDER_SITE_OTHER): Payer: Medicare Other

## 2019-06-04 DIAGNOSIS — E039 Hypothyroidism, unspecified: Secondary | ICD-10-CM | POA: Diagnosis not present

## 2019-06-04 DIAGNOSIS — E785 Hyperlipidemia, unspecified: Secondary | ICD-10-CM | POA: Diagnosis not present

## 2019-06-04 DIAGNOSIS — R7303 Prediabetes: Secondary | ICD-10-CM

## 2019-06-04 DIAGNOSIS — I10 Essential (primary) hypertension: Secondary | ICD-10-CM

## 2019-06-04 LAB — LIPID PANEL
Cholesterol: 127 mg/dL (ref 0–200)
HDL: 53.4 mg/dL (ref >39.00)
LDL Cholesterol: 40 mg/dL (ref 0–99)
NonHDL: 73.85
Total CHOL/HDL Ratio: 2
Triglycerides: 171 mg/dL — ABNORMAL HIGH (ref 0.0–149.0)
VLDL: 34.2 mg/dL (ref 0.0–40.0)

## 2019-06-04 LAB — COMPREHENSIVE METABOLIC PANEL
ALT: 29 U/L (ref 0–35)
AST: 29 U/L (ref 0–37)
Albumin: 4.2 g/dL (ref 3.5–5.2)
Alkaline Phosphatase: 92 U/L (ref 39–117)
BUN: 9 mg/dL (ref 6–23)
CO2: 29 mEq/L (ref 19–32)
Calcium: 9.4 mg/dL (ref 8.4–10.5)
Chloride: 105 mEq/L (ref 96–112)
Creatinine, Ser: 0.8 mg/dL (ref 0.40–1.20)
GFR: 69.77 mL/min (ref >60.00)
Glucose, Bld: 99 mg/dL (ref 70–99)
Potassium: 3.9 mEq/L (ref 3.5–5.1)
Sodium: 141 mEq/L (ref 135–145)
Total Bilirubin: 1 mg/dL (ref 0.2–1.2)
Total Protein: 6.9 g/dL (ref 6.0–8.3)

## 2019-06-04 LAB — TSH: TSH: 0.62 u[IU]/mL (ref 0.35–4.50)

## 2019-06-04 LAB — HEMOGLOBIN A1C: Hgb A1c MFr Bld: 5.8 % (ref 4.6–6.5)

## 2019-06-05 ENCOUNTER — Ambulatory Visit: Payer: Medicare Other | Admitting: Family Medicine

## 2019-06-05 ENCOUNTER — Telehealth: Payer: Self-pay | Admitting: Family Medicine

## 2019-06-05 NOTE — Telephone Encounter (Signed)
Pt returned called and would like a call back for lab results.

## 2019-06-05 NOTE — Telephone Encounter (Signed)
I called and spoke with the patient and informed her that the provider will discuss her labs at her visit, but I did not see any concerns in looking at her labs, she understood.  Nina,cma

## 2019-06-05 NOTE — Telephone Encounter (Signed)
Pt called wanting to get her lab results

## 2019-06-05 NOTE — Telephone Encounter (Signed)
I called and left a VM for the patient that the results to her labs will be discussed at her appointment. Kamile Fassler,cma

## 2019-06-11 ENCOUNTER — Other Ambulatory Visit: Payer: Self-pay

## 2019-06-11 ENCOUNTER — Ambulatory Visit (INDEPENDENT_AMBULATORY_CARE_PROVIDER_SITE_OTHER): Payer: Medicare Other | Admitting: Family Medicine

## 2019-06-11 ENCOUNTER — Encounter: Payer: Self-pay | Admitting: Family Medicine

## 2019-06-11 ENCOUNTER — Telehealth: Payer: Self-pay | Admitting: Family Medicine

## 2019-06-11 DIAGNOSIS — R7303 Prediabetes: Secondary | ICD-10-CM

## 2019-06-11 DIAGNOSIS — Z6835 Body mass index (BMI) 35.0-35.9, adult: Secondary | ICD-10-CM | POA: Diagnosis not present

## 2019-06-11 DIAGNOSIS — E785 Hyperlipidemia, unspecified: Secondary | ICD-10-CM | POA: Diagnosis not present

## 2019-06-11 DIAGNOSIS — E039 Hypothyroidism, unspecified: Secondary | ICD-10-CM | POA: Diagnosis not present

## 2019-06-11 DIAGNOSIS — M5440 Lumbago with sciatica, unspecified side: Secondary | ICD-10-CM | POA: Diagnosis not present

## 2019-06-11 DIAGNOSIS — G8929 Other chronic pain: Secondary | ICD-10-CM

## 2019-06-11 DIAGNOSIS — S61213A Laceration without foreign body of left middle finger without damage to nail, initial encounter: Secondary | ICD-10-CM

## 2019-06-11 DIAGNOSIS — S61219A Laceration without foreign body of unspecified finger without damage to nail, initial encounter: Secondary | ICD-10-CM | POA: Insufficient documentation

## 2019-06-11 NOTE — Assessment & Plan Note (Signed)
Encouraged diet and exercise.  

## 2019-06-11 NOTE — Progress Notes (Signed)
Virtual Visit via telephone Note  This visit type was conducted due to national recommendations for restrictions regarding the COVID-19 pandemic (e.g. social distancing).  This format is felt to be most appropriate for this patient at this time.  All issues noted in this document were discussed and addressed.  No physical exam was performed (except for noted visual exam findings with Video Visits).   I connected with Alexandra Bishop today at 11:00 AM EDT by telephone and verified that I am speaking with the correct person using two identifiers. Location patient: home Location provider: work Persons participating in the virtual visit: patient, provider  I discussed the limitations, risks, security and privacy concerns of performing an evaluation and management service by telephone and the availability of in person appointments. I also discussed with the patient that there may be a patient responsible charge related to this service. The patient expressed understanding and agreed to proceed.  Interactive audio and video telecommunications were attempted between this provider and patient, however failed, due to patient having technical difficulties OR patient did not have access to video capability.  We continued and completed visit with audio only.   Reason for visit: follow-up  HPI: Hyperlipidemia: Still taking Lipitor.  No chest pain, right upper quadrant pain, or myalgias.  Hypothyroidism: Taking Synthroid.  TSH acceptable.  She wonders if she can remove the Synthroid from the blister pack and place it in her weekly pill box.  Obesity: She continues with the DASH diet.  She does eat a fair bit of bread.  She has not been doing any significant exercising other than her physical therapy exercises.   Prediabetes: A1c slightly improved.  See above regarding diet and exercise.  Chronic low back pain: She does some physical therapy exercises for this.  She wonders about periodically going back to  physical therapy.  Finger laceration: Patient notes she had a small cut on her left middle finger.  She notes it has been healing well with no signs of infection.   ROS: See pertinent positives and negatives per HPI.  Past Medical History:  Diagnosis Date  . Chronic pain of right ankle 11/09/2017  . Hypothyroidism     Past Surgical History:  Procedure Laterality Date  . BREAST BIOPSY Right    neg  . broken ankle - RT    . BUNIONECTOMY  2015    Family History  Problem Relation Age of Onset  . Cancer Mother        sarcoma  . Heart disease Brother   . Cancer Maternal Grandmother        breast  . Breast cancer Maternal Grandmother   . Cancer Paternal Grandmother        breast  . Cancer Other        family hx  . Thyroid disease Other        family hx  . Thyroid cancer Neg Hx     SOCIAL HX: non-smoker   Current Outpatient Medications:  .  albuterol (PROVENTIL HFA;VENTOLIN HFA) 108 (90 Base) MCG/ACT inhaler, INHALE 2 PUFFS INTO THE LUNGS Q 4 H PRF WHEEZING, Disp: , Rfl:  .  amLODipine (NORVASC) 10 MG tablet, TAKE 1 TABLET BY MOUTH EVERY DAY, Disp: 90 tablet, Rfl: 1 .  aspirin EC 81 MG tablet, Take 81 mg by mouth every other day., Disp: , Rfl:  .  atorvastatin (LIPITOR) 20 MG tablet, TAKE ONE TABLET BY MOUTH DAILY, Disp: 90 tablet, Rfl: 3 .  CALCIUM-MAGNESIUM-ZINC PO,  Take by mouth. Twice weekly, Disp: , Rfl:  .  clobetasol cream (TEMOVATE) 0.05 %, Apply topically., Disp: , Rfl:  .  clotrimazole (LOTRIMIN) 1 % cream, Apply topically., Disp: , Rfl:  .  cyanocobalamin 500 MCG tablet, Take 500 mcg by mouth once a week., Disp: , Rfl:  .  fluocinonide (LIDEX) 0.05 % external solution, Apply topically., Disp: , Rfl:  .  gabapentin (NEURONTIN) 100 MG capsule, Take 1 capsule (100 mg total) by mouth 3 (three) times daily., Disp: 90 capsule, Rfl: 2 .  hydrocortisone cream 1 %, Apply topically., Disp: , Rfl:  .  ketoconazole (NIZORAL) 2 % shampoo, , Disp: , Rfl:  .  levothyroxine  (SYNTHROID) 100 MCG tablet, Take 1 tablet by mouth once daily, Disp: 90 tablet, Rfl: 0 .  Multiple Vitamin (MULTIVITAMIN) tablet, Take 1 tablet by mouth every other day., Disp: , Rfl:  .  nystatin cream (MYCOSTATIN), Apply 1 application topically 2 (two) times daily., Disp: 30 g, Rfl: 0 .  pimecrolimus (ELIDEL) 1 % cream, Apply topically 2 (two) times daily., Disp: , Rfl:  .  Potassium Gluconate 595 MG CAPS, Take by mouth every other day., Disp: , Rfl:   EXAM: This was a telephone visit and thus no physical exam was completed.   ASSESSMENT AND PLAN:  Discussed the following assessment and plan:  Hypothyroidism Continue Synthroid.  Message sent to our clinical pharmacist to ensure that she can remove the Synthroid from a blister pack though I advised that to my knowledge this will be okay.  Hyperlipidemia Continue Lipitor.  Discussed decreasing carbohydrate intake for her triglycerides.  Obesity Encouraged adding in exercise and monitoring her diet.  Discussed that medication management for weight loss could play a role although she has to have lifestyle changes in place first.  Prediabetes Encouraged diet and exercise.  Finger laceration Very mild per patient report.  This sounds to be healing well.  She will monitor for signs of infection.  Chronic low back pain Refer back to physical therapy at patient request.   Orders Placed This Encounter  Procedures  . Ambulatory referral to Physical Therapy    Referral Priority:   Routine    Referral Type:   Physical Medicine    Referral Reason:   Specialty Services Required    Requested Specialty:   Physical Therapy    Number of Visits Requested:   1    No orders of the defined types were placed in this encounter.    I discussed the assessment and treatment plan with the patient. The patient was provided an opportunity to ask questions and all were answered. The patient agreed with the plan and demonstrated an understanding of the  instructions.   The patient was advised to call back or seek an in-person evaluation if the symptoms worsen or if the condition fails to improve as anticipated.  I provided 17 minutes of non-face-to-face time during this encounter.   Tommi Rumps, MD

## 2019-06-11 NOTE — Assessment & Plan Note (Signed)
Continue Synthroid.  Message sent to our clinical pharmacist to ensure that she can remove the Synthroid from a blister pack though I advised that to my knowledge this will be okay.

## 2019-06-11 NOTE — Telephone Encounter (Signed)
-----   Message from De Hollingshead, [facility] sent at 06/11/2019  1:57 PM EDT ----- Nope, no problem with doing that.   Catie ----- Message ----- From: Leone Haven, MD Sent: 06/11/2019   1:17 PM EDT To: De Hollingshead, [facility]  Hey Catie,   This patient reports she got her synthroid in a blister pack recently. She wanted to take them out of the blister pack and put the tablets in her weekly pill box. I did not think this would be an issues, but want to confirm if you knew of any reason not to remove the synthroid from the blister pack. Thanks.   Randall Hiss

## 2019-06-11 NOTE — Assessment & Plan Note (Signed)
Very mild per patient report.  This sounds to be healing well.  She will monitor for signs of infection.

## 2019-06-11 NOTE — Assessment & Plan Note (Signed)
Continue Lipitor.  Discussed decreasing carbohydrate intake for her triglycerides.

## 2019-06-11 NOTE — Assessment & Plan Note (Signed)
Refer back to physical therapy at patient request.

## 2019-06-11 NOTE — Assessment & Plan Note (Signed)
Encouraged adding in exercise and monitoring her diet.  Discussed that medication management for weight loss could play a role although she has to have lifestyle changes in place first.

## 2019-06-26 DIAGNOSIS — M545 Low back pain: Secondary | ICD-10-CM | POA: Diagnosis not present

## 2019-07-30 DIAGNOSIS — L738 Other specified follicular disorders: Secondary | ICD-10-CM | POA: Diagnosis not present

## 2019-07-30 DIAGNOSIS — L409 Psoriasis, unspecified: Secondary | ICD-10-CM | POA: Diagnosis not present

## 2019-07-30 DIAGNOSIS — L578 Other skin changes due to chronic exposure to nonionizing radiation: Secondary | ICD-10-CM | POA: Diagnosis not present

## 2019-07-30 DIAGNOSIS — L820 Inflamed seborrheic keratosis: Secondary | ICD-10-CM | POA: Diagnosis not present

## 2019-07-30 DIAGNOSIS — L309 Dermatitis, unspecified: Secondary | ICD-10-CM | POA: Diagnosis not present

## 2019-07-30 DIAGNOSIS — L219 Seborrheic dermatitis, unspecified: Secondary | ICD-10-CM | POA: Diagnosis not present

## 2019-08-24 ENCOUNTER — Other Ambulatory Visit: Payer: Self-pay | Admitting: Family Medicine

## 2019-09-10 ENCOUNTER — Ambulatory Visit: Payer: Medicare Other | Admitting: Family Medicine

## 2019-09-12 ENCOUNTER — Encounter: Payer: Self-pay | Admitting: Family Medicine

## 2019-09-12 ENCOUNTER — Other Ambulatory Visit: Payer: Self-pay

## 2019-09-12 ENCOUNTER — Ambulatory Visit (INDEPENDENT_AMBULATORY_CARE_PROVIDER_SITE_OTHER): Payer: Medicare Other | Admitting: Family Medicine

## 2019-09-12 DIAGNOSIS — R21 Rash and other nonspecific skin eruption: Secondary | ICD-10-CM | POA: Diagnosis not present

## 2019-09-12 DIAGNOSIS — M1990 Unspecified osteoarthritis, unspecified site: Secondary | ICD-10-CM | POA: Diagnosis not present

## 2019-09-12 DIAGNOSIS — Z6835 Body mass index (BMI) 35.0-35.9, adult: Secondary | ICD-10-CM

## 2019-09-12 DIAGNOSIS — M654 Radial styloid tenosynovitis [de Quervain]: Secondary | ICD-10-CM | POA: Diagnosis not present

## 2019-09-12 DIAGNOSIS — I10 Essential (primary) hypertension: Secondary | ICD-10-CM

## 2019-09-12 DIAGNOSIS — E785 Hyperlipidemia, unspecified: Secondary | ICD-10-CM

## 2019-09-12 NOTE — Progress Notes (Signed)
Tommi Rumps, MD Phone: [phone]  Alexandra Bishop is a 74 y.o. female who presents today for f/u.  Hyperlipidemia: Taking Crestor.  Well controlled on recent labs.  Hypertension: Adequately controlled.  On amlodipine.  Obesity: Patient has tried to stick to a DASH/Mediterranean diet.  She does not count calories.  She has not been exercising consistently.  Left arm pain: It is in her upper left arm in the posterior aspect.  Started yesterday and resolved on her way to the office today.  No injury.  It was 2 out of 10 and slightly sharp.  Movement did not bring it on.  Right radial aspect of wrist and thumb pain: This has been going on for quite some time.  She wears a glove to help this.  There is slight discomfort on Finkelstein's testing.  Left knee pain: This is a chronic intermittent issue.  3/10 pain.  It will randomly bother her.  No injury.  Rash: Patient notes after cutting the grass she will randomly have red splotches on her legs.  She wonders if she is getting bitten by something.  She started wearing pants and still had a slight rash.  Social History   Tobacco Use  Smoking Status Never Smoker  Smokeless Tobacco Never Used  Tobacco Comment   tobacco use - no      ROS see history of present illness  Objective  Physical Exam Vitals:   09/12/19 1058  BP: 122/80  Pulse: 83  Temp: (!) 96.8 F (36 C)  SpO2: 96%    BP Readings from Last 3 Encounters:  09/12/19 122/80  11/06/18 125/80  11/02/18 124/76   Wt Readings from Last 3 Encounters:  09/12/19 216 lb 12.8 oz (98.3 kg)  06/11/19 214 lb (97.1 kg)  03/02/19 210 lb (95.3 kg)    Physical Exam Constitutional:      General: She is not in acute distress.    Appearance: She is not diaphoretic.  Cardiovascular:     Rate and Rhythm: Normal rate and regular rhythm.     Heart sounds: Normal heart sounds.  Pulmonary:     Effort: Pulmonary effort is normal.     Breath sounds: Normal breath sounds.    Musculoskeletal:     Right lower leg: No edema.     Left lower leg: No edema.     Comments: Slight discomfort on Finkelstein's testing at right wrist, there is slight discomfort over the 1st carpometacarpal joint, slight discomfort over the radial styloid process on palpation, left shoulder without any tenderness, there is full range of motion passively without any discomfort, left knee with slight medial joint line tenderness, no warmth or erythema, negative McMurray's  Skin:    General: Skin is warm and dry.       Neurological:     Mental Status: She is alert.      Assessment/Plan: Please see individual problem list.  Hypertension Well-controlled.  Continue amlodipine.  Rash Suspect related to bug bite.  Discussed bug spray on her legs while she is cutting the grass to see if that prevents these from occurring.  De Quervain's tenosynovitis, right I suspect this is causing her right wrist discomfort.  Discussed taking a piece of ice and icing the area for 10 minutes 3 times a day.  Arthritis Suspect osteoarthritis is causing her left knee pain.  Discussed she could try turmeric as a natural anti-inflammatory to see if that would help.  Discussed not taking more than 1 capsule daily.  Discussed overuse of turmeric could cause liver issues.  Obesity Encouraged adding in exercise.  Discussed calorie counting.  Discussed a goal of 1500 cal/day would equal about 1 pound of weight loss per week.  Advised not to drop below 1200 cal/day.  Follow-up in 3 months.  Hyperlipidemia Continue Crestor.  Lipid panel well controlled.   No orders of the defined types were placed in this encounter.   No orders of the defined types were placed in this encounter.   This visit occurred during the SARS-CoV-2 public health emergency.  Safety protocols were in place, including screening questions prior to the visit, additional usage of staff PPE, and extensive cleaning of exam room while observing  appropriate contact time as indicated for disinfecting solutions.    Tommi Rumps, MD [place]

## 2019-09-12 NOTE — Assessment & Plan Note (Signed)
Encouraged adding in exercise.  Discussed calorie counting.  Discussed a goal of 1500 cal/day would equal about 1 pound of weight loss per week.  Advised not to drop below 1200 cal/day.  Follow-up in 3 months.

## 2019-09-12 NOTE — Assessment & Plan Note (Signed)
Suspect related to bug bite.  Discussed bug spray on her legs while she is cutting the grass to see if that prevents these from occurring.

## 2019-09-12 NOTE — Assessment & Plan Note (Signed)
Well controlled. Continue amlodipine 

## 2019-09-12 NOTE — Assessment & Plan Note (Signed)
Suspect osteoarthritis is causing her left knee pain.  Discussed she could try turmeric as a natural anti-inflammatory to see if that would help.  Discussed not taking more than 1 capsule daily.  Discussed overuse of turmeric could cause liver issues.

## 2019-09-12 NOTE — Patient Instructions (Signed)
Nice to see you. Please try adding in exercise with walking 3 days a week.  Please schedule and try to stick with this. Calorie goal for 1 pound of weight loss per week is about 1500 cal a day.  There are many calorie counting apps that you can use to do this. Please try using some bug spray on your legs when you cut the grass. Please ice your right wrist as we discussed. You could try turmeric as a natural anti-inflammatory.  Please do not take more than 1 capsule/day.

## 2019-09-12 NOTE — Assessment & Plan Note (Signed)
Continue Crestor  Lipid panel well controlled

## 2019-09-12 NOTE — Assessment & Plan Note (Signed)
I suspect this is causing her right wrist discomfort.  Discussed taking a piece of ice and icing the area for 10 minutes 3 times a day.

## 2019-10-02 ENCOUNTER — Encounter: Payer: Self-pay | Admitting: Nurse Practitioner

## 2019-10-02 ENCOUNTER — Other Ambulatory Visit: Payer: Self-pay

## 2019-10-02 ENCOUNTER — Telehealth (INDEPENDENT_AMBULATORY_CARE_PROVIDER_SITE_OTHER): Payer: Medicare Other | Admitting: Nurse Practitioner

## 2019-10-02 VITALS — Ht 65.0 in | Wt 212.0 lb

## 2019-10-02 DIAGNOSIS — R519 Headache, unspecified: Secondary | ICD-10-CM

## 2019-10-02 NOTE — Progress Notes (Signed)
I connected with Alexandra Bishop on @date @ by telephone and verified that I am speaking with the correct person using two identifiers.   Location:  Patient: home Provider: office    I discussed the limitations, risks, security and privacy concerns of performing an evaluation and management service by telephone and the availability of in person appointments. I also discussed with the patient that there may be a patient responsible charge related to this service. The patient expressed understanding and agreed to proceed.   History of Present Illness:   This 74 yo reports onset a few days ago of dry eyes and mild dull HA, located behind her bilat eyes that is off and on a few hours. Rated as mild HA. No congestion. No cough, or sinusitis symptoms.  No ear pain. No HA now. She took a nap and has not even 1 out of 10 after a nap.   Along with this is another type of headache pain - a  sharp pain/stabbing fleeting pain on the right side of her head above her ear. This is very localized and very fleeting. It is a random pain without known triggers . This can occur 2-3 times in a day. This started over the weekend. No skin rashes. No ear pain, fullness, drainage. She took Tylenol one day and it helped. No N/V or fever/chills. No visual changes, blurred vision, or eye pain. No change in speech, trouble thinking or finding her words, or extremity numbness/weakness. No nausea or vomiting. She has had no balance problems, falls or head injury. No tick bites. No migraine history or C spine pain history.   Observations/Objective:   VITALS per patient if applicable:  Wt 212 lbs reported   GENERAL:  Sounds alert, oriented, appears well and in no acute distress  HEENT: She pressed on her auricle and has no pain to suggest otis externa.   NECK: Reports normal movements of the head and neck.   LUNGS: No signs of respiratory distress, breathing rate appears normal, and able to talk in complete sentences. Relaxed.  No cough. No nasal congested tone to her voice.  SKIN: Patient  sees no skin rash on her head or neck  PSYCH/NEURO: pleasant and cooperative, no obvious depression or anxiety, speech and thought processing grossly intact  Assessment and Plan:  Right Headache above the ear: Patient is having sharp fleeting pain above her right  ear, occurring 2-3 times a day without any specific triggers. No pain in this area otherwise. No rash. Reports past Shingles vaccine.  This has started over the weekend.  She has never had pain like this.  She also had a very mild headache behind the eyes without any sinusitis symptoms, relieved with a nap or one dose of Tylenol. The patient reports a negative NEURO ROS symptoms otherwise. She has clear speech, fully alert and an excellent historian over the phone.   I discussed this case with Dr. Derrel Nip regarding imaging studies. We discussed head CT versus Brain MRI.  Dr. Derrel Nip has seen this type of sharp pain that is fleeting and not persistent associated with cervical spine and recommends beginning evaluation with MRI of the C-spine. Patient advised to seek emergency care if the HA worsens or is associated with any neurology sx or N/V.  Take Tylenol as needed. She has no sinus Sx to recommend anti histamine or local decongestants. I do not think  this is a eustachian tube or otitis media problem per her description. She pressed on her auricle  and has no pain to suggest otis externa. I discussed that it is possible to have pain before a shingles rash outbreak and advised that she monitor for rash, blisters/bumps. Reviewed alarm symptoms to seek in-person care.   Follow Up Instructions:     I discussed the assessment and treatment plan with the patient. The patient was provided an opportunity to ask questions and all were answered. The patient agreed with the plan and demonstrated an understanding of the instructions.     The patient was advised to call back or seek an  in-person evaluation if the symptoms worsen or if the condition fails to improve as anticipated.   I provided 15 minutes of non-face-to-face time during this encounter.

## 2019-10-03 ENCOUNTER — Encounter: Payer: Self-pay | Admitting: Nurse Practitioner

## 2019-10-03 ENCOUNTER — Telehealth: Payer: Self-pay | Admitting: Family Medicine

## 2019-10-03 DIAGNOSIS — R519 Headache, unspecified: Secondary | ICD-10-CM | POA: Insufficient documentation

## 2019-10-03 NOTE — Telephone Encounter (Signed)
I tried to call her and no identifying information on her answering machine so I could not leave a message.  Dr. Derrel Nip and I dicussed her type of headache pain and recommend a MRI of her C spine as first test.

## 2019-10-03 NOTE — Telephone Encounter (Signed)
Patient saw Dawson Bills yesterday. Patient called asking if a referral has been placed for a CT and MRI,  As discussed yesterday during telephone call.

## 2019-10-03 NOTE — Patient Instructions (Addendum)
New onset this weekend of: Mild headache behind the eyes without any sinusitis symptoms and another type of right sided fleeting but sharp head pains above the ear.   Alexandra Bishop, I discussed this case with the Dr. Derrel Nip who is Dr. Ellen Henri practice partner regarding imaging studies. We discussed head CT versus Brain MRI.  Dr. Derrel Nip has seen this type of sharp, fleeting pain above the ear associated with cervical spine and recommends beginning evaluation with MRI of the C-spine. I  placed the order and will try to get it done on Thurs. It is also  possible to have pain before a shingles rash outbreak so please monitor for rash, blisters/bumps- as we discussed.  You may take Tylenol as needed. See the  recommendations below.   Get help right away if:  Your headache becomes severe quickly.  Your headache gets worse after moderate to intense physical activity.  You have repeated vomiting.  You have a stiff neck.  You have a loss of vision.  You have problems with speech.  You have pain in the eye or ear.  You have muscular weakness or loss of muscle control.  You lose your balance or have trouble walking.  You feel faint or pass out.  You have confusion.  You have a seizure.  General Headache Without Cause A headache is pain or discomfort felt around the head or neck area. The specific cause of a headache may not be found. There are many causes and types of headaches. A few common ones are:  Tension headaches.  Migraine headaches.  Cluster headaches.  Chronic daily headaches. Follow these instructions at home: Watch your condition for any changes. Let your health care provider know about them. Take these steps to help with your condition: Managing pain      Take over-the-counter and prescription medicines only as told by your health care provider.  Lie down in a dark, quiet room when you have a headache.  If directed, put ice on your head and neck area: ? Put ice  in a plastic bag. ? Place a towel between your skin and the bag. ? Leave the ice on for 20 minutes, 2-3 times per day.  If directed, apply heat to the affected area. Use the heat source that your health care provider recommends, such as a moist heat pack or a heating pad. ? Place a towel between your skin and the heat source. ? Leave the heat on for 20-30 minutes. ? Remove the heat if your skin turns bright red. This is especially important if you are unable to feel pain, heat, or cold. You may have a greater risk of getting burned.  Keep lights dim if bright lights bother you or make your headaches worse. Eating and drinking  Eat meals on a regular schedule.  If you drink alcohol: ? Limit how much you use to:  0-1 drink a day for women.  0-2 drinks a day for men. ? Be aware of how much alcohol is in your drink. In the [place] one drink equals one 12 oz bottle of beer (355 mL), one 5 oz glass of wine (148 mL), or one 1 oz glass of hard liquor (44 mL).  Stop drinking caffeine, or decrease the amount of caffeine you drink. General instructions   Keep a headache journal to help find out what may trigger your headaches. For example, write down: ? What you eat and drink. ? How much sleep you get. ? Any change  to your diet or medicines.  Try massage or other relaxation techniques.  Limit stress.  Sit up straight, and do not tense your muscles.  Do not use any products that contain nicotine or tobacco, such as cigarettes, e-cigarettes, and chewing tobacco. If you need help quitting, ask your health care provider.  Exercise regularly as told by your health care provider.  Sleep on a regular schedule. Get 7-9 hours of sleep each night, or the amount recommended by your health care provider.  Keep all follow-up visits as told by your health care provider. This is important. Contact a health care provider if:  Your symptoms are not helped by medicine.  You have a headache that is  different from the usual headache.  You have nausea or you vomit.  You have a fever. Get help right away if:  Your headache becomes severe quickly.  Your headache gets worse after moderate to intense physical activity.  You have repeated vomiting.  You have a stiff neck.  You have a loss of vision.  You have problems with speech.  You have pain in the eye or ear.  You have muscular weakness or loss of muscle control.  You lose your balance or have trouble walking.  You feel faint or pass out.  You have confusion.  You have a seizure. Summary  A headache is pain or discomfort felt around the head or neck area.  There are many causes and types of headaches. In some cases, the cause may not be found.  Keep a headache journal to help find out what may trigger your headaches. Watch your condition for any changes. Let your health care provider know about them.  Contact a health care provider if you have a headache that is different from the usual headache, or if your symptoms are not helped by medicine.  Get help right away if your headache becomes severe, you vomit, you have a loss of vision, you lose your balance, or you have a seizure. This information is not intended to replace advice given to you by your health care provider. Make sure you discuss any questions you have with your health care provider. Document Revised: 09/26/2017 Document Reviewed: 09/26/2017 Elsevier Patient Education  [employer].

## 2019-10-04 NOTE — Telephone Encounter (Signed)
Pt has been scheduled.  °

## 2019-10-05 ENCOUNTER — Other Ambulatory Visit: Payer: Self-pay

## 2019-10-05 ENCOUNTER — Ambulatory Visit (HOSPITAL_COMMUNITY)
Admission: RE | Admit: 2019-10-05 | Discharge: 2019-10-05 | Disposition: A | Discharge status: Home / Self Care | Payer: Medicare Other | Source: Ambulatory Visit | Attending: Nurse Practitioner | Admitting: Nurse Practitioner

## 2019-10-05 DIAGNOSIS — M4722 Other spondylosis with radiculopathy, cervical region: Secondary | ICD-10-CM | POA: Diagnosis not present

## 2019-10-05 DIAGNOSIS — R519 Headache, unspecified: Secondary | ICD-10-CM | POA: Insufficient documentation

## 2019-10-05 DIAGNOSIS — M4312 Spondylolisthesis, cervical region: Secondary | ICD-10-CM | POA: Diagnosis not present

## 2019-10-05 DIAGNOSIS — M50122 Cervical disc disorder at C5-C6 level with radiculopathy: Secondary | ICD-10-CM | POA: Diagnosis not present

## 2019-10-05 DIAGNOSIS — M5011 Cervical disc disorder with radiculopathy,  high cervical region: Secondary | ICD-10-CM | POA: Diagnosis not present

## 2019-10-08 NOTE — Telephone Encounter (Signed)
Let her know it has not been read, yet.

## 2019-10-08 NOTE — Telephone Encounter (Signed)
Patient calling for MRI results. I have not received any results on patient.

## 2019-10-08 NOTE — Telephone Encounter (Signed)
Pt called to get MRI results  

## 2019-10-08 NOTE — Telephone Encounter (Signed)
Tried to call patient; she answered but then call cut out. Tried to call patient back but would not go through

## 2019-10-09 NOTE — Telephone Encounter (Signed)
Patient was given MRI results yesterday evening

## 2019-10-11 ENCOUNTER — Ambulatory Visit (INDEPENDENT_AMBULATORY_CARE_PROVIDER_SITE_OTHER): Payer: Medicare Other | Admitting: Nurse Practitioner

## 2019-10-11 ENCOUNTER — Telehealth: Payer: Self-pay | Admitting: Family Medicine

## 2019-10-11 ENCOUNTER — Encounter: Payer: Self-pay | Admitting: Nurse Practitioner

## 2019-10-11 ENCOUNTER — Other Ambulatory Visit: Payer: Self-pay

## 2019-10-11 VITALS — BP 106/68 | HR 69 | Temp 98.0°F | Ht 65.0 in | Wt 218.0 lb

## 2019-10-11 DIAGNOSIS — G44219 Episodic tension-type headache, not intractable: Secondary | ICD-10-CM | POA: Diagnosis not present

## 2019-10-11 DIAGNOSIS — M47812 Spondylosis without myelopathy or radiculopathy, cervical region: Secondary | ICD-10-CM | POA: Diagnosis not present

## 2019-10-11 DIAGNOSIS — G4485 Primary stabbing headache: Secondary | ICD-10-CM | POA: Diagnosis not present

## 2019-10-11 DIAGNOSIS — R930 Abnormal findings on diagnostic imaging of skull and head, not elsewhere classified: Secondary | ICD-10-CM

## 2019-10-11 LAB — COMPREHENSIVE METABOLIC PANEL
ALT: 28 U/L (ref 0–35)
AST: 25 U/L (ref 0–37)
Albumin: 4.3 g/dL (ref 3.5–5.2)
Alkaline Phosphatase: 103 U/L (ref 39–117)
BUN: 14 mg/dL (ref 6–23)
CO2: 26 mEq/L (ref 19–32)
Calcium: 9.3 mg/dL (ref 8.4–10.5)
Chloride: 107 mEq/L (ref 96–112)
Creatinine, Ser: 0.8 mg/dL (ref 0.40–1.20)
GFR: 69.71 mL/min (ref >60.00)
Glucose, Bld: 104 mg/dL — ABNORMAL HIGH (ref 70–99)
Potassium: 4.3 mEq/L (ref 3.5–5.1)
Sodium: 140 mEq/L (ref 135–145)
Total Bilirubin: 0.9 mg/dL (ref 0.2–1.2)
Total Protein: 6.4 g/dL (ref 6.0–8.3)

## 2019-10-11 LAB — CBC WITH DIFFERENTIAL/PLATELET
Basophils Absolute: 0.1 10*3/uL (ref 0.0–0.1)
Basophils Relative: 0.9 % (ref 0.0–3.0)
Eosinophils Absolute: 0.3 10*3/uL (ref 0.0–0.7)
Eosinophils Relative: 2.7 % (ref 0.0–5.0)
HCT: 47.6 % — ABNORMAL HIGH (ref 36.0–46.0)
Hemoglobin: 15.5 g/dL — ABNORMAL HIGH (ref 12.0–15.0)
Lymphocytes Relative: 14 % (ref 12.0–46.0)
Lymphs Abs: 1.4 10*3/uL (ref 0.7–4.0)
MCHC: 32.6 g/dL (ref 30.0–36.0)
MCV: 85.4 fl (ref 78.0–100.0)
Monocytes Absolute: 0.7 10*3/uL (ref 0.1–1.0)
Monocytes Relative: 6.6 % (ref 3.0–12.0)
Neutro Abs: 7.5 10*3/uL (ref 1.4–7.7)
Neutrophils Relative %: 75.8 % (ref 43.0–77.0)
Platelets: 242 10*3/uL (ref 150.0–400.0)
RBC: 5.57 Mil/uL — ABNORMAL HIGH (ref 3.87–5.11)
RDW: 13.7 % (ref 11.5–15.5)
WBC: 9.9 10*3/uL (ref 4.0–10.5)

## 2019-10-11 LAB — SEDIMENTATION RATE: Sed Rate: 9 mm/hr (ref 0–30)

## 2019-10-11 LAB — C-REACTIVE PROTEIN: CRP: <1 mg/dL (ref 0.5–20.0)

## 2019-10-11 NOTE — Telephone Encounter (Signed)
lft vm for pt to call ofc to sch MRI. 

## 2019-10-11 NOTE — Progress Notes (Signed)
Established Patient Office Visit  Subjective:  Patient ID: Alexandra Bishop, female    DOB: February 01, 1946  Age: 74 y.o. MRN: [id number]  CC:  Chief Complaint  Patient presents with  . Follow-up    headaches    HPI Alexandra Bishop is a 74 yo who  presents for follow up in-person exam following a  telemedicine visit on 10/02/2019 for new onset daily head aches- described as two different types.    1.) A mild dull ache behind bilateral eyes it comes and goes 3-4 days a week described as a 2-3 out of 10 dull  discomfort that is not pounding or sharp and does not change with activity.  It usually lasts as long as an hour and it goes away if she takes a nap to takes two Tylenol.  Usually does not return the rest of the day.  She has no specific triggers but does admit that she watches a lot of TV.  She has no symptoms of all of the sinus infection such as congestion, facial pain, postnasal drainage or cough.  She does wear contact lenses, last had an eye exam 6 months ago.  She reports  that she watches TV for long periods of time and may sit in her chair too long.  2.) The second headache is described as  a sharper pain in an isolated area- right parietal above her right ear and is described as a fleeting sharp knifelike pain that occurs about three times a day and can occur on and off during the day.  It is a quick stabbing type pain.  She has found no triggers for this.  She wonders if she is getting a crick in her neck from sitting too long watching TV.  She experienced it today while sitting in the bathtub.  It just lasts for a second or two and then resolves.  She has had no jaw pain, or TMJ issues, unusual tearing, fevers, tick exposure. Patient did undergo a C-spine MRI 10/05/2019  and it did show mild multilevel spondylosis with reversal of lordosis and minimal grade 1 C4-C5 anterior listhesis.  Mild C4-7 spinal canal narrowing.  Mild left C3-C4, right C5-6 and bilateral C6-7 neural foraminal narrowing.   She denies any specific neck pain, shoulder pain, upper thoracic back pain, and has experienced no numbness or tingling or weakness in either upper extremity.    Past Medical History:  Diagnosis Date  . Chronic pain of right ankle 11/09/2017  . Hypothyroidism     Past Surgical History:  Procedure Laterality Date  . BREAST BIOPSY Right    neg  . broken ankle - RT    . BUNIONECTOMY  2015    Family History  Problem Relation Age of Onset  . Cancer Mother        sarcoma  . Heart disease Brother   . Cancer Maternal Grandmother        breast  . Breast cancer Maternal Grandmother   . Cancer Paternal Grandmother        breast  . Cancer Other        family hx  . Thyroid disease Other        family hx  . Thyroid cancer Neg Hx     Social History   Socioeconomic History  . Marital status: Married    Spouse name: Not on file  . Number of children: Not on file  . Years of education: Not on file  . Highest education  level: Not on file  Occupational History  . Not on file  Tobacco Use  . Smoking status: Never Smoker  . Smokeless tobacco: Never Used  . Tobacco comment: tobacco use - no   Vaping Use  . Vaping Use: Never used  Substance and Sexual Activity  . Alcohol use: No  . Drug use: No  . Sexual activity: Never  Other Topics Concern  . Not on file  Social History Narrative   From [place] originally. Followed daughter to [place] and son in [place].    Married; retired - office work; gets regular exercise - walks a mile most days. [place]. Husband and her are following Paleo diet.    Social Determinants of Health   Financial Resource Strain:   . Difficulty of Paying Living Expenses:   Food Insecurity:   . Worried About Charity fundraiser in the Last Year:   . Arboriculturist in the Last Year:   Transportation Needs:   . Film/video editor (Medical):   Marland Kitchen Lack of Transportation (Non-Medical):   Physical Activity:   . Days of Exercise per Week:   .  Minutes of Exercise per Session:   Stress:   . Feeling of Stress :   Social Connections:   . Frequency of Communication with Friends and Family:   . Frequency of Social Gatherings with Friends and Family:   . Attends Religious Services:   . Active Member of Clubs or Organizations:   . Attends Archivist Meetings:   Marland Kitchen Marital Status:   Intimate Partner Violence:   . Fear of Current or Ex-Partner:   . Emotionally Abused:   Marland Kitchen Physically Abused:   . Sexually Abused:     Outpatient Medications Prior to Visit  Medication Sig Dispense Refill  . amLODipine (NORVASC) 10 MG tablet TAKE 1 TABLET BY MOUTH EVERY DAY 90 tablet 1  . aspirin EC 81 MG tablet Take 81 mg by mouth every other day.    Marland Kitchen atorvastatin (LIPITOR) 20 MG tablet TAKE ONE TABLET BY MOUTH DAILY 90 tablet 3  . CALCIUM-MAGNESIUM-ZINC PO Take by mouth. Twice weekly    . clobetasol cream (TEMOVATE) 0.05 % Apply topically.    . clotrimazole (LOTRIMIN) 1 % cream Apply topically.    . cyanocobalamin 500 MCG tablet Take 500 mcg by mouth once a week.    . EUTHYROX 100 MCG tablet Take 1 tablet by mouth once daily 90 tablet 0  . fluocinonide (LIDEX) 0.05 % external solution Apply topically.    . gabapentin (NEURONTIN) 100 MG capsule Take 1 capsule (100 mg total) by mouth 3 (three) times daily. 90 capsule 2  . ketoconazole (NIZORAL) 2 % shampoo     . Multiple Vitamin (MULTIVITAMIN) tablet Take 1 tablet by mouth every other day.    . Potassium Gluconate 595 MG CAPS Take by mouth every other day.     No facility-administered medications prior to visit.    Allergies  Allergen Reactions  . Cymbalta [Duloxetine Hcl]     Dizziness  . Duloxetine     Other reaction(s): Unknown Dizziness  . Hydrocodone     Feels Jittery  . Shellfish Allergy     Other reaction(s): Vomiting  . Tramadol     Feels jittery    Review of Systems Pertinent positives noted in history of present illness otherwise review of systems negative.    Objective:    Physical Exam Vitals reviewed.  Constitutional:  Appearance: Normal appearance. She is obese.  HENT:     Head: Normocephalic and atraumatic.     Comments: No temporal or parietal tenderness.     Right Ear: Tympanic membrane, ear canal and external ear normal. There is no impacted cerumen.     Left Ear: Tympanic membrane, ear canal and external ear normal. There is no impacted cerumen.     Nose: Nose normal.  Eyes:     Extraocular Movements: Extraocular movements intact.     Conjunctiva/sclera: Conjunctivae normal.     Pupils: Pupils are equal, round, and reactive to light.  Cardiovascular:     Rate and Rhythm: Normal rate and regular rhythm.     Pulses: Normal pulses.     Heart sounds: Normal heart sounds.  Pulmonary:     Effort: Pulmonary effort is normal.     Breath sounds: Normal breath sounds. No wheezing.  Musculoskeletal:        General: Normal range of motion.     Cervical back: Normal range of motion. No rigidity or tenderness.     Comments: Decreased ROM cervical spine in lateral movements.   Lymphadenopathy:     Cervical: No cervical adenopathy.  Skin:    General: Skin is warm and dry.  Neurological:     General: No focal deficit present.     Mental Status: She is alert and oriented to person, place, and time.     Cranial Nerves: No cranial nerve deficit.     Sensory: No sensory deficit.     Motor: No weakness.     Coordination: Coordination normal.     Gait: Gait normal.     Deep Tendon Reflexes: Reflexes normal.  Psychiatric:        Mood and Affect: Mood normal.        Behavior: Behavior normal.     BP 106/68 (BP Location: Left Arm, Patient Position: Sitting, Cuff Size: Large)   Pulse 69   Temp 98 F (36.7 C) (Oral)   Ht _0  (1.651 m)   Wt (!) 218 lb (98.9 kg)   SpO2 98%   BMI 36.28 kg/m  Wt Readings from Last 3 Encounters:  10/11/19 (!) 218 lb (98.9 kg)  10/02/19 212 lb (96.2 kg)  09/12/19 216 lb 12.8 oz (98.3 kg)      There are no preventive care reminders to display for this patient.  There are no preventive care reminders to display for this patient.  Lab Results  Component Value Date   TSH 0.62 06/04/2019   Lab Results  Component Value Date   WBC 9.9 10/11/2019   HGB 15.5 (H) 10/11/2019   HCT 47.6 (H) 10/11/2019   MCV 85.4 10/11/2019   PLT 242.0 10/11/2019   Lab Results  Component Value Date   NA 140 10/11/2019   K 4.3 10/11/2019   CO2 26 10/11/2019   GLUCOSE 104 (H) 10/11/2019   BUN 14 10/11/2019   CREATININE 0.80 10/11/2019   BILITOT 0.9 10/11/2019   ALKPHOS 103 10/11/2019   AST 25 10/11/2019   ALT 28 10/11/2019   PROT 6.4 10/11/2019   ALBUMIN 4.3 10/11/2019   CALCIUM 9.3 10/11/2019   GFR 69.71 10/11/2019   Lab Results  Component Value Date   CHOL 127 06/04/2019   Lab Results  Component Value Date   HDL 53.40 06/04/2019   Lab Results  Component Value Date   LDLCALC 40 06/04/2019   Lab Results  Component Value Date   TRIG 171.0 (  H) 06/04/2019   Lab Results  Component Value Date   CHOLHDL 2 06/04/2019   Lab Results  Component Value Date   HGBA1C 5.8 06/04/2019      Assessment & Plan:   Problem List Items Addressed This Visit      Nervous and Auditory   Episodic tension-type headache, not intractable   Relevant Orders   MR Brain W Wo Contrast   CBC with Differential/Platelet (Completed)   Comp Met (CMET) (Completed)     Musculoskeletal and Integument   Spondylosis of cervical region without myelopathy or radiculopathy     Other   Primary stabbing headache - Primary   Relevant Orders   MR Brain W Wo Contrast   CBC with Differential/Platelet (Completed)   Comp Met (CMET) (Completed)   C-reactive protein (Completed)   Sedimentation rate (Completed)     Mild intermittent headache behind her bilateral eyes relieved with nap and Tylenol could certainly be a tension headache.  No features of sinus infection, migraine, or rebound HA. She could have eye  strain.   Stabbing intermittent pain in the right parietal area, is fleeting, but occurs 2-3 times a day.  Patient thinks it may be occurring less now than when it started a few weeks ago.  There is been no injury or trauma. The pain is quick and it is not debilitating.  She has significant degenerative disc disease in the cervical spine as well.  Physical exam today showed no cervical tenderness,  radiculopathy,  sensory deficit, or strength deficit in bilateral upper extremities.  She does  have decreased range of motion with her lateral neck movements.  She has normal neurological exam.   I have added blood work today to look for leukocytosis, elevated inflammatory markers.  I have ordered a brain MRI to rule out malignancy, organic disease or structural abnormality.  This could be first presentation of trigeminal neuralgia.  No development of rash to consider zoster.  No temporal artery tenderness.    If the MRI of the brain returns normal, I would recommend consult with neurosurgery, physical therapy for her C spine disease and follow-up with Dr. Caryl Bis.  Her physical exam is normal today with the exception of decreased range of motion C-spine.  No orders of the defined types were placed in this encounter.  Follow-up: Return in about 2 weeks (around 10/25/2019).  This visit occurred during the SARS-CoV-2 public health emergency.  Safety protocols were in place, including screening questions prior to the visit, additional usage of staff PPE, and extensive cleaning of exam room while observing appropriate contact time as indicated for disinfecting solutions.   Denice Paradise, NP

## 2019-10-11 NOTE — Patient Instructions (Addendum)
Please go to the lab today.   I have ordered a brain MRI for your 2 different types of headache- the most concerning is the right side of the head above the ear that is sharp/fleeting 2-3 times a day since about 09/29/19.   You have significant cervical spine degenerative changes. You can have referred pain to the head from this. You have decreased range of motion. Await lab tests and brain MRI results. If these are negative, I would recommend physical therapy for the neck and a consult with neurosurgery.   General Headache Without Cause A headache is pain or discomfort felt around the head or neck area. The specific cause of a headache may not be found. There are many causes and types of headaches. A few common ones are:  Tension headaches.  Migraine headaches.  Cluster headaches.  Chronic daily headaches. Follow these instructions at home: Watch your condition for any changes. Let your health care provider know about them. Take these steps to help with your condition: Managing pain      Take over-the-counter and prescription medicines only as told by your health care provider.  Lie down in a dark, quiet room when you have a headache.  If directed, put ice on your head and neck area: ? Put ice in a plastic bag. ? Place a towel between your skin and the bag. ? Leave the ice on for 20 minutes, 2-3 times per day.  If directed, apply heat to the affected area. Use the heat source that your health care provider recommends, such as a moist heat pack or a heating pad. ? Place a towel between your skin and the heat source. ? Leave the heat on for 20-30 minutes. ? Remove the heat if your skin turns bright red. This is especially important if you are unable to feel pain, heat, or cold. You may have a greater risk of getting burned.  Keep lights dim if bright lights bother you or make your headaches worse. Eating and drinking  Eat meals on a regular schedule.  If you drink  alcohol: ? Limit how much you use to:  0-1 drink a day for women.  0-2 drinks a day for men. ? Be aware of how much alcohol is in your drink. In the [place] one drink equals one 12 oz bottle of beer (355 mL), one 5 oz glass of wine (148 mL), or one 1 oz glass of hard liquor (44 mL).  Stop drinking caffeine, or decrease the amount of caffeine you drink. General instructions   Keep a headache journal to help find out what may trigger your headaches. For example, write down: ? What you eat and drink. ? How much sleep you get. ? Any change to your diet or medicines.  Try massage or other relaxation techniques.  Limit stress.  Sit up straight, and do not tense your muscles.  Do not use any products that contain nicotine or tobacco, such as cigarettes, e-cigarettes, and chewing tobacco. If you need help quitting, ask your health care provider.  Exercise regularly as told by your health care provider.  Sleep on a regular schedule. Get 7-9 hours of sleep each night, or the amount recommended by your health care provider.  Keep all follow-up visits as told by your health care provider. This is important. Contact a health care provider if:  Your symptoms are not helped by medicine.  You have a headache that is different from the usual headache.  You have  nausea or you vomit.  You have a fever. Get help right away if:  Your headache becomes severe quickly.  Your headache gets worse after moderate to intense physical activity.  You have repeated vomiting.  You have a stiff neck.  You have a loss of vision.  You have problems with speech.  You have pain in the eye or ear.  You have muscular weakness or loss of muscle control.  You lose your balance or have trouble walking.  You feel faint or pass out.  You have confusion.  You have a seizure. Summary  A headache is pain or discomfort felt around the head or neck area.  There are many causes and types of  headaches. In some cases, the cause may not be found.  Keep a headache journal to help find out what may trigger your headaches. Watch your condition for any changes. Let your health care provider know about them.  Contact a health care provider if you have a headache that is different from the usual headache, or if your symptoms are not helped by medicine.  Get help right away if your headache becomes severe, you vomit, you have a loss of vision, you lose your balance, or you have a seizure. This information is not intended to replace advice given to you by your health care provider. Make sure you discuss any questions you have with your health care provider. Document Revised: 09/26/2017 Document Reviewed: 09/26/2017 Elsevier Patient Education  [place].

## 2019-10-14 ENCOUNTER — Encounter: Payer: Self-pay | Admitting: Nurse Practitioner

## 2019-10-14 DIAGNOSIS — M47812 Spondylosis without myelopathy or radiculopathy, cervical region: Secondary | ICD-10-CM | POA: Insufficient documentation

## 2019-10-14 DIAGNOSIS — G44219 Episodic tension-type headache, not intractable: Secondary | ICD-10-CM

## 2019-10-14 DIAGNOSIS — G4485 Primary stabbing headache: Secondary | ICD-10-CM | POA: Insufficient documentation

## 2019-10-14 HISTORY — DX: Episodic tension-type headache, not intractable: G44.219

## 2019-10-19 ENCOUNTER — Telehealth: Payer: Self-pay | Admitting: Nurse Practitioner

## 2019-10-19 ENCOUNTER — Ambulatory Visit (HOSPITAL_COMMUNITY)
Admission: RE | Admit: 2019-10-19 | Discharge: 2019-10-19 | Disposition: A | Discharge status: Home / Self Care | Payer: Medicare Other | Source: Ambulatory Visit | Attending: Nurse Practitioner | Admitting: Nurse Practitioner

## 2019-10-19 ENCOUNTER — Other Ambulatory Visit: Payer: Self-pay

## 2019-10-19 DIAGNOSIS — G4485 Primary stabbing headache: Secondary | ICD-10-CM | POA: Insufficient documentation

## 2019-10-19 DIAGNOSIS — G930 Cerebral cysts: Secondary | ICD-10-CM | POA: Diagnosis not present

## 2019-10-19 DIAGNOSIS — I6782 Cerebral ischemia: Secondary | ICD-10-CM | POA: Diagnosis not present

## 2019-10-19 DIAGNOSIS — G44219 Episodic tension-type headache, not intractable: Secondary | ICD-10-CM | POA: Diagnosis not present

## 2019-10-19 DIAGNOSIS — G9389 Other specified disorders of brain: Secondary | ICD-10-CM | POA: Diagnosis not present

## 2019-10-19 DIAGNOSIS — L720 Epidermal cyst: Secondary | ICD-10-CM | POA: Diagnosis not present

## 2019-10-19 MED ORDER — GADOBUTROL 1 MMOL/ML IV SOLN
9.0000 mL | Freq: Once | INTRAVENOUS | Status: AC | PRN
  Administered 2019-10-19: 9 mL via INTRAVENOUS

## 2019-10-19 NOTE — Addendum Note (Signed)
Addended by: Denice Paradise A on: 10/19/2019 02:34 PM   Modules accepted: Orders

## 2019-10-19 NOTE — Telephone Encounter (Signed)
I called [facility] for advice on referral urgency.  A neurosurgeon from their office reviewed her MRI and deemed this as a chronic issue and not emergent.   The patient was given the results. She is in agreement with neurology referral. I placed the referral.  However, she will be driving to [place] next week and will return 11/02/19.    Dr. Irven [place] office is going to call her today to set up an appt.

## 2019-11-06 DIAGNOSIS — G930 Cerebral cysts: Secondary | ICD-10-CM | POA: Insufficient documentation

## 2019-11-08 DIAGNOSIS — G930 Cerebral cysts: Secondary | ICD-10-CM | POA: Diagnosis not present

## 2019-11-16 ENCOUNTER — Other Ambulatory Visit: Payer: Self-pay | Admitting: Family Medicine

## 2019-11-16 DIAGNOSIS — L409 Psoriasis, unspecified: Secondary | ICD-10-CM | POA: Diagnosis not present

## 2019-11-16 DIAGNOSIS — L821 Other seborrheic keratosis: Secondary | ICD-10-CM | POA: Diagnosis not present

## 2019-11-16 DIAGNOSIS — L570 Actinic keratosis: Secondary | ICD-10-CM | POA: Diagnosis not present

## 2019-11-16 DIAGNOSIS — D1801 Hemangioma of skin and subcutaneous tissue: Secondary | ICD-10-CM | POA: Diagnosis not present

## 2019-11-16 DIAGNOSIS — L578 Other skin changes due to chronic exposure to nonionizing radiation: Secondary | ICD-10-CM | POA: Diagnosis not present

## 2019-11-16 DIAGNOSIS — L304 Erythema intertrigo: Secondary | ICD-10-CM | POA: Diagnosis not present

## 2019-11-16 DIAGNOSIS — L219 Seborrheic dermatitis, unspecified: Secondary | ICD-10-CM | POA: Diagnosis not present

## 2019-11-16 DIAGNOSIS — L738 Other specified follicular disorders: Secondary | ICD-10-CM | POA: Diagnosis not present

## 2019-11-18 ENCOUNTER — Other Ambulatory Visit: Payer: Self-pay | Admitting: Family Medicine

## 2019-12-17 ENCOUNTER — Encounter: Payer: Self-pay | Admitting: Family Medicine

## 2019-12-17 ENCOUNTER — Ambulatory Visit (INDEPENDENT_AMBULATORY_CARE_PROVIDER_SITE_OTHER): Payer: Medicare Other | Admitting: Family Medicine

## 2019-12-17 ENCOUNTER — Other Ambulatory Visit: Payer: Self-pay

## 2019-12-17 ENCOUNTER — Telehealth: Payer: Self-pay | Admitting: Podiatry

## 2019-12-17 VITALS — BP 122/70 | HR 76 | Temp 97.6°F | Ht 65.0 in | Wt 216.8 lb

## 2019-12-17 DIAGNOSIS — Z6836 Body mass index (BMI) 36.0-36.9, adult: Secondary | ICD-10-CM | POA: Diagnosis not present

## 2019-12-17 DIAGNOSIS — G44219 Episodic tension-type headache, not intractable: Secondary | ICD-10-CM | POA: Diagnosis not present

## 2019-12-17 DIAGNOSIS — I10 Essential (primary) hypertension: Secondary | ICD-10-CM | POA: Diagnosis not present

## 2019-12-17 DIAGNOSIS — L409 Psoriasis, unspecified: Secondary | ICD-10-CM | POA: Diagnosis not present

## 2019-12-17 DIAGNOSIS — Z23 Encounter for immunization: Secondary | ICD-10-CM | POA: Diagnosis not present

## 2019-12-17 MED ORDER — NORTRIPTYLINE HCL 10 MG PO CAPS: 10.0000 mg | ORAL_CAPSULE | Freq: Every day | ORAL | 2 refills | Status: DC

## 2019-12-17 MED ORDER — LOSARTAN POTASSIUM 50 MG PO TABS: 50.0000 mg | ORAL_TABLET | Freq: Every day | ORAL | 3 refills | Status: DC

## 2019-12-17 NOTE — Patient Instructions (Addendum)
Nice to see you. We will change your blood pressure medicine to losartan.  We will see you back in 1 week for labs.  Also see you back in 4 to 6 weeks to recheck your blood pressure. We are starting you on nortriptyline to help with your headaches.  If you notice any drowsiness, dry mouth, constipation, confusion, or other side effects please let us know.

## 2019-12-17 NOTE — Progress Notes (Signed)
Tommi Rumps, MD Phone: [phone]  Alexandra Bishop is a 74 y.o. female who presents today for follow-up.  Hypertension: Not checking blood pressures.  Taking amlodipine.  No chest pain or shortness of breath.  Does have some chronic ankle edema left greater than right.  Psoriasis: Patient continues to have issues with this on her scalp.  She follows with dermatology and she has been using topical steroids.  She notes the dermatologist suggested she start on [place] and she has submitted her patient assistance paperwork to them.  Headaches: These are continuing to be an issue.  Have been ongoing since June.  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  She has 2 types of headaches.  One occurs most days with an achy sensation behind her eyes.  No photophobia or phonophobia.  She also has sharper headaches that occur 1-2 times a month in her right parietal area.  Those last very briefly and resolve on their own.  Obesity: Patient notes she has been restricting her calorie intake.  Typically has 1 egg and an [place] muffin or [place] toast for breakfast.  Has yogurt for lunch.  Has a small dinner each night.  She is not sure she is getting 1200 cal a day.  She is trying to walk for exercise.  Patient wonders if she should get the Covid booster.  Social History   Tobacco Use  Smoking Status Never Smoker  Smokeless Tobacco Never Used  Tobacco Comment   tobacco use - no      ROS see history of present illness  Objective  Physical Exam Vitals:   12/17/19 1034  BP: 122/70  Pulse: 76  Temp: 97.6 F (36.4 C)  SpO2: 96%    BP Readings from Last 3 Encounters:  12/17/19 122/70  10/11/19 106/68  09/12/19 122/80   Wt Readings from Last 3 Encounters:  12/17/19 216 lb 12.8 oz (98.3 kg)  10/11/19 (!) 218 lb (98.9 kg)  10/02/19 212 lb (96.2 kg)    Physical  Exam Constitutional:      General: She is not in acute distress.    Appearance: She is not diaphoretic.  Cardiovascular:     Rate and Rhythm: Normal rate and regular rhythm.     Heart sounds: Normal heart sounds.  Pulmonary:     Effort: Pulmonary effort is normal.     Breath sounds: Normal breath sounds.  Skin:    General: Skin is warm and dry.  Neurological:     Mental Status: She is alert.     Comments: PERRL, EOMI, hearing intact to finger rub, facial sensation intact, eyes open and close adequately, shoulder shrug intact, 5/5 strength in bilateral biceps, triceps, grip, quads, hamstrings, plantar and dorsiflexion, sensation to light touch intact in bilateral UE and LE, normal gait      Assessment/Plan: Please see individual problem list.  Hypertension At goal.  She reports some mild swelling at times.  We will change the amlodipine to losartan 50 mg once daily.  She will have follow-up labs in 1 week.  Episodic tension-type headache, not intractable Chronic issues with headaches.  It sounds like she is also having trigeminal neurology issues though those are much less frequently.  Has followed up with neurosurgery and notes they advised there is nothing to do at this time for the epidermoid cyst.  We will treat her tension  type headaches with nortriptyline.  Discussed risk of drowsiness, constipation, dry mouth, and confusion with this.  She has any side effects she will let us know right away.  Psoriasis She will continue to see her dermatologist.  I encouraged her to follow-up with them regarding the [facility].  Obesity Discussed walking for exercise.  I encouraged her to increase her calorie intake to around 1500 cal a day.  I suspect she is not getting enough calories.   Health Maintenance: I encouraged her to get the COVID-19 booster based on the CDC guidelines.  Discussed it may be beneficial to get this before she starts on any specific treatment for her psoriasis.  Orders  Placed This Encounter  Procedures  . Flu Vaccine QUAD High Dose(Fluad)  . Basic Metabolic Panel (BMET)    Standing Status:   Future    Standing Expiration Date:   12/16/2020    Meds ordered this encounter  Medications  . losartan (COZAAR) 50 MG tablet    Sig: Take 1 tablet (50 mg total) by mouth daily.    Dispense:  90 tablet    Refill:  3  . nortriptyline (PAMELOR) 10 MG capsule    Sig: Take 1 capsule (10 mg total) by mouth at bedtime.    Dispense:  30 capsule    Refill:  2    Alexandra Bishop was seen today for follow-up.  Diagnoses and all orders for this visit:  Need for immunization against influenza -     Flu Vaccine QUAD High Dose(Fluad)  Essential hypertension -     losartan (COZAAR) 50 MG tablet; Take 1 tablet (50 mg total) by mouth daily. -     Basic Metabolic Panel (BMET); Future  Episodic tension-type headache, not intractable -     nortriptyline (PAMELOR) 10 MG capsule; Take 1 capsule (10 mg total) by mouth at bedtime.  Psoriasis  Class 2 severe obesity due to excess calories with serious comorbidity and body mass index (BMI) of 36.0 to 36.9 in adult [facility])     This visit occurred during the SARS-CoV-2 public health emergency.  Safety protocols were in place, including screening questions prior to the visit, additional usage of staff PPE, and extensive cleaning of exam room while observing appropriate contact time as indicated for disinfecting solutions.    Tommi Rumps, MD [place]

## 2019-12-17 NOTE — Assessment & Plan Note (Signed)
Discussed walking for exercise.  I encouraged her to increase her calorie intake to around 1500 cal a day.  I suspect she is not getting enough calories.

## 2019-12-17 NOTE — Assessment & Plan Note (Signed)
Chronic issues with headaches.  It sounds like she is also having trigeminal neurology issues though those are much less frequently.  Has followed up with neurosurgery and notes they advised there is nothing to do at this time for the epidermoid cyst.  We will treat her tension type headaches with nortriptyline.  Discussed risk of drowsiness, constipation, dry mouth, and confusion with this.  She has any side effects she will let us know right away.

## 2019-12-17 NOTE — Assessment & Plan Note (Signed)
At goal.  She reports some mild swelling at times.  We will change the amlodipine to losartan 50 mg once daily.  She will have follow-up labs in 1 week.

## 2019-12-17 NOTE — Assessment & Plan Note (Signed)
She will continue to see her dermatologist.  I encouraged her to follow-up with them regarding the [facility].

## 2019-12-17 NOTE — Telephone Encounter (Signed)
Pt left message stating she has some unhappy looking orthotics and would like a call back.  I returned the call and explained that if she would like them refurbished she can drop them off at the [place] office and pay the 90.00 fee and we would call pt when they come back in to pick them up. No appt is needed. She is concerned about what to do if she does this, what is she to wear. I explained that if we were to order a new pair they are 438.00. She is going to think about it.

## 2019-12-24 ENCOUNTER — Other Ambulatory Visit (INDEPENDENT_AMBULATORY_CARE_PROVIDER_SITE_OTHER): Payer: Medicare Other

## 2019-12-24 ENCOUNTER — Telehealth: Payer: Self-pay | Admitting: *Deleted

## 2019-12-24 ENCOUNTER — Other Ambulatory Visit: Payer: Self-pay

## 2019-12-24 ENCOUNTER — Other Ambulatory Visit: Payer: Medicare Other

## 2019-12-24 DIAGNOSIS — I10 Essential (primary) hypertension: Secondary | ICD-10-CM | POA: Diagnosis not present

## 2019-12-24 NOTE — Telephone Encounter (Signed)
Pt came in for labs today & asked to speak to a nurse. When told that none where available she proceeded to tell me what she needed. She stated that she changed her dermatology from Dr. Phillip Heal to [facility]. But when she asked for refills she needed to be seen & wants to know if you would refill her Ketoconazole shampoo & Fluocinonide topical solution. She also want to know if we could help her lower the cost on the second one.  Se also gave me a copy of her vaccine card (I have updated in her chart) & a copy of the [place] approval (placed in provider box in pod).  I told her she would more than likely need to follow up with her new dermatologist but I would see if her PCP would be willing to refill them.

## 2019-12-25 LAB — BASIC METABOLIC PANEL
BUN: 14 mg/dL (ref 6–23)
CO2: 27 mEq/L (ref 19–32)
Calcium: 9.6 mg/dL (ref 8.4–10.5)
Chloride: 103 mEq/L (ref 96–112)
Creatinine, Ser: 0.79 mg/dL (ref 0.40–1.20)
GFR: 70.69 mL/min (ref >60.00)
Glucose, Bld: 89 mg/dL (ref 70–99)
Potassium: 4.4 mEq/L (ref 3.5–5.1)
Sodium: 139 mEq/L (ref 135–145)

## 2019-12-26 ENCOUNTER — Other Ambulatory Visit: Payer: Self-pay

## 2019-12-26 ENCOUNTER — Encounter: Payer: Self-pay | Admitting: Podiatry

## 2019-12-26 ENCOUNTER — Ambulatory Visit (INDEPENDENT_AMBULATORY_CARE_PROVIDER_SITE_OTHER): Payer: Medicare Other | Admitting: Podiatry

## 2019-12-26 DIAGNOSIS — Q666 Other congenital valgus deformities of feet: Secondary | ICD-10-CM

## 2019-12-26 DIAGNOSIS — M722 Plantar fascial fibromatosis: Secondary | ICD-10-CM

## 2019-12-26 NOTE — Progress Notes (Signed)
She presents today states that she needs new orthotics and that her ankle is doing just great.  Objective: Vital signs are stable alert oriented x3 she has no pain on palpation of the ankle pulses are palpable.  She has no plantar fasciitis pain.  Assessment: History of ankle osteoarthritis pes planovalgus and plantar fasciitis.  Plan: At this point we will go to make her a new set of orthotics and then we will recover the old ones once the new ones come in.

## 2019-12-27 NOTE — Telephone Encounter (Signed)
I called and spoke with the patient and informed her to call [facility] dermatology for refills and she stated she was going to their office tomorrow and take her bottles to get refills.  Melina Mosteller,cma

## 2019-12-27 NOTE — Telephone Encounter (Signed)
It looks like she just saw [facility] dermatology in August.  She should contact them for these refills as they should be able to refill them for her and may be able to help with the cost of the fluocinonide topical solution.

## 2020-01-01 ENCOUNTER — Telehealth: Payer: Self-pay

## 2020-01-01 NOTE — Telephone Encounter (Signed)
Application for [employer] was approved and it ends 03/21/2021 patient has been notified.  Shine Scrogham,cma

## 2020-01-09 ENCOUNTER — Ambulatory Visit (INDEPENDENT_AMBULATORY_CARE_PROVIDER_SITE_OTHER): Payer: Medicare Other | Admitting: Podiatry

## 2020-01-09 ENCOUNTER — Other Ambulatory Visit: Payer: Self-pay | Admitting: Family Medicine

## 2020-01-09 ENCOUNTER — Encounter: Payer: Self-pay | Admitting: Podiatry

## 2020-01-09 ENCOUNTER — Other Ambulatory Visit: Payer: Self-pay

## 2020-01-09 ENCOUNTER — Ambulatory Visit (INDEPENDENT_AMBULATORY_CARE_PROVIDER_SITE_OTHER): Payer: Medicare Other

## 2020-01-09 DIAGNOSIS — S99922A Unspecified injury of left foot, initial encounter: Secondary | ICD-10-CM

## 2020-01-09 DIAGNOSIS — S92025A Nondisplaced fracture of anterior process of left calcaneus, initial encounter for closed fracture: Secondary | ICD-10-CM

## 2020-01-09 DIAGNOSIS — G44219 Episodic tension-type headache, not intractable: Secondary | ICD-10-CM

## 2020-01-09 NOTE — Progress Notes (Signed)
  Subjective:  Patient ID: Alexandra Bishop, female    DOB: 02-Jul-1945,  MRN: [id number]  Chief Complaint  Patient presents with  . Foot Pain    Patient presents today for injury to left foot   she states "I tripped and fell yesterday and twisted my foot.  It hurts on the top today and feels a little better than yesterday"    74 y.o. female presents with the above complaint. History confirmed with patient.   Objective:  Physical Exam: warm, good capillary refill, no trophic changes or ulcerative lesions, normal DP and PT pulses and normal sensory exam. Left Foot: Sharp pain on palpation to the anterior calcaneus at the calcaneocuboid joint.  No pain along fifth metatarsal, fibula or peroneals.  No instability.  No proximal leg pain.  No medial pain.   Radiographs: X-ray of the left foot: There is an avulsion fracture of the anterior process of the calcaneus.  Tailor's bunion present. Assessment:   1. Foot injury, left, initial encounter   2. Nondisplaced fracture of anterior process of left calcaneus, initial encounter for closed fracture      Plan:  Patient was evaluated and treated and all questions answered.  Discussed with her that we should plan for nonoperative management of this injury.  She states that it is already feeling much better than it did yesterday and she is able to put full weight on it in a regular shoe.  We discussed immobilizing this in a CAM boot, however I think she would prefer to stay in a stabilizing compressive brace.  She had this today, and we will continue to use this.  If it worsens or does not improve will consider CAM boot immobilization.  No follow-ups on file.

## 2020-01-10 ENCOUNTER — Other Ambulatory Visit: Payer: Self-pay | Admitting: Podiatry

## 2020-01-10 DIAGNOSIS — S92025A Nondisplaced fracture of anterior process of left calcaneus, initial encounter for closed fracture: Secondary | ICD-10-CM

## 2020-01-15 ENCOUNTER — Telehealth: Payer: Self-pay

## 2020-01-15 NOTE — Telephone Encounter (Signed)
Pt called and stated that she is going to discontinue the use of her boot and would like to alert the provider as well as get further instruction. Appt offered but pt declined. Please advise.

## 2020-01-16 DIAGNOSIS — H2513 Age-related nuclear cataract, bilateral: Secondary | ICD-10-CM | POA: Diagnosis not present

## 2020-01-23 ENCOUNTER — Ambulatory Visit: Payer: Medicare Other | Admitting: Orthotics

## 2020-01-23 ENCOUNTER — Other Ambulatory Visit: Payer: Self-pay

## 2020-01-23 DIAGNOSIS — M722 Plantar fascial fibromatosis: Secondary | ICD-10-CM

## 2020-01-23 DIAGNOSIS — S99922A Unspecified injury of left foot, initial encounter: Secondary | ICD-10-CM

## 2020-01-23 NOTE — Progress Notes (Signed)
Sending back to [employer] as they didn't make full length.

## 2020-01-30 ENCOUNTER — Ambulatory Visit (INDEPENDENT_AMBULATORY_CARE_PROVIDER_SITE_OTHER): Payer: Medicare Other | Admitting: Family Medicine

## 2020-01-30 ENCOUNTER — Other Ambulatory Visit: Payer: Self-pay

## 2020-01-30 ENCOUNTER — Encounter: Payer: Self-pay | Admitting: Family Medicine

## 2020-01-30 ENCOUNTER — Ambulatory Visit (INDEPENDENT_AMBULATORY_CARE_PROVIDER_SITE_OTHER): Payer: Medicare Other

## 2020-01-30 VITALS — BP 120/80 | HR 85 | Temp 97.6°F | Ht 65.0 in | Wt 211.8 lb

## 2020-01-30 DIAGNOSIS — I10 Essential (primary) hypertension: Secondary | ICD-10-CM | POA: Diagnosis not present

## 2020-01-30 DIAGNOSIS — G44219 Episodic tension-type headache, not intractable: Secondary | ICD-10-CM | POA: Diagnosis not present

## 2020-01-30 DIAGNOSIS — S92009A Unspecified fracture of unspecified calcaneus, initial encounter for closed fracture: Secondary | ICD-10-CM

## 2020-01-30 DIAGNOSIS — M25531 Pain in right wrist: Secondary | ICD-10-CM

## 2020-01-30 DIAGNOSIS — M7989 Other specified soft tissue disorders: Secondary | ICD-10-CM | POA: Diagnosis not present

## 2020-01-30 HISTORY — DX: Unspecified fracture of unspecified calcaneus, initial encounter for closed fracture: S92.009A

## 2020-01-30 MED ORDER — NORTRIPTYLINE HCL 10 MG PO CAPS: 20.0000 mg | ORAL_CAPSULE | Freq: Every day | ORAL | 1 refills | Status: DC

## 2020-01-30 NOTE — Patient Instructions (Signed)
Nice to see you. Please wear your boot for your heel fracture. We will get an x-ray of your wrist. Please stop using Tylenol and other over-the-counter headache treatments.  We will increase her nortriptyline to 20 mg daily.  If you develop drowsiness, constipation, dry mouth, or other side effects please let us know.

## 2020-01-30 NOTE — Assessment & Plan Note (Signed)
Well-controlled.  Continue losartan 50 mg once daily.

## 2020-01-30 NOTE — Progress Notes (Signed)
Tommi Rumps, MD Phone: [phone]  Alexandra Bishop is a 74 y.o. female who presents today for follow-up.  Hypertension: Not checking blood pressures at home.  Taking losartan 50 mg once daily.  No chest pain, shortness of edema.  Chronic tension headaches: This continues to be an issue.  She is taking multiple doses of Tylenol per day.  She is taking nortriptyline 10 mg once daily.  She has noted no benefit with this.  No side effects with this.  Occasional trigeminal neuralgia type headaches.  Left heel fracture: Patient reports she was diagnosed with a hairline fracture through podiatry.  She turned her ankle while she was walking.  She is supposed to be in a boot though is not wearing this as she does not like it.  She has follow-up with the podiatrist on December 1.  Right wrist pain: This is on the radial aspect.  She has a puffy area on her wrist that is somewhat tender at times.  Its been present for the last year.  Social History   Tobacco Use  Smoking Status Never Smoker  Smokeless Tobacco Never Used  Tobacco Comment   tobacco use - no      ROS see history of present illness  Objective  Physical Exam Vitals:   01/30/20 1504  BP: 120/80  Pulse: 85  Temp: 97.6 F (36.4 C)  SpO2: 94%    BP Readings from Last 3 Encounters:  01/30/20 120/80  12/17/19 122/70  10/11/19 106/68   Wt Readings from Last 3 Encounters:  01/30/20 211 lb 12.8 oz (96.1 kg)  12/17/19 216 lb 12.8 oz (98.3 kg)  10/11/19 (!) 218 lb (98.9 kg)    Physical Exam Constitutional:      General: She is not in acute distress.    Appearance: She is not diaphoretic.  Cardiovascular:     Rate and Rhythm: Normal rate and regular rhythm.     Heart sounds: Normal heart sounds.  Pulmonary:     Effort: Pulmonary effort is normal.     Breath sounds: Normal breath sounds.  Musculoskeletal:       Arms:  Skin:    General: Skin is warm and dry.  Neurological:     Mental Status: She is alert.      Comments: 5/5 strength in bilateral biceps, triceps, grip, quads, hamstrings, plantar and dorsiflexion, sensation to light touch intact in bilateral UE and LE       Assessment/Plan: Please see individual problem list.  Problem List Items Addressed This Visit    Episodic tension-type headache, not intractable    I suspect she is having medication overuse headaches.  I advised that she discontinue over-the-counter headache medication.  We will increase her nortriptyline to 20 mg once daily.  She will monitor for side effects as outlined in her AVS and she will let us know if they occur.  Follow-up in 6 weeks.      Relevant Medications   nortriptyline (PAMELOR) 10 MG capsule   Heel fracture    She is seeing podiatry for this.  I advised her to wear her boot as they advised her.  Discussed risk of nonhealing and pain without appropriately wearing the boot.  She will follow up with podiatry as planned.      Hypertension    Well-controlled.  Continue losartan 50 mg once daily.      Right wrist pain - Primary    Possibly a cyst versus arthritic issues.  We will get an  x-ray to start.      Relevant Orders   DG Wrist Complete Right      This visit occurred during the SARS-CoV-2 public health emergency.  Safety protocols were in place, including screening questions prior to the visit, additional usage of staff PPE, and extensive cleaning of exam room while observing appropriate contact time as indicated for disinfecting solutions.    Tommi Rumps, MD [place]

## 2020-01-30 NOTE — Assessment & Plan Note (Signed)
She is seeing podiatry for this.  I advised her to wear her boot as they advised her.  Discussed risk of nonhealing and pain without appropriately wearing the boot.  She will follow up with podiatry as planned.

## 2020-01-30 NOTE — Assessment & Plan Note (Signed)
I suspect she is having medication overuse headaches.  I advised that she discontinue over-the-counter headache medication.  We will increase her nortriptyline to 20 mg once daily.  She will monitor for side effects as outlined in her AVS and she will let us know if they occur.  Follow-up in 6 weeks.

## 2020-01-30 NOTE — Assessment & Plan Note (Signed)
Possibly a cyst versus arthritic issues.  We will get an x-ray to start.

## 2020-01-31 ENCOUNTER — Telehealth: Payer: Self-pay | Admitting: Family Medicine

## 2020-01-31 NOTE — Telephone Encounter (Signed)
I called and spoke with the patient and she stated she stopped taking the amlodipine and the Lipitor I informed her that I did not see the amlodipine on her current list but she needed to take the Lipitor and she understood and stated she would start back taking it.  Ragen Laver,cma

## 2020-01-31 NOTE — Telephone Encounter (Signed)
Patient has some questions on medication she may have discontinued by mistake. Please call.

## 2020-02-06 ENCOUNTER — Other Ambulatory Visit: Payer: Self-pay | Admitting: Family Medicine

## 2020-02-06 DIAGNOSIS — M25539 Pain in unspecified wrist: Secondary | ICD-10-CM

## 2020-02-19 ENCOUNTER — Other Ambulatory Visit: Payer: Self-pay | Admitting: Family Medicine

## 2020-02-20 ENCOUNTER — Encounter: Payer: Self-pay | Admitting: Podiatry

## 2020-02-20 ENCOUNTER — Encounter: Payer: Medicare Other | Admitting: Orthotics

## 2020-02-20 ENCOUNTER — Ambulatory Visit (INDEPENDENT_AMBULATORY_CARE_PROVIDER_SITE_OTHER): Payer: Medicare Other

## 2020-02-20 ENCOUNTER — Other Ambulatory Visit: Payer: Self-pay

## 2020-02-20 ENCOUNTER — Ambulatory Visit (INDEPENDENT_AMBULATORY_CARE_PROVIDER_SITE_OTHER): Payer: Medicare Other | Admitting: Podiatry

## 2020-02-20 DIAGNOSIS — M19171 Post-traumatic osteoarthritis, right ankle and foot: Secondary | ICD-10-CM | POA: Diagnosis not present

## 2020-02-20 DIAGNOSIS — S92025A Nondisplaced fracture of anterior process of left calcaneus, initial encounter for closed fracture: Secondary | ICD-10-CM | POA: Diagnosis not present

## 2020-02-20 NOTE — Progress Notes (Signed)
  Subjective:  Patient ID: Alexandra Bishop, female    DOB: June 18, 1945,  MRN: [id number]  Chief Complaint  Patient presents with  . Fracture    "its doing much better"    74 y.o. female returns with the above complaint. History confirmed with patient. Has been wearing the CAM boot which has helped tremendously. She requests an x-ray of her right ankle which she fractured 20 years ago.  Objective:  Physical Exam: warm, good capillary refill, no trophic changes or ulcerative lesions, normal DP and PT pulses and normal sensory exam. Left Foot: No pain of anterior process of the calcaneus Right ankle: palpable hardware, minimal ankle ROM, painful   Radiographs: X-ray of the left foot: avulsion fracture still present, well corticated Right ankle: post traumatic arthritis of the ankle joint with intact hardware, degenerative changes of the subtalar joint  Assessment:   1. Nondisplaced fracture of anterior process of left calcaneus, initial encounter for closed fracture   2. Post-traumatic arthritis of right ankle      Plan:  Patient was evaluated and treated and all questions answered.  Can wean CAM boot  For the right ankle it is not particularly bothersome for her. We discussed treatment of ankle arthritis including bracing and injections as well as arthrodesis vs total ankle replacement if that fails. For now, continue regular activities and continue to monitor.   Return if symptoms worsen or fail to improve.

## 2020-02-24 ENCOUNTER — Other Ambulatory Visit: Payer: Self-pay | Admitting: Family Medicine

## 2020-02-24 DIAGNOSIS — G44219 Episodic tension-type headache, not intractable: Secondary | ICD-10-CM

## 2020-02-25 ENCOUNTER — Telehealth: Payer: Self-pay | Admitting: Family Medicine

## 2020-02-25 DIAGNOSIS — G44219 Episodic tension-type headache, not intractable: Secondary | ICD-10-CM

## 2020-02-25 NOTE — Telephone Encounter (Signed)
Patient called in stated that she is still having the headaches and she is taking the 20 mg and wanted to know if she could get a higher dose and also wanted to know why she is taking thelosartan (COZAAR) 50 MG tablet.

## 2020-02-25 NOTE — Telephone Encounter (Signed)
Patient stating that the nortriptyline is not working for her headaches. Please advise.  Ephraim Reichel,cma

## 2020-02-26 DIAGNOSIS — M25579 Pain in unspecified ankle and joints of unspecified foot: Secondary | ICD-10-CM | POA: Diagnosis not present

## 2020-02-26 DIAGNOSIS — M654 Radial styloid tenosynovitis [de Quervain]: Secondary | ICD-10-CM | POA: Diagnosis not present

## 2020-02-26 NOTE — Telephone Encounter (Signed)
Noted. Has she had any side effects from the nortriptyline? If she has not had any side effects then we could increase the dose to 30 mg nightly to see if that is helpful. She is on the losartan for her blood pressure.

## 2020-02-26 NOTE — Telephone Encounter (Signed)
I called and spoke with the patient and asked if she was having any side effects of the medication nortriptyline and she stated she was not and she was okay with the increase of 30 mg.  Allean Montfort,cma

## 2020-02-27 MED ORDER — NORTRIPTYLINE HCL 10 MG PO CAPS: 30.0000 mg | ORAL_CAPSULE | Freq: Every day | ORAL | 1 refills | Status: DC

## 2020-02-27 NOTE — Telephone Encounter (Signed)
Sent to pharmacy 

## 2020-03-06 ENCOUNTER — Ambulatory Visit (INDEPENDENT_AMBULATORY_CARE_PROVIDER_SITE_OTHER): Payer: Medicare Other

## 2020-03-06 VITALS — Ht 65.0 in | Wt 211.0 lb

## 2020-03-06 DIAGNOSIS — Z0000 Encounter for general adult medical examination without abnormal findings: Secondary | ICD-10-CM

## 2020-03-06 NOTE — Progress Notes (Signed)
Subjective:   Danyia Borunda is a 74 y.o. female who presents for Medicare Annual (Subsequent) preventive examination.  Review of Systems    No ROS.  Medicare Wellness Virtual Visit.    Cardiac Risk Factors include: advanced age (>39mn, >>51women);hypertension     Objective:    Today's Vitals   03/06/20 1040  Weight: 211 lb (95.7 kg)  Height: 5' 5"  (1.651 m)   Body mass index is 35.11 kg/m.  Advanced Directives 03/06/2020 03/06/2019 01/11/2018 01/10/2017 09/13/2016 01/08/2016 01/07/2015  Does Patient Have a Medical Advance Directive? No Yes No No No No Yes  Type of Advance Directive - HCovingtonLiving will - - - - HPress photographerLiving will  Does patient want to make changes to medical advance directive? - No - Patient declined Yes (MAU/Ambulatory/Procedural Areas - Information given) No - Patient declined - - No - Patient declined  Copy of [place] Chart? - No - copy requested - - - - No - copy requested  Would patient like information on creating a medical advance directive? No - Patient declined - - - - Yes - Educational materials given -    Current Medications (verified) Outpatient Encounter Medications as of 03/06/2020  Medication Sig  . Apremilast (OTEZLA) 10 & 20 & 30 MG TBPK Take by mouth as directed on package.  .Marland Kitchenaspirin EC 81 MG tablet Take 81 mg by mouth every other day.  .Marland Kitchenatorvastatin (LIPITOR) 20 MG tablet TAKE ONE TABLET BY MOUTH DAILY  . CALCIUM-MAGNESIUM-ZINC PO Take by mouth. Twice weekly  . clobetasol cream (TEMOVATE) 0.05 % Apply topically.  . clotrimazole (LOTRIMIN) 1 % cream Apply topically.  . cyanocobalamin 500 MCG tablet Take 500 mcg by mouth once a week.  . EUTHYROX 100 MCG tablet Take 1 tablet by mouth once daily  . fluocinonide (LIDEX) 0.05 % external solution Apply topically.  . gabapentin (NEURONTIN) 100 MG capsule Take 1 capsule (100 mg total) by mouth 3 (three) times daily.  .Marland Kitchen ketoconazole (NIZORAL) 2 % shampoo   . losartan (COZAAR) 50 MG tablet Take 1 tablet (50 mg total) by mouth daily.  . Multiple Vitamin (MULTIVITAMIN) tablet Take 1 tablet by mouth every other day.  . nortriptyline (PAMELOR) 10 MG capsule Take 3 capsules (30 mg total) by mouth at bedtime.  . Potassium Gluconate 595 MG CAPS Take by mouth every other day.   No facility-administered encounter medications on file as of 03/06/2020.    Allergies (verified) Cymbalta [duloxetine hcl], Duloxetine, Hydrocodone, Shellfish allergy, and Tramadol   History: Past Medical History:  Diagnosis Date  . Chronic pain of right ankle 11/09/2017  . Hypothyroidism    Past Surgical History:  Procedure Laterality Date  . BREAST BIOPSY Right    neg  . broken ankle - RT    . BUNIONECTOMY  2015   Family History  Problem Relation Age of Onset  . Cancer Mother        sarcoma  . Heart disease Brother   . Cancer Maternal Grandmother        breast  . Breast cancer Maternal Grandmother   . Cancer Paternal Grandmother        breast  . Cancer Other        family hx  . Thyroid disease Other        family hx  . Thyroid cancer Neg Hx    Social History   Socioeconomic History  . Marital status:  Married    Spouse name: Not on file  . Number of children: Not on file  . Years of education: Not on file  . Highest education level: Not on file  Occupational History  . Not on file  Tobacco Use  . Smoking status: Never Smoker  . Smokeless tobacco: Never Used  . Tobacco comment: tobacco use - no   Vaping Use  . Vaping Use: Never used  Substance and Sexual Activity  . Alcohol use: No  . Drug use: No  . Sexual activity: Never  Other Topics Concern  . Not on file  Social History Narrative   From [place] originally. Followed daughter to [employer] and son in [place].    Married; retired - office work; gets regular exercise - walks a mile most days. [place]. Husband and her are following Paleo diet.     Social Determinants of Health   Financial Resource Strain: Low Risk   . Difficulty of Paying Living Expenses: Not hard at all  Food Insecurity: No Food Insecurity  . Worried About Charity fundraiser in the Last Year: Never true  . Ran Out of Food in the Last Year: Never true  Transportation Needs: No Transportation Needs  . Lack of Transportation (Medical): No  . Lack of Transportation (Non-Medical): No  Physical Activity: Not on file  Stress: No Stress Concern Present  . Feeling of Stress : Not at all  Social Connections: Unknown  . Frequency of Communication with Friends and Family: Not on file  . Frequency of Social Gatherings with Friends and Family: Not on file  . Attends Religious Services: Not on file  . Active Member of Clubs or Organizations: Not on file  . Attends Archivist Meetings: Not on file  . Marital Status: Married    Tobacco Counseling Counseling given: Not Answered Comment: tobacco use - no    Clinical Intake:  Pre-visit preparation completed: Yes        Diabetes: No  How often do you need to have someone help you when you read instructions, pamphlets, or other written materials from your doctor or pharmacy?: 1 - Never   Interpreter Needed?: No      Activities of Daily Living In your present state of health, do you have any difficulty performing the following activities: 03/06/2020  Hearing? N  Vision? N  Difficulty concentrating or making decisions? N  Walking or climbing stairs? Y  Comment Paces self  Dressing or bathing? N  Doing errands, shopping? N  Preparing Food and eating ? N  Using the Toilet? N  In the past six months, have you accidently leaked urine? N  Do you have problems with loss of bowel control? N  Managing your Medications? N  Managing your Finances? Y  Comment Husband manages  Housekeeping or managing your Housekeeping? N  Some recent data might be hidden    Patient Care Team: Leone Haven,  MD as PCP - General (Family Medicine)  Indicate any recent Medical Services you may have received from other than [facility] providers in the past year (date may be approximate).     Assessment:   This is a routine wellness examination for Vendetta.  I connected with Delphine today by telephone and verified that I am speaking with the correct person using two identifiers. Location patient: home Location provider: work Persons participating in the virtual visit: patient, Marine scientist.    I discussed the limitations, risks, security and privacy concerns of  performing an evaluation and management service by telephone and the availability of in person appointments. The patient expressed understanding and verbally consented to this telephonic visit.    Interactive audio and video telecommunications were attempted between this provider and patient, however failed, due to patient having technical difficulties OR patient did not have access to video capability.  We continued and completed visit with audio only.  Some vital signs may be absent or patient reported.   Hearing/Vision screen  Hearing Screening   125Hz  250Hz  500Hz  1000Hz  2000Hz  3000Hz  4000Hz  6000Hz  8000Hz   Right ear:           Left ear:           Comments: Patient is able to hear conversational tones without difficulty.  No issues reported.  Vision Screening Comments: Followed by [facility]  Wears corrective lenses  Visits every 6 months   Dietary issues and exercise activities discussed: Current Exercise Habits: The patient does not participate in regular exercise at present  Healthy soft diet Good water intake  Goals    . Follow up with Primary Care Provider     As needed      Depression Screen PHQ 2/9 Scores 03/06/2020 12/17/2019 06/11/2019 03/06/2019 03/02/2019 11/06/2018 09/06/2018  PHQ - 2 Score 0 0 0 0 0 0 3  PHQ- 9 Score - - - - - - 6    Fall Risk Fall Risk  03/06/2020 12/17/2019 10/02/2019 06/11/2019 03/06/2019  Falls in  the past year? 1 0 0 0 0  Comment - - - - -  Number falls in past yr: 1 0 - 0 -  Comment - - - - -  Injury with Fall? 1 - - - -  Comment - - - - -  Risk for fall due to : - - - - -  Follow up Falls evaluation completed Falls evaluation completed Falls evaluation completed - Falls prevention discussed    FALL RISK PREVENTION PERTAINING TO THE HOME: Handrails in use when climbing stairs? Yes Home free of loose throw rugs in walkways, pet beds, electrical cords, etc? Yes  Adequate lighting in your home to reduce risk of falls? Yes   ASSISTIVE DEVICES UTILIZED TO PREVENT FALLS: Use of a cane, walker or w/c? No   TIMED UP AND GO: Was the test performed? No . Virtual visit.   Cognitive Function: MMSE - Mini Mental State Exam 01/10/2017 01/08/2016 01/07/2015  Orientation to time 5 5 5   Orientation to Place 5 5 5   Registration 3 3 3   Attention/ Calculation 5 5 5   Recall 3 3 3   Language- name 2 objects 2 2 2   Language- repeat 1 1 1   Language- follow 3 step command 3 3 3   Language- read & follow direction 1 1 1   Write a sentence 1 1 1   Copy design 1 1 1   Total score 30 30 30      6CIT Screen 03/06/2020 03/06/2019 01/11/2018  What Year? 0 points 0 points 0 points  What month? 0 points 0 points 0 points  What time? 0 points 0 points 0 points  Count back from 20 0 points 0 points 0 points  Months in reverse 0 points 0 points 0 points  Repeat phrase 4 points 0 points 0 points  Total Score 4 0 0    Immunizations Immunization History  Administered Date(s) Administered  . Fluad Quad(high Dose 65+) 12/01/2018, 12/17/2019  . Influenza Split 01/04/2011, 11/24/2011, 12/13/2013  . Influenza, High  Dose Seasonal PF 12/24/2016  . Influenza,inj,Quad PF,6+ Mos 11/12/2015  . Influenza,inj,quad, With Preservative 11/12/2015  . Influenza-Unspecified 01/03/2013, 12/23/2014, 12/24/2016, 11/08/2017, 12/12/2017  . PFIZER SARS-COV-2 Vaccination 04/09/2019, 04/30/2019, 12/21/2019  . Pneumococcal  Conjugate-13 12/03/2013, 10/18/2016  . Pneumococcal Polysaccharide-23 07/29/2009, 11/08/2017  . Tdap 01/12/2008, 08/11/2015  . Zoster 11/24/2011   Health Maintenance There are no preventive care reminders to display for this patient.  Health Maintenance  Topic Date Due  . COVID-19 Vaccine (4 - Booster for Pfizer series) 06/20/2020  . TETANUS/TDAP  08/10/2025  . INFLUENZA VACCINE  Completed  . DEXA SCAN  Completed  . Hepatitis C Screening  Completed   Colonoscopy- Up-to-date on colonoscopy, last done 04/2011 and showed polpys (negative for malignancy) diverticulosis and hemorrhoids.    Mammogram status: Completed 04/12/19. Repeat every year. TOMO Bilateral.  Dexa Scan- 03/30/16. Deferred for follow up with PCP per preference.   Lung Cancer Screening: (Low Dose CT Chest recommended if Age 62-80 years, 30 pack-year currently smoking OR have quit w/in 15years.) does not qualify.   Hepatitis C Screening: Completed 11/12/15.   Vision Screening: Recommended annual ophthalmology exams for early detection of glaucoma and other disorders of the eye. Is the patient up to date with their annual eye exam?  Yes  Who is the provider or what is the name of the office in which the patient attends annual eye exams?  [facility]. Visits every 6 months.   Dental Screening: Recommended annual dental exams for proper oral hygiene.  Community Resource Referral / Chronic Care Management: CRR required this visit?  No   CCM required this visit?  No      Plan:   Keep all routine maintenance appointments.   Follow up 03/12/20 @ 2:45.   I have personally reviewed and noted the following in the patient's chart:   . Medical and social history . Use of alcohol, tobacco or illicit drugs  . Current medications and supplements . Functional ability and status . Nutritional status . Physical activity . Advanced directives . List of other physicians . Hospitalizations, surgeries, and ER visits in  previous 12 months . Vitals . Screenings to include cognitive, depression, and falls . Referrals and appointments  In addition, I have reviewed and discussed with patient certain preventive protocols, quality metrics, and best practice recommendations. A written personalized care plan for preventive services as well as general preventive health recommendations were provided to patient via mychart.     Varney Biles, LPN   03/24/7251

## 2020-03-06 NOTE — Patient Instructions (Addendum)
Alexandra Bishop , Thank you for taking time to come for your Medicare Wellness Visit. I appreciate your ongoing commitment to your health goals. Please review the following plan we discussed and let me know if I can assist you in the future.   These are the goals we discussed: Goals    . Follow up with Primary Care Provider     As needed       This is a list of the screening recommended for you and due dates:  Health Maintenance  Topic Date Due  . COVID-19 Vaccine (4 - Booster for Pfizer series) 06/20/2020  . Tetanus Vaccine  08/10/2025  . Flu Shot  Completed  . DEXA scan (bone density measurement)  Completed  .  Hepatitis C: One time screening is recommended by Center for Disease Control  (CDC) for  adults born from [birth year] through 1967.   Completed    Immunizations Immunization History  Administered Date(s) Administered  . Fluad Quad(high Dose 65+) 12/01/2018, 12/17/2019  . Influenza Split 01/04/2011, 11/24/2011, 12/13/2013  . Influenza, High Dose Seasonal PF 12/24/2016  . Influenza,inj,Quad PF,6+ Mos 11/12/2015  . Influenza,inj,quad, With Preservative 11/12/2015  . Influenza-Unspecified 01/03/2013, 12/23/2014, 12/24/2016, 11/08/2017, 12/12/2017  . PFIZER SARS-COV-2 Vaccination 04/09/2019, 04/30/2019, 12/21/2019  . Pneumococcal Conjugate-13 12/03/2013, 10/18/2016  . Pneumococcal Polysaccharide-23 07/29/2009, 11/08/2017  . Tdap 01/12/2008, 08/11/2015  . Zoster 11/24/2011   Keep all routine maintenance appointments.   Follow up 03/12/20 @ 2:45.   Advanced directives: Not yet completed. Plans to pick up a copy at next office visit.   Conditions/risks identified: None new.   Follow up in one year for your annual wellness visit.   Preventive Care 76 Years and Older, Female Preventive care refers to lifestyle choices and visits with your health care provider that can promote health and wellness. What does preventive care include?  A yearly physical exam. This is also called an  annual well check.  Dental exams once or twice a year.  Routine eye exams. Ask your health care provider how often you should have your eyes checked.  Personal lifestyle choices, including:  Daily care of your teeth and gums.  Regular physical activity.  Eating a healthy diet.  Avoiding tobacco and drug use.  Limiting alcohol use.  Practicing safe sex.  Taking low-dose aspirin every day.  Taking vitamin and mineral supplements as recommended by your health care provider. What happens during an annual well check? The services and screenings done by your health care provider during your annual well check will depend on your age, overall health, lifestyle risk factors, and family history of disease. Counseling  Your health care provider may ask you questions about your:  Alcohol use.  Tobacco use.  Drug use.  Emotional well-being.  Home and relationship well-being.  Sexual activity.  Eating habits.  History of falls.  Memory and ability to understand (cognition).  Work and work Statistician.  Reproductive health. Screening  You may have the following tests or measurements:  Height, weight, and BMI.  Blood pressure.  Lipid and cholesterol levels. These may be checked every 5 years, or more frequently if you are over 76 years old.  Skin check.  Lung cancer screening. You may have this screening every year starting at age 50 if you have a 30-pack-year history of smoking and currently smoke or have quit within the past 15 years.  Fecal occult blood test (FOBT) of the stool. You may have this test every year starting at age 7.  Flexible sigmoidoscopy or colonoscopy. You may have a sigmoidoscopy every 5 years or a colonoscopy every 10 years starting at age 12.  Hepatitis C blood test.  Hepatitis B blood test.  Sexually transmitted disease (STD) testing.  Diabetes screening. This is done by checking your blood sugar (glucose) after you have not eaten for  a while (fasting). You may have this done every 1-3 years.  Bone density scan. This is done to screen for osteoporosis. You may have this done starting at age 76.  Mammogram. This may be done every 1-2 years. Talk to your health care provider about how often you should have regular mammograms. Talk with your health care provider about your test results, treatment options, and if necessary, the need for more tests. Vaccines  Your health care provider may recommend certain vaccines, such as:  Influenza vaccine. This is recommended every year.  Tetanus, diphtheria, and acellular pertussis (Tdap, Td) vaccine. You may need a Td booster every 10 years.  Zoster vaccine. You may need this after age 76.  Pneumococcal 13-valent conjugate (PCV13) vaccine. One dose is recommended after age 76.  Pneumococcal polysaccharide (PPSV23) vaccine. One dose is recommended after age 10. Talk to your health care provider about which screenings and vaccines you need and how often you need them. This information is not intended to replace advice given to you by your health care provider. Make sure you discuss any questions you have with your health care provider. Document Released: 04/04/2015 Document Revised: 11/26/2015 Document Reviewed: 01/07/2015 Elsevier Interactive Patient Education  2017 [place] Prevention in the Home Falls can cause injuries. They can happen to people of all ages. There are many things you can do to make your home safe and to help prevent falls. What can I do on the outside of my home?  Regularly fix the edges of walkways and driveways and fix any cracks.  Remove anything that might make you trip as you walk through a door, such as a raised step or threshold.  Trim any bushes or trees on the path to your home.  Use bright outdoor lighting.  Clear any walking paths of anything that might make someone trip, such as rocks or tools.  Regularly check to see if handrails  are loose or broken. Make sure that both sides of any steps have handrails.  Any raised decks and porches should have guardrails on the edges.  Have any leaves, snow, or ice cleared regularly.  Use sand or salt on walking paths during winter.  Clean up any spills in your garage right away. This includes oil or grease spills. What can I do in the bathroom?  Use night lights.  Install grab bars by the toilet and in the tub and shower. Do not use towel bars as grab bars.  Use non-skid mats or decals in the tub or shower.  If you need to sit down in the shower, use a plastic, non-slip stool.  Keep the floor dry. Clean up any water that spills on the floor as soon as it happens.  Remove soap buildup in the tub or shower regularly.  Attach bath mats securely with double-sided non-slip rug tape.  Do not have throw rugs and other things on the floor that can make you trip. What can I do in the bedroom?  Use night lights.  Make sure that you have a light by your bed that is easy to reach.  Do not use any sheets or blankets  that are too big for your bed. They should not hang down onto the floor.  Have a firm chair that has side arms. You can use this for support while you get dressed.  Do not have throw rugs and other things on the floor that can make you trip. What can I do in the kitchen?  Clean up any spills right away.  Avoid walking on wet floors.  Keep items that you use a lot in easy-to-reach places.  If you need to reach something above you, use a strong step stool that has a grab bar.  Keep electrical cords out of the way.  Do not use floor polish or wax that makes floors slippery. If you must use wax, use non-skid floor wax.  Do not have throw rugs and other things on the floor that can make you trip. What can I do with my stairs?  Do not leave any items on the stairs.  Make sure that there are handrails on both sides of the stairs and use them. Fix handrails  that are broken or loose. Make sure that handrails are as long as the stairways.  Check any carpeting to make sure that it is firmly attached to the stairs. Fix any carpet that is loose or worn.  Avoid having throw rugs at the top or bottom of the stairs. If you do have throw rugs, attach them to the floor with carpet tape.  Make sure that you have a light switch at the top of the stairs and the bottom of the stairs. If you do not have them, ask someone to add them for you. What else can I do to help prevent falls?  Wear shoes that:  Do not have high heels.  Have rubber bottoms.  Are comfortable and fit you well.  Are closed at the toe. Do not wear sandals.  If you use a stepladder:  Make sure that it is fully opened. Do not climb a closed stepladder.  Make sure that both sides of the stepladder are locked into place.  Ask someone to hold it for you, if possible.  Clearly mark and make sure that you can see:  Any grab bars or handrails.  First and last steps.  Where the edge of each step is.  Use tools that help you move around (mobility aids) if they are needed. These include:  Canes.  Walkers.  Scooters.  Crutches.  Turn on the lights when you go into a dark area. Replace any light bulbs as soon as they burn out.  Set up your furniture so you have a clear path. Avoid moving your furniture around.  If any of your floors are uneven, fix them.  If there are any pets around you, be aware of where they are.  Review your medicines with your doctor. Some medicines can make you feel dizzy. This can increase your chance of falling. Ask your doctor what other things that you can do to help prevent falls. This information is not intended to replace advice given to you by your health care provider. Make sure you discuss any questions you have with your health care provider. Document Released: 01/02/2009 Document Revised: 08/14/2015 Document Reviewed: 04/12/2014 Elsevier  Interactive Patient Education  2017 [employer].

## 2020-03-12 ENCOUNTER — Other Ambulatory Visit: Payer: Self-pay

## 2020-03-12 ENCOUNTER — Ambulatory Visit (INDEPENDENT_AMBULATORY_CARE_PROVIDER_SITE_OTHER): Payer: Medicare Other | Admitting: Family Medicine

## 2020-03-12 ENCOUNTER — Other Ambulatory Visit: Payer: Self-pay | Admitting: Family Medicine

## 2020-03-12 ENCOUNTER — Encounter: Payer: Self-pay | Admitting: Family Medicine

## 2020-03-12 DIAGNOSIS — Z6834 Body mass index (BMI) 34.0-34.9, adult: Secondary | ICD-10-CM

## 2020-03-12 DIAGNOSIS — K5900 Constipation, unspecified: Secondary | ICD-10-CM

## 2020-03-12 DIAGNOSIS — G44219 Episodic tension-type headache, not intractable: Secondary | ICD-10-CM

## 2020-03-12 DIAGNOSIS — I7300 Raynaud's syndrome without gangrene: Secondary | ICD-10-CM

## 2020-03-12 DIAGNOSIS — R7303 Prediabetes: Secondary | ICD-10-CM

## 2020-03-12 DIAGNOSIS — E6609 Other obesity due to excess calories: Secondary | ICD-10-CM | POA: Diagnosis not present

## 2020-03-12 DIAGNOSIS — Z1231 Encounter for screening mammogram for malignant neoplasm of breast: Secondary | ICD-10-CM

## 2020-03-12 DIAGNOSIS — E66811 Obesity, class 1: Secondary | ICD-10-CM

## 2020-03-12 NOTE — Assessment & Plan Note (Signed)
I encouraged healthy diet.  Discussed making sure she got adequate protein and vegetables.

## 2020-03-12 NOTE — Assessment & Plan Note (Addendum)
Refer to rheumatology as she has not been able to tolerate a calcium channel blocker in the past due to swelling.

## 2020-03-12 NOTE — Assessment & Plan Note (Signed)
I suggested adding a fiber supplement daily.  She can continue milk of magnesia on an as-needed basis.  If this is not improving with the fiber supplement she will let us know.

## 2020-03-12 NOTE — Patient Instructions (Signed)
Nice to see you. We will continue with nortriptyline.  If her constipation does not improve with a fiber supplement or if it worsens please let us know. Please make sure you get plenty of protein in your diet. I have referred her to rheumatology for the Raynaud's.

## 2020-03-12 NOTE — Progress Notes (Signed)
Tommi Rumps, MD Phone: [phone]  Alexandra Bishop is a 74 y.o. female who presents today for f/u.  Chronic headaches: These have been improving on nortriptyline 30 mg once daily.  Does note 2 episodes of constipation since going up on the dose of the nortriptyline.  Constipation: Patient notes 2 days this week she had issues with this.  She had hard balls of stool.  They were uncomfortable coming out.  No blood in her stool.  No abdominal pain.  She took milk of magnesia with good benefit.  Obesity: Patient notes some weight loss after having some dental work when she was placed on a liquid and then soft diet.  She still getting vegetables and fruits.  Does note the soft diet was difficult to get adequate protein though she notes she would have some yogurt or milk.  Raynaud's: Patient has a history of this in the past.  Notes since it has become colder she has had more issues with this in her feet and notes some slight discoloration to her toes with some occasional pain.  Social History   Tobacco Use  Smoking Status Never Smoker  Smokeless Tobacco Never Used  Tobacco Comment   tobacco use - no      ROS see history of present illness  Objective  Physical Exam Vitals:   03/12/20 1452  BP: 118/79  Pulse: 94  Temp: 98.8 F (37.1 C)  SpO2: 98%    BP Readings from Last 3 Encounters:  03/12/20 118/79  01/30/20 120/80  12/17/19 122/70   Wt Readings from Last 3 Encounters:  03/12/20 207 lb 9.6 oz (94.2 kg)  03/06/20 211 lb (95.7 kg)  01/30/20 211 lb 12.8 oz (96.1 kg)    Physical Exam Constitutional:      General: She is not in acute distress.    Appearance: She is not diaphoretic.  Cardiovascular:     Rate and Rhythm: Normal rate and regular rhythm.     Heart sounds: Normal heart sounds.  Pulmonary:     Effort: Pulmonary effort is normal.     Breath sounds: Normal breath sounds.  Abdominal:     General: Bowel sounds are normal. There is no distension.      Palpations: Abdomen is soft.     Tenderness: There is no abdominal tenderness. There is no guarding or rebound.  Musculoskeletal:        General: No edema.  Skin:    General: Skin is warm and dry.     Comments: Bilateral toes are slightly purpleish, toes are not cold currently, 2+ PT and DP pulses bilaterally  Neurological:     Mental Status: She is alert.      Assessment/Plan: Please see individual problem list.  Problem List Items Addressed This Visit    Constipation    I suggested adding a fiber supplement daily.  She can continue milk of magnesia on an as-needed basis.  If this is not improving with the fiber supplement she will let us know.      Episodic tension-type headache, not intractable    Improving progressively.  Given improvement will continue with nortriptyline at her current dose.  She will monitor her constipation and if it does not improve with adding a fiber supplement or if it worsens she will let us know and we can consider an alternative medication.      Relevant Medications   amLODipine (NORVASC) 10 MG tablet   Obesity    I encouraged healthy diet.  Discussed making sure she got adequate protein and vegetables.      Prediabetes    Encouraged diet and exercise.      Raynaud's phenomenon    Refer to rheumatology as she has not been able to tolerate a calcium channel blocker in the past due to swelling.       Relevant Orders   Ambulatory referral to Rheumatology      This visit occurred during the SARS-CoV-2 public health emergency.  Safety protocols were in place, including screening questions prior to the visit, additional usage of staff PPE, and extensive cleaning of exam room while observing appropriate contact time as indicated for disinfecting solutions.    [name], MD [facility]

## 2020-03-12 NOTE — Assessment & Plan Note (Signed)
Improving progressively.  Given improvement will continue with nortriptyline at her current dose.  She will monitor her constipation and if it does not improve with adding a fiber supplement or if it worsens she will let us know and we can consider an alternative medication.

## 2020-03-12 NOTE — Assessment & Plan Note (Signed)
Encouraged diet and exercise.  

## 2020-03-20 ENCOUNTER — Telehealth: Payer: Self-pay | Admitting: *Deleted

## 2020-03-20 ENCOUNTER — Ambulatory Visit (INDEPENDENT_AMBULATORY_CARE_PROVIDER_SITE_OTHER): Payer: Medicare Other

## 2020-03-20 DIAGNOSIS — M19171 Post-traumatic osteoarthritis, right ankle and foot: Secondary | ICD-10-CM

## 2020-03-20 NOTE — Telephone Encounter (Signed)
Angie can you see if there's any in [facility] that didn't cross over to Epic? I may have shown her the old x-rays. I also don't see any charges for an ankle x-ray, I would ask Chip Boer about that

## 2020-03-20 NOTE — Telephone Encounter (Signed)
I spoke with patient, she requested a print out of her right ankle xrays from 02/20/20 visit.  Xrays have been printed and patient will pick up at [facility] front desk

## 2020-03-20 NOTE — Telephone Encounter (Signed)
"  I had xrays done of my right ankle by Dr. Lilian Kapur.  I'd like to get a paper copy of the xray so I can take it to my appointment at [facility] on Tuesday."  I don't see any xrays of your right ankle.  "You should because I requested it be taken when I came in along with my left foot.  Dr. Lilian Kapur even showed the xray to me and said Arthritis had developed."  The only xray I see of your right ankle was in 2019.  "Well, I was charged for it.  If it's not there, then there's a problem.  The charges need to be taken out.  He showed the xrays to be."  I'll transfer you to the nurse and see if she can help you.  "Can you just give her the message and have her call me back?"  Yes, I'll give her the message.

## 2020-03-20 NOTE — Addendum Note (Signed)
Addended by: Geraldine Contras D on: 03/20/2020 01:34 PM   Modules accepted: Orders

## 2020-03-25 DIAGNOSIS — M19072 Primary osteoarthritis, left ankle and foot: Secondary | ICD-10-CM | POA: Diagnosis not present

## 2020-03-25 DIAGNOSIS — M12571 Traumatic arthropathy, right ankle and foot: Secondary | ICD-10-CM | POA: Diagnosis not present

## 2020-03-28 ENCOUNTER — Telehealth: Payer: Self-pay | Admitting: Podiatry

## 2020-03-28 NOTE — Telephone Encounter (Signed)
Pt called to see if orthotics were back. Per Rick's not they were to be sent back to [facility] because they were not full length.  I have not received them and told pt that [facility] was closed for at least a week for the holiday but if I get them I will send them to [place] and have that office call you to let you know they got them.

## 2020-04-08 ENCOUNTER — Other Ambulatory Visit: Payer: Self-pay | Admitting: Family Medicine

## 2020-04-09 ENCOUNTER — Telehealth: Payer: Self-pay | Admitting: Podiatry

## 2020-04-09 NOTE — Telephone Encounter (Signed)
Pt called and since her orthotics have taken so long she is now having a second thought about getting them. She stated she has 2 pr at home that sort of work for her. She got the bill and it was sent to collections. But she is not wanting to get the orthotics. She said she got them but they were the wrong ones. Please call pt to discuss. I told pt if they were in production that we are not able to return them as the company has already started making them.

## 2020-04-10 ENCOUNTER — Telehealth: Payer: Self-pay | Admitting: Podiatry

## 2020-04-10 NOTE — Telephone Encounter (Signed)
Pt left voicemail stating she wanted to have a chat about her orthotics. She is a [facility] pt.

## 2020-04-18 DIAGNOSIS — L409 Psoriasis, unspecified: Secondary | ICD-10-CM | POA: Diagnosis not present

## 2020-04-18 DIAGNOSIS — Z79899 Other long term (current) drug therapy: Secondary | ICD-10-CM | POA: Diagnosis not present

## 2020-04-18 DIAGNOSIS — L219 Seborrheic dermatitis, unspecified: Secondary | ICD-10-CM | POA: Diagnosis not present

## 2020-04-23 ENCOUNTER — Other Ambulatory Visit: Payer: Self-pay

## 2020-04-23 ENCOUNTER — Ambulatory Visit (INDEPENDENT_AMBULATORY_CARE_PROVIDER_SITE_OTHER): Payer: Medicare Other | Admitting: Orthotics

## 2020-04-23 DIAGNOSIS — S99922A Unspecified injury of left foot, initial encounter: Secondary | ICD-10-CM

## 2020-04-23 DIAGNOSIS — M19171 Post-traumatic osteoarthritis, right ankle and foot: Secondary | ICD-10-CM

## 2020-04-23 DIAGNOSIS — S92025A Nondisplaced fracture of anterior process of left calcaneus, initial encounter for closed fracture: Secondary | ICD-10-CM

## 2020-04-23 NOTE — Progress Notes (Signed)
Patient picked up f/o and was pleased with fit, comfort, and function.  Worked well with footwear.  Told of rbeak in period and how to report any issues.  

## 2020-04-25 DIAGNOSIS — L819 Disorder of pigmentation, unspecified: Secondary | ICD-10-CM | POA: Diagnosis not present

## 2020-04-25 DIAGNOSIS — L409 Psoriasis, unspecified: Secondary | ICD-10-CM | POA: Diagnosis not present

## 2020-04-25 DIAGNOSIS — I7300 Raynaud's syndrome without gangrene: Secondary | ICD-10-CM | POA: Diagnosis not present

## 2020-04-25 DIAGNOSIS — I10 Essential (primary) hypertension: Secondary | ICD-10-CM | POA: Diagnosis not present

## 2020-04-27 ENCOUNTER — Other Ambulatory Visit: Payer: Self-pay | Admitting: Family Medicine

## 2020-04-27 DIAGNOSIS — G44219 Episodic tension-type headache, not intractable: Secondary | ICD-10-CM

## 2020-04-28 ENCOUNTER — Other Ambulatory Visit (INDEPENDENT_AMBULATORY_CARE_PROVIDER_SITE_OTHER): Payer: Self-pay | Admitting: Rheumatology

## 2020-04-28 DIAGNOSIS — L819 Disorder of pigmentation, unspecified: Secondary | ICD-10-CM

## 2020-04-28 DIAGNOSIS — I7300 Raynaud's syndrome without gangrene: Secondary | ICD-10-CM

## 2020-04-29 DIAGNOSIS — Z79899 Other long term (current) drug therapy: Secondary | ICD-10-CM | POA: Diagnosis not present

## 2020-04-29 DIAGNOSIS — Z111 Encounter for screening for respiratory tuberculosis: Secondary | ICD-10-CM | POA: Diagnosis not present

## 2020-05-02 ENCOUNTER — Ambulatory Visit (INDEPENDENT_AMBULATORY_CARE_PROVIDER_SITE_OTHER): Payer: Medicare Other

## 2020-05-02 ENCOUNTER — Other Ambulatory Visit: Payer: Self-pay

## 2020-05-02 DIAGNOSIS — L819 Disorder of pigmentation, unspecified: Secondary | ICD-10-CM

## 2020-05-02 DIAGNOSIS — I7300 Raynaud's syndrome without gangrene: Secondary | ICD-10-CM | POA: Diagnosis not present

## 2020-05-07 ENCOUNTER — Ambulatory Visit: Payer: Medicare Other | Admitting: Orthotics

## 2020-05-07 ENCOUNTER — Other Ambulatory Visit: Payer: Self-pay

## 2020-05-07 DIAGNOSIS — S92025A Nondisplaced fracture of anterior process of left calcaneus, initial encounter for closed fracture: Secondary | ICD-10-CM

## 2020-05-07 DIAGNOSIS — M19171 Post-traumatic osteoarthritis, right ankle and foot: Secondary | ICD-10-CM

## 2020-05-07 DIAGNOSIS — Q666 Other congenital valgus deformities of feet: Secondary | ICD-10-CM

## 2020-05-07 DIAGNOSIS — M722 Plantar fascial fibromatosis: Secondary | ICD-10-CM

## 2020-05-07 DIAGNOSIS — S99922A Unspecified injury of left foot, initial encounter: Secondary | ICD-10-CM

## 2020-05-07 NOTE — Progress Notes (Signed)
Took material out of RIGHT foot orthotic arch to soften it up

## 2020-05-08 ENCOUNTER — Ambulatory Visit
Admission: RE | Admit: 2020-05-08 | Discharge: 2020-05-08 | Disposition: A | Discharge status: Home / Self Care | Payer: Medicare Other | Source: Ambulatory Visit | Attending: Family Medicine | Admitting: Family Medicine

## 2020-05-08 DIAGNOSIS — Z1231 Encounter for screening mammogram for malignant neoplasm of breast: Secondary | ICD-10-CM | POA: Insufficient documentation

## 2020-05-13 DIAGNOSIS — R519 Headache, unspecified: Secondary | ICD-10-CM | POA: Diagnosis not present

## 2020-05-13 DIAGNOSIS — G930 Cerebral cysts: Secondary | ICD-10-CM | POA: Diagnosis not present

## 2020-05-20 ENCOUNTER — Other Ambulatory Visit: Payer: Self-pay | Admitting: Family Medicine

## 2020-05-21 DIAGNOSIS — I10 Essential (primary) hypertension: Secondary | ICD-10-CM | POA: Diagnosis not present

## 2020-05-21 DIAGNOSIS — Z6833 Body mass index (BMI) 33.0-33.9, adult: Secondary | ICD-10-CM | POA: Diagnosis not present

## 2020-05-21 DIAGNOSIS — G930 Cerebral cysts: Secondary | ICD-10-CM | POA: Diagnosis not present

## 2020-05-27 ENCOUNTER — Other Ambulatory Visit: Payer: Self-pay | Admitting: Family Medicine

## 2020-05-27 DIAGNOSIS — M1990 Unspecified osteoarthritis, unspecified site: Secondary | ICD-10-CM

## 2020-05-28 NOTE — Telephone Encounter (Signed)
Pharmacy requesting Sig to read one capsule 100 mg four times daily ok to fill?

## 2020-05-30 ENCOUNTER — Other Ambulatory Visit: Payer: Self-pay | Admitting: Family Medicine

## 2020-05-30 DIAGNOSIS — M1990 Unspecified osteoarthritis, unspecified site: Secondary | ICD-10-CM

## 2020-05-30 NOTE — Telephone Encounter (Signed)
I went ahead and sent this in to be taken 4 times a day though it looks like they may be requesting a PA given the frequency of dosing.

## 2020-06-02 DIAGNOSIS — I7300 Raynaud's syndrome without gangrene: Secondary | ICD-10-CM | POA: Insufficient documentation

## 2020-06-02 DIAGNOSIS — L819 Disorder of pigmentation, unspecified: Secondary | ICD-10-CM | POA: Diagnosis not present

## 2020-06-02 DIAGNOSIS — L408 Other psoriasis: Secondary | ICD-10-CM | POA: Diagnosis not present

## 2020-06-03 ENCOUNTER — Other Ambulatory Visit: Payer: Self-pay | Admitting: Family Medicine

## 2020-06-11 ENCOUNTER — Ambulatory Visit (INDEPENDENT_AMBULATORY_CARE_PROVIDER_SITE_OTHER): Payer: Medicare Other | Admitting: Family Medicine

## 2020-06-11 ENCOUNTER — Other Ambulatory Visit: Payer: Self-pay

## 2020-06-11 ENCOUNTER — Encounter: Payer: Self-pay | Admitting: Family Medicine

## 2020-06-11 DIAGNOSIS — I10 Essential (primary) hypertension: Secondary | ICD-10-CM | POA: Diagnosis not present

## 2020-06-11 DIAGNOSIS — I7300 Raynaud's syndrome without gangrene: Secondary | ICD-10-CM

## 2020-06-11 DIAGNOSIS — K5900 Constipation, unspecified: Secondary | ICD-10-CM | POA: Diagnosis not present

## 2020-06-11 DIAGNOSIS — R7303 Prediabetes: Secondary | ICD-10-CM

## 2020-06-11 DIAGNOSIS — M1990 Unspecified osteoarthritis, unspecified site: Secondary | ICD-10-CM | POA: Diagnosis not present

## 2020-06-11 DIAGNOSIS — E039 Hypothyroidism, unspecified: Secondary | ICD-10-CM

## 2020-06-11 LAB — LIPID PANEL
Cholesterol: 119 mg/dL (ref 0–200)
HDL: 48.9 mg/dL (ref >39.00)
LDL Cholesterol: 40 mg/dL (ref 0–99)
NonHDL: 70.33
Total CHOL/HDL Ratio: 2
Triglycerides: 153 mg/dL — ABNORMAL HIGH (ref 0.0–149.0)
VLDL: 30.6 mg/dL (ref 0.0–40.0)

## 2020-06-11 LAB — COMPREHENSIVE METABOLIC PANEL
ALT: 26 U/L (ref 0–35)
AST: 27 U/L (ref 0–37)
Albumin: 4.3 g/dL (ref 3.5–5.2)
Alkaline Phosphatase: 96 U/L (ref 39–117)
BUN: 13 mg/dL (ref 6–23)
CO2: 28 mEq/L (ref 19–32)
Calcium: 9.1 mg/dL (ref 8.4–10.5)
Chloride: 105 mEq/L (ref 96–112)
Creatinine, Ser: 0.88 mg/dL (ref 0.40–1.20)
GFR: 63.65 mL/min (ref >60.00)
Glucose, Bld: 100 mg/dL — ABNORMAL HIGH (ref 70–99)
Potassium: 4.5 mEq/L (ref 3.5–5.1)
Sodium: 141 mEq/L (ref 135–145)
Total Bilirubin: 0.7 mg/dL (ref 0.2–1.2)
Total Protein: 6.2 g/dL (ref 6.0–8.3)

## 2020-06-11 LAB — TSH: TSH: 0.45 u[IU]/mL (ref 0.35–4.50)

## 2020-06-11 LAB — HEMOGLOBIN A1C: Hgb A1c MFr Bld: 5.8 % (ref 4.6–6.5)

## 2020-06-11 NOTE — Assessment & Plan Note (Signed)
Check TSH 

## 2020-06-11 NOTE — Assessment & Plan Note (Addendum)
She will continue the Nitropaste at least while it still cold outside.

## 2020-06-11 NOTE — Assessment & Plan Note (Signed)
Chronic intermittent issue.  She can continue her fiber supplement and occasional use of milk of magnesia.  If not improving she could try MiraLAX.

## 2020-06-11 NOTE — Patient Instructions (Signed)
Nice to see you. You can take Tylenol for your right ankle if needed. We will get labs today and contact you with the results. You can also try MiraLAX if the fiber and milk of magnesia does not help with your constipation.

## 2020-06-11 NOTE — Assessment & Plan Note (Signed)
I suspect her right ankle pain is related to arthritis.  This is not bothering her too much at this time and I discussed she could take Tylenol over-the-counter on an as-needed basis.  If it worsens she will let us know.

## 2020-06-11 NOTE — Progress Notes (Signed)
Alexandra Rumps, MD Phone: [phone]  Alexandra Bishop is a 75 y.o. female who presents today for f/u.  HYPERTENSION  Disease Monitoring  Home BP Monitoring not checking Chest pain- no    Dyspnea- no Medications  Compliance-  Taking losartan.   Edema- no  HYPERLIPIDEMIA Symptoms Chest pain on exertion:  no    Medications: Compliance- taking lipitor Right upper quadrant pain- no   Right ankle pain: This is a fairly new issue.  Has been going on intermittently.  Notes its not severe.  She is not been taking any medications for this.  She saw podiatry recently and notes they advised her she had arthritis.  Raynaud's syndrome: Patient has seen rheumatology.  She has been on nitroglycerin ointment with some benefit.  Her toes do turn blue.  Typically this only bothers her when it is cold outside.  Constipation: Intermittent issue.  She has been taking fiber.  Takes a small amount of milk of magnesia when she is very constipated and that is beneficial.  Stools are small hard balls when she is constipated though other times she has normal stools.  No blood in her stool.   Social History   Tobacco Use  Smoking Status Never Smoker  Smokeless Tobacco Never Used  Tobacco Comment   tobacco use - no     Current Outpatient Medications on File Prior to Visit  Medication Sig Dispense Refill  . aspirin EC 81 MG tablet Take 81 mg by mouth every other day.    Marland Kitchen atorvastatin (LIPITOR) 20 MG tablet TAKE ONE TABLET BY MOUTH DAILY 90 tablet 3  . CALCIUM-MAGNESIUM-ZINC PO Take by mouth. Twice weekly    . chlorhexidine (PERIDEX) 0.12 % solution SMARTSIG:By Mouth    . clobetasol cream (TEMOVATE) 0.05 % Apply topically.    . clotrimazole (LOTRIMIN) 1 % cream Apply topically.    . cyanocobalamin 500 MCG tablet Take 500 mcg by mouth once a week.    . EUTHYROX 100 MCG tablet Take 1 tablet by mouth once daily 90 tablet 0  . fluocinonide (LIDEX) 0.05 % external solution Apply topically.    . gabapentin  (NEURONTIN) 100 MG capsule TAKE 1 CAPSULE BY MOUTH FOUR TIMES A DAY 120 capsule 0  . ketoconazole (NIZORAL) 2 % shampoo     . losartan (COZAAR) 50 MG tablet Take 1 tablet (50 mg total) by mouth daily. 90 tablet 3  . Multiple Vitamin (MULTIVITAMIN) tablet Take 1 tablet by mouth every other day.    . nitroGLYCERIN (NITROGLYN) 2 % ointment Place onto the skin.    Marland Kitchen nortriptyline (PAMELOR) 10 MG capsule TAKE 3 CAPSULES (30 MG TOTAL) BY MOUTH AT BEDTIME. 270 capsule 2  . Potassium Gluconate 595 MG CAPS Take by mouth every other day.    Orson Ape PEN 150 MG/ML SOAJ      No current facility-administered medications on file prior to visit.     ROS see history of present illness  Objective  Physical Exam Vitals:   06/11/20 1322  BP: 115/80  Pulse: 80  Temp: (!) 97.5 F (36.4 C)  SpO2: 98%    BP Readings from Last 3 Encounters:  06/11/20 115/80  03/12/20 118/79  01/30/20 120/80   Wt Readings from Last 3 Encounters:  06/11/20 213 lb 9.6 oz (96.9 kg)  03/12/20 207 lb 9.6 oz (94.2 kg)  03/06/20 211 lb (95.7 kg)    Physical Exam Constitutional:      General: She is not in acute distress.  Appearance: She is not diaphoretic.  Cardiovascular:     Rate and Rhythm: Normal rate and regular rhythm.     Heart sounds: Normal heart sounds.  Pulmonary:     Effort: Pulmonary effort is normal.     Breath sounds: Normal breath sounds.  Abdominal:     General: Bowel sounds are normal. There is no distension.     Palpations: Abdomen is soft.     Tenderness: There is no abdominal tenderness.  Musculoskeletal:     Comments: Right ankle with no apparent swelling, there is slight tenderness in the soft tissues around her ankle  Skin:    General: Skin is warm and dry.  Neurological:     Mental Status: She is alert.      Assessment/Plan: Please see individual problem list.  Problem List Items Addressed This Visit    Arthritis    I suspect her right ankle pain is related to arthritis.   This is not bothering her too much at this time and I discussed she could take Tylenol over-the-counter on an as-needed basis.  If it worsens she will let us know.      Constipation    Chronic intermittent issue.  She can continue her fiber supplement and occasional use of milk of magnesia.  If not improving she could try MiraLAX.      Hypertension    Adequate control.  Continue losartan 50 mg once daily.  Due for lab work.      Relevant Medications   nitroGLYCERIN (NITROGLYN) 2 % ointment   Other Relevant Orders   Comp Met (CMET)   Lipid panel   Hypothyroidism    Check TSH.      Relevant Orders   TSH   Prediabetes    Check A1c.      Relevant Orders   HgB A1c   Raynaud's phenomenon    She will continue the Nitropaste at least while it still cold outside.         This visit occurred during the SARS-CoV-2 public health emergency.  Safety protocols were in place, including screening questions prior to the visit, additional usage of staff PPE, and extensive cleaning of exam room while observing appropriate contact time as indicated for disinfecting solutions.    Alexandra Rumps, MD [place]

## 2020-06-11 NOTE — Assessment & Plan Note (Signed)
Adequate control.  Continue losartan 50 mg once daily.  Due for lab work.

## 2020-06-11 NOTE — Assessment & Plan Note (Signed)
Check A1c. 

## 2020-06-12 ENCOUNTER — Telehealth: Payer: Self-pay | Admitting: Family Medicine

## 2020-06-12 NOTE — Telephone Encounter (Signed)
Patient was calling in for lab results

## 2020-06-12 NOTE — Telephone Encounter (Signed)
I called and informed the patient that her lab results have not been resulted yet but I will call her when they are.  She understood.  Nina,cma

## 2020-06-24 ENCOUNTER — Other Ambulatory Visit: Payer: Self-pay | Admitting: Family Medicine

## 2020-06-24 DIAGNOSIS — M1990 Unspecified osteoarthritis, unspecified site: Secondary | ICD-10-CM

## 2020-08-15 DIAGNOSIS — R0989 Other specified symptoms and signs involving the circulatory and respiratory systems: Secondary | ICD-10-CM | POA: Diagnosis not present

## 2020-08-15 DIAGNOSIS — R1012 Left upper quadrant pain: Secondary | ICD-10-CM | POA: Diagnosis not present

## 2020-08-15 DIAGNOSIS — Z03818 Encounter for observation for suspected exposure to other biological agents ruled out: Secondary | ICD-10-CM | POA: Diagnosis not present

## 2020-08-15 DIAGNOSIS — N390 Urinary tract infection, site not specified: Secondary | ICD-10-CM | POA: Diagnosis not present

## 2020-08-15 DIAGNOSIS — B9689 Other specified bacterial agents as the cause of diseases classified elsewhere: Secondary | ICD-10-CM | POA: Diagnosis not present

## 2020-08-15 DIAGNOSIS — A499 Bacterial infection, unspecified: Secondary | ICD-10-CM | POA: Diagnosis not present

## 2020-08-15 DIAGNOSIS — J40 Bronchitis, not specified as acute or chronic: Secondary | ICD-10-CM | POA: Diagnosis not present

## 2020-08-15 DIAGNOSIS — R059 Cough, unspecified: Secondary | ICD-10-CM | POA: Diagnosis not present

## 2020-08-15 DIAGNOSIS — R109 Unspecified abdominal pain: Secondary | ICD-10-CM | POA: Diagnosis not present

## 2020-08-15 DIAGNOSIS — J019 Acute sinusitis, unspecified: Secondary | ICD-10-CM | POA: Diagnosis not present

## 2020-08-19 ENCOUNTER — Other Ambulatory Visit: Payer: Self-pay | Admitting: Family Medicine

## 2020-08-26 ENCOUNTER — Other Ambulatory Visit: Payer: Self-pay | Admitting: Family Medicine

## 2020-08-26 DIAGNOSIS — M1990 Unspecified osteoarthritis, unspecified site: Secondary | ICD-10-CM

## 2020-08-29 ENCOUNTER — Telehealth: Payer: Self-pay | Admitting: Family Medicine

## 2020-08-29 ENCOUNTER — Other Ambulatory Visit: Payer: Self-pay

## 2020-08-29 DIAGNOSIS — G44219 Episodic tension-type headache, not intractable: Secondary | ICD-10-CM

## 2020-08-29 MED ORDER — NORTRIPTYLINE HCL 10 MG PO CAPS: 30.0000 mg | ORAL_CAPSULE | Freq: Every day | ORAL | 2 refills | Status: DC

## 2020-08-29 NOTE — Telephone Encounter (Signed)
Patient is requesting a refill on her nortriptyline (PAMELOR) 10 MG capsule   Her pharmacy says she can not get it refilled until 08/04/20.

## 2020-08-29 NOTE — Telephone Encounter (Signed)
Medication has been sent in. Pt is going to confirm with pharmacy that is able to pick up and let me know if problems

## 2020-08-29 NOTE — Telephone Encounter (Signed)
PT called into the office too state that she is still having issues. She states the Rx is telling her she can not get it till June 16.

## 2020-09-01 ENCOUNTER — Telehealth: Payer: Self-pay | Admitting: Family Medicine

## 2020-09-01 ENCOUNTER — Encounter: Payer: Self-pay | Admitting: Family Medicine

## 2020-09-01 ENCOUNTER — Other Ambulatory Visit: Payer: Self-pay

## 2020-09-01 ENCOUNTER — Ambulatory Visit (INDEPENDENT_AMBULATORY_CARE_PROVIDER_SITE_OTHER): Payer: Medicare Other | Admitting: Family Medicine

## 2020-09-01 DIAGNOSIS — J019 Acute sinusitis, unspecified: Secondary | ICD-10-CM

## 2020-09-01 DIAGNOSIS — I10 Essential (primary) hypertension: Secondary | ICD-10-CM | POA: Diagnosis not present

## 2020-09-01 DIAGNOSIS — G44219 Episodic tension-type headache, not intractable: Secondary | ICD-10-CM | POA: Diagnosis not present

## 2020-09-01 DIAGNOSIS — L84 Corns and callosities: Secondary | ICD-10-CM | POA: Diagnosis not present

## 2020-09-01 DIAGNOSIS — R1012 Left upper quadrant pain: Secondary | ICD-10-CM

## 2020-09-01 DIAGNOSIS — J329 Chronic sinusitis, unspecified: Secondary | ICD-10-CM | POA: Insufficient documentation

## 2020-09-01 DIAGNOSIS — K5900 Constipation, unspecified: Secondary | ICD-10-CM | POA: Diagnosis not present

## 2020-09-01 MED ORDER — POLYETHYLENE GLYCOL 3350 17 GM/SCOOP PO POWD: 17.0000 g | Freq: Every day | ORAL | 0 refills | Status: DC | PRN

## 2020-09-01 NOTE — Patient Instructions (Signed)
Nice to see you. We will get a CT scan to evaluate your abdominal discomfort. We will contact you when I talk to the pharmacist about the nortriptyline. Please stop the milk of magnesia.  You can take the MiraLAX daily to help with constipation.

## 2020-09-01 NOTE — Telephone Encounter (Signed)
Please let the patient know I heard back from the clinical pharmacist.  The patient can remain off of the nortriptyline though if she has recurrence of headache she should let us know.  Also if she develops any withdrawal symptoms such as restlessness, anxiety, chills, muscle pain, dizziness, nausea, or vomiting she should let us know.

## 2020-09-01 NOTE — Assessment & Plan Note (Signed)
Symptoms have resolved with antibiotic treatment.  She will monitor for recurrence.

## 2020-09-01 NOTE — Assessment & Plan Note (Signed)
Chronic issue.  We will have her stop milk of magnesia.  We will have her start on MiraLAX daily.  She can continue her fiber supplement.

## 2020-09-01 NOTE — Assessment & Plan Note (Signed)
Encouraged adequate padding for the area.  Discussed monitoring for pain or changes.  Encouraged to follow-up with podiatry if this area is worsening.

## 2020-09-01 NOTE — Telephone Encounter (Signed)
Lft vm for pt to call ofc to sch CT abdomen. thanks

## 2020-09-01 NOTE — Telephone Encounter (Signed)
Called [facility] to clarify. It is too early for her to refill. [facility] has 2 prescriptions. One for 2 capsules q day and then one for 3 capsules q day but per the pharmacist even if she increased dose in the middle of a prescription she still should not be out. Insurance will not cover until 6/16. Attempted to call patient to explain. LMTCB.

## 2020-09-01 NOTE — Assessment & Plan Note (Signed)
Adequate control.  She will continue losartan 50 mg once daily.  Recent metabolic panel completed at the walk-in clinic.

## 2020-09-01 NOTE — Telephone Encounter (Signed)
-----   Message from De Hollingshead, Kiel sent at 09/01/2020  9:48 AM EDT ----- Hmm. This is a tricky one. I wonder if this short course is really enough to tell if she's going to have a recurrence of headaches, more so than worrying about discontinuation symptoms.   I would say have her call us if she develops headaches or discontinuation symptoms and you can restart 10 mg for a month or so and try again.   Catie ----- Message ----- From: Leone Haven, MD Sent: 09/01/2020   9:38 AM EDT To: De Hollingshead, RPH-CPP  This patient has been on nortriptyline 30 mg for the last 6 or so months.  She tapered down to 20 mg 3 to 4 days ago and then took 10 mg for 2 to 3 days on her own to taper off of this.  Is there any reason to taper her further?  She seems to have done well with a short taper.  Thanks.  Randall Hiss

## 2020-09-01 NOTE — Progress Notes (Signed)
Tommi Rumps, MD Phone: [phone]  Alexandra Bishop is a 75 y.o. female who presents today for f/u.  HYPERTENSION Disease Monitoring Home BP Monitoring not checking Chest pain- no    Dyspnea- no Medications Compliance-  taking losartan 50 mg daily.  Edema- no  Abdominal pain: This has been going on for the last month and a half or so.  Has been occurring in the left upper quadrant.  She was evaluated at [facility] walk-in clinic and diagnosed with a UTI based on urinalysis.  She had a sinus infection as well as constipation.  Her urine culture was negative.  She notes no urine symptoms.  She continues to have intermittent left upper quadrant pain.  Her constipation seems to have improved as she is having soft bowel movements each day with the use of fiber and milk of magnesia.  She notes no blood in her stool.  No nausea or vomiting.  No vaginal discharge or vaginal bleeding.  She does still have her uterus and ovaries.  Sinus infection: She was also treated for a sinus infection at the walk-in clinic.  She had a negative COVID and flu test.  She notes her sinus symptoms have resolved.  Chronic headaches: Patient has tapered down over the last several days on her nortriptyline.  She is now out of this medication though she has not had any headaches.  She wonders if she needs to continue on this.  Toe callus: This is on her right middle toe.  It is at the tip of the toe.  There is no associated pain.  She does follow with podiatry.  Social History   Tobacco Use  Smoking Status Never  Smokeless Tobacco Never  Tobacco Comments   tobacco use - no     Current Outpatient Medications on File Prior to Visit  Medication Sig Dispense Refill   aspirin EC 81 MG tablet Take 81 mg by mouth every other day.     atorvastatin (LIPITOR) 20 MG tablet TAKE ONE TABLET BY MOUTH DAILY 90 tablet 3   CALCIUM-MAGNESIUM-ZINC PO Take by mouth. Twice weekly     cyanocobalamin 500 MCG tablet Take 500 mcg by  mouth once a week.     EUTHYROX 100 MCG tablet Take 1 tablet by mouth once daily 90 tablet 0   gabapentin (NEURONTIN) 100 MG capsule TAKE 1 CAPSULE BY MOUTH FOUR TIMES A DAY (INS ONLY COVERS 90 AT A TIME, MD AWARE) 90 capsule 1   ketoconazole (NIZORAL) 2 % shampoo      losartan (COZAAR) 50 MG tablet Take 1 tablet (50 mg total) by mouth daily. 90 tablet 3   Multiple Vitamin (MULTIVITAMIN) tablet Take 1 tablet by mouth every other day.     Potassium Gluconate 595 MG CAPS Take by mouth every other day.     SKYRIZI PEN 150 MG/ML SOAJ      chlorhexidine (PERIDEX) 0.12 % solution SMARTSIG:By Mouth (Patient not taking: Reported on 09/01/2020)     clobetasol cream (TEMOVATE) 0.05 % Apply topically. (Patient not taking: Reported on 09/01/2020)     clotrimazole (LOTRIMIN) 1 % cream Apply topically.     fluocinonide (LIDEX) 0.05 % external solution Apply topically.     nitroGLYCERIN (NITROGLYN) 2 % ointment Place onto the skin.     No current facility-administered medications on file prior to visit.     ROS see history of present illness  Objective  Physical Exam Vitals:   09/01/20 0859  BP: 118/80  Pulse: 75  Temp: (!) 97.5 F (36.4 C)  SpO2: 95%    BP Readings from Last 3 Encounters:  09/01/20 118/80  06/11/20 115/80  03/12/20 118/79   Wt Readings from Last 3 Encounters:  09/01/20 213 lb (96.6 kg)  06/11/20 213 lb 9.6 oz (96.9 kg)  03/12/20 207 lb 9.6 oz (94.2 kg)    Physical Exam Constitutional:      General: She is not in acute distress.    Appearance: She is not diaphoretic.  Cardiovascular:     Rate and Rhythm: Normal rate and regular rhythm.     Heart sounds: Normal heart sounds.  Pulmonary:     Effort: Pulmonary effort is normal.     Breath sounds: Normal breath sounds.  Abdominal:     General: Bowel sounds are normal. There is no distension.     Palpations: Abdomen is soft.     Tenderness: There is no abdominal tenderness. There is no guarding or rebound.  Skin:     General: Skin is warm and dry.     Comments: Small callus near the tip of her right middle toe, no surrounding erythema, no tenderness  Neurological:     Mental Status: She is alert.     Assessment/Plan: Please see individual problem list.  Problem List Items Addressed This Visit     Hypertension    Adequate control.  She will continue losartan 50 mg once daily.  Recent metabolic panel completed at the walk-in clinic.       Episodic tension-type headache, not intractable    Asymptomatic.  We will check with our clinical pharmacist to determine if we need to taper further.  We will contact patient with this information.  She will try stress reduction with exercise.  For now she will remain off of nortriptyline.       Constipation    Chronic issue.  We will have her stop milk of magnesia.  We will have her start on MiraLAX daily.  She can continue her fiber supplement.       Relevant Medications   polyethylene glycol powder (GLYCOLAX/MIRALAX) 17 GM/SCOOP powder   Foot callus    Encouraged adequate padding for the area.  Discussed monitoring for pain or changes.  Encouraged to follow-up with podiatry if this area is worsening.       Left upper quadrant pain    This has been a intermittent persistent issue over the last month and a half.  Has not seemed to improve significantly with improvement in her constipation.  She has a benign abdominal exam.  We will proceed with CT imaging without contrast given the national contrast shortage.  I feel this will give [place] adequate information at this time.       Relevant Orders   CT Abdomen Pelvis Wo Contrast   Sinus infection    Symptoms have resolved with antibiotic treatment.  She will monitor for recurrence.        Return in about 3 months (around 12/02/2020).  This visit occurred during the SARS-CoV-2 public health emergency.  Safety protocols were in place, including screening questions prior to the visit, additional usage of  staff PPE, and extensive cleaning of exam room while observing appropriate contact time as indicated for disinfecting solutions.    Tommi Rumps, MD [place]

## 2020-09-01 NOTE — Assessment & Plan Note (Addendum)
Asymptomatic.  We will check with our clinical pharmacist to determine if we need to taper further.  We will contact patient with this information.  She will try stress reduction with exercise.  For now she will remain off of nortriptyline.

## 2020-09-01 NOTE — Assessment & Plan Note (Signed)
This has been a intermittent persistent issue over the last month and a half.  Has not seemed to improve significantly with improvement in her constipation.  She has a benign abdominal exam.  We will proceed with CT imaging without contrast given the national contrast shortage.  I feel this will give [place] adequate information at this time.

## 2020-09-03 DIAGNOSIS — Z23 Encounter for immunization: Secondary | ICD-10-CM | POA: Diagnosis not present

## 2020-09-03 NOTE — Telephone Encounter (Signed)
I called the patient and informed her that the provider spoke with pharmacist and she can remain off the nortriptyline but is she has recurrence of headaches to let us know and if she has any withdrawal symptoms let us know and she understood.  Rabia Argote,cma

## 2020-09-05 DIAGNOSIS — L409 Psoriasis, unspecified: Secondary | ICD-10-CM | POA: Diagnosis not present

## 2020-09-05 DIAGNOSIS — L219 Seborrheic dermatitis, unspecified: Secondary | ICD-10-CM | POA: Diagnosis not present

## 2020-09-05 DIAGNOSIS — L304 Erythema intertrigo: Secondary | ICD-10-CM | POA: Diagnosis not present

## 2020-09-12 ENCOUNTER — Ambulatory Visit
Admission: RE | Admit: 2020-09-12 | Discharge: 2020-09-12 | Disposition: A | Discharge status: Home / Self Care | Payer: Medicare Other | Source: Ambulatory Visit | Attending: Family Medicine | Admitting: Family Medicine

## 2020-09-12 ENCOUNTER — Other Ambulatory Visit: Payer: Self-pay

## 2020-09-12 ENCOUNTER — Ambulatory Visit: Payer: Medicare Other | Admitting: Family Medicine

## 2020-09-12 DIAGNOSIS — R1012 Left upper quadrant pain: Secondary | ICD-10-CM | POA: Insufficient documentation

## 2020-09-12 DIAGNOSIS — K5900 Constipation, unspecified: Secondary | ICD-10-CM | POA: Diagnosis not present

## 2020-09-12 DIAGNOSIS — M4317 Spondylolisthesis, lumbosacral region: Secondary | ICD-10-CM | POA: Diagnosis not present

## 2020-09-12 DIAGNOSIS — K5750 Diverticulosis of both small and large intestine without perforation or abscess without bleeding: Secondary | ICD-10-CM | POA: Diagnosis not present

## 2020-09-12 DIAGNOSIS — K429 Umbilical hernia without obstruction or gangrene: Secondary | ICD-10-CM | POA: Diagnosis not present

## 2020-09-15 NOTE — Progress Notes (Signed)
Left message to return call 

## 2020-09-16 ENCOUNTER — Other Ambulatory Visit: Payer: Self-pay | Admitting: Family Medicine

## 2020-09-16 DIAGNOSIS — D3501 Benign neoplasm of right adrenal gland: Secondary | ICD-10-CM

## 2020-09-16 MED ORDER — DEXAMETHASONE 1 MG PO TABS: 1.0000 mg | ORAL_TABLET | Freq: Once | ORAL | 0 refills | Status: AC

## 2020-09-18 ENCOUNTER — Other Ambulatory Visit: Payer: Self-pay

## 2020-09-18 ENCOUNTER — Other Ambulatory Visit (INDEPENDENT_AMBULATORY_CARE_PROVIDER_SITE_OTHER): Payer: Medicare Other

## 2020-09-18 DIAGNOSIS — D3501 Benign neoplasm of right adrenal gland: Secondary | ICD-10-CM

## 2020-09-26 LAB — ALDOSTERONE + RENIN ACTIVITY W/ RATIO
ALDO / PRA Ratio: 3.2 Ratio (ref 0.9–28.9)
Aldosterone: 16 ng/dL
Renin Activity: 5.02 ng/mL/h (ref 0.25–5.82)

## 2020-09-26 LAB — DHEA-SULFATE: DHEA-SO4: 9 ug/dL (ref 4–157)

## 2020-09-26 LAB — CORTISOL-AM, BLOOD: Cortisol - AM: 1.7 ug/dL — ABNORMAL LOW

## 2020-09-30 ENCOUNTER — Encounter: Payer: Self-pay | Admitting: Family Medicine

## 2020-09-30 DIAGNOSIS — D3500 Benign neoplasm of unspecified adrenal gland: Secondary | ICD-10-CM | POA: Insufficient documentation

## 2020-10-20 ENCOUNTER — Telehealth: Payer: Self-pay | Admitting: Family Medicine

## 2020-10-20 NOTE — Telephone Encounter (Signed)
I called and spoke with the patient and she stated her symptoms are headache and fatigue only.  Her symptoms started on July 29 but patient stated her and spouse went to a family reunion out of [place] on [place] 23.  Patient is willing to do the virtual, when did you want her to have the visit?  Jaymz Traywick,cma

## 2020-10-20 NOTE — Telephone Encounter (Signed)
You can see if any other provider in the office has any openings for a virtual visit though if there are no other openings today or tomorrow then she can be added to my schedule at 1:00 PM on 10/21/2020.Marland Kitchen

## 2020-10-20 NOTE — Telephone Encounter (Signed)
Please contact her and get more details on what symptoms she is having.  When did the symptoms start?  Would she want to consider one of the COVID oral treatments?  If she does want to consider 1 of those we should get her set up for virtual visit.  Thanks.

## 2020-10-20 NOTE — Telephone Encounter (Signed)
Pt called to let Dr. Caryl Bis know that she tested positive for Covid over the weekend after traveling from [place]. Pt stated that she is doing fine just a little fatigued, she will call if symptoms change

## 2020-10-20 NOTE — Telephone Encounter (Signed)
[facility] is scheduled with the provider tomorrow @ 1 pm for a virtual visit and she was notified.  Shamel Germond,cma

## 2020-10-21 ENCOUNTER — Telehealth (INDEPENDENT_AMBULATORY_CARE_PROVIDER_SITE_OTHER): Payer: Medicare Other | Admitting: Family Medicine

## 2020-10-21 ENCOUNTER — Other Ambulatory Visit: Payer: Self-pay

## 2020-10-21 DIAGNOSIS — Z8616 Personal history of COVID-19: Secondary | ICD-10-CM | POA: Insufficient documentation

## 2020-10-21 DIAGNOSIS — U071 COVID-19: Secondary | ICD-10-CM | POA: Diagnosis not present

## 2020-10-21 NOTE — Progress Notes (Signed)
Virtual Visit via telephone Note  This visit type was conducted due to national recommendations for restrictions regarding the COVID-19 pandemic (e.g. social distancing).  This format is felt to be most appropriate for this patient at this time.  All issues noted in this document were discussed and addressed.  No physical exam was performed (except for noted visual exam findings with Video Visits).   I connected with Alexandra Bishop today at  1:00 PM EDT by telephone and verified that I am speaking with the correct person using two identifiers. Location patient: home Location provider: work Persons participating in the virtual visit: patient, provider  I discussed the limitations, risks, security and privacy concerns of performing an evaluation and management service by telephone and the availability of in person appointments. I also discussed with the patient that there may be a patient responsible charge related to this service. The patient expressed understanding and agreed to proceed.  Interactive audio and video telecommunications were attempted between this provider and patient, however failed, due to patient having technical difficulties OR patient did not have access to video capability.  We continued and completed visit with audio only.   Reason for visit: COVID  HPI: Patient notes onset of symptoms on 10/15/2020.  She has had cough with some headache and feels weary.  She has some postnasal drip.  No congestion, fevers, shortness of breath, or sore throat.  No taste or smell disturbances.  Her husband has COVID as well.  She was vaccinated and boosted x2.  She has not been taking any medication for this.  She feels as though she has progressively improving.   ROS: See pertinent positives and negatives per HPI.  Past Medical History:  Diagnosis Date   Chronic pain of right ankle 11/09/2017   Heel fracture 01/30/2020   Hypothyroidism     Past Surgical History:  Procedure  Laterality Date   BREAST BIOPSY Right    neg   broken ankle - RT     BUNIONECTOMY  2015    Family History  Problem Relation Age of Onset   Cancer Mother        sarcoma   Heart disease Brother    Cancer Maternal Grandmother        breast   Breast cancer Maternal Grandmother    Cancer Paternal Grandmother        breast   Cancer Other        family hx   Thyroid disease Other        family hx   Thyroid cancer Neg Hx     SOCIAL HX: nonsmoker   Current Outpatient Medications:    aspirin EC 81 MG tablet, Take 81 mg by mouth every other day., Disp: , Rfl:    atorvastatin (LIPITOR) 20 MG tablet, TAKE ONE TABLET BY MOUTH DAILY, Disp: 90 tablet, Rfl: 3   CALCIUM-MAGNESIUM-ZINC PO, Take by mouth. Twice weekly, Disp: , Rfl:    chlorhexidine (PERIDEX) 0.12 % solution, , Disp: , Rfl:    clobetasol cream (TEMOVATE) 0.05 %, Apply topically., Disp: , Rfl:    clotrimazole (LOTRIMIN) 1 % cream, Apply topically., Disp: , Rfl:    cyanocobalamin 500 MCG tablet, Take 500 mcg by mouth once a week., Disp: , Rfl:    EUTHYROX 100 MCG tablet, Take 1 tablet by mouth once daily, Disp: 90 tablet, Rfl: 0   fluocinonide (LIDEX) 0.05 % external solution, Apply topically., Disp: , Rfl:    gabapentin (NEURONTIN) 100 MG capsule,  TAKE 1 CAPSULE BY MOUTH FOUR TIMES A DAY (INS ONLY COVERS 90 AT A TIME, MD AWARE), Disp: 90 capsule, Rfl: 1   ketoconazole (NIZORAL) 2 % shampoo, , Disp: , Rfl:    losartan (COZAAR) 50 MG tablet, Take 1 tablet (50 mg total) by mouth daily., Disp: 90 tablet, Rfl: 3   Multiple Vitamin (MULTIVITAMIN) tablet, Take 1 tablet by mouth every other day., Disp: , Rfl:    nitroGLYCERIN (NITROGLYN) 2 % ointment, Place onto the skin., Disp: , Rfl:    polyethylene glycol powder (GLYCOLAX/MIRALAX) 17 GM/SCOOP powder, Take 17 g by mouth daily as needed for mild constipation., Disp: 500 g, Rfl: 0   Potassium Gluconate 595 MG CAPS, Take by mouth every other day., Disp: , Rfl:    SKYRIZI PEN 150 MG/ML  SOAJ, , Disp: , Rfl:   EXAM: This was a telephone visit and thus no exam was completed.  ASSESSMENT AND PLAN:  Discussed the following assessment and plan:  Problem List Items Addressed This Visit     COVID-19    Patient with COVID symptoms and COVID positive test.  She is progressively improving.  She is outside the 5-day window for oral COVID treatment.  She will continue with supportive care.  Discussed she could try Tylenol and/or ibuprofen over-the-counter for any headache or fever.  She will contact us if she worsens.  If she develops shortness of breath, cough productive of blood, or fevers above 103 F she will seek medical attention in person.  She will remain quarantined through 10/25/2020.  Discussed coming off of quarantine if she is feeling better and has not had any fevers in the prior 24 hours.        Return if symptoms worsen or fail to improve.   I discussed the assessment and treatment plan with the patient. The patient was provided an opportunity to ask questions and all were answered. The patient agreed with the plan and demonstrated an understanding of the instructions.   The patient was advised to call back or seek an in-person evaluation if the symptoms worsen or if the condition fails to improve as anticipated.  I provided 9 minutes of non-face-to-face time during this encounter.   Tommi Rumps, MD

## 2020-10-21 NOTE — Assessment & Plan Note (Addendum)
Patient with COVID symptoms and COVID positive test.  She is progressively improving.  She is outside the 5-day window for oral COVID treatment.  She will continue with supportive care.  Discussed she could try Tylenol and/or ibuprofen over-the-counter for any headache or fever.  She will contact us if she worsens.  If she develops shortness of breath, cough productive of blood, or fevers above 103 F she will seek medical attention in person.  She will remain quarantined through 10/25/2020.  Discussed coming off of quarantine if she is feeling better and has not had any fevers in the prior 24 hours.

## 2020-10-23 ENCOUNTER — Other Ambulatory Visit: Payer: Self-pay | Admitting: Family Medicine

## 2020-10-23 DIAGNOSIS — M1990 Unspecified osteoarthritis, unspecified site: Secondary | ICD-10-CM

## 2020-11-21 ENCOUNTER — Other Ambulatory Visit: Payer: Self-pay | Admitting: Internal Medicine

## 2020-12-02 ENCOUNTER — Ambulatory Visit (INDEPENDENT_AMBULATORY_CARE_PROVIDER_SITE_OTHER): Payer: Medicare Other | Admitting: Family Medicine

## 2020-12-02 ENCOUNTER — Encounter: Payer: Self-pay | Admitting: Family Medicine

## 2020-12-02 ENCOUNTER — Other Ambulatory Visit: Payer: Self-pay

## 2020-12-02 VITALS — BP 124/80 | HR 83 | Ht 65.0 in | Wt 207.8 lb

## 2020-12-02 DIAGNOSIS — Z23 Encounter for immunization: Secondary | ICD-10-CM

## 2020-12-02 DIAGNOSIS — L409 Psoriasis, unspecified: Secondary | ICD-10-CM

## 2020-12-02 DIAGNOSIS — I10 Essential (primary) hypertension: Secondary | ICD-10-CM

## 2020-12-02 DIAGNOSIS — E039 Hypothyroidism, unspecified: Secondary | ICD-10-CM | POA: Diagnosis not present

## 2020-12-02 DIAGNOSIS — Z6834 Body mass index (BMI) 34.0-34.9, adult: Secondary | ICD-10-CM | POA: Diagnosis not present

## 2020-12-02 DIAGNOSIS — E6609 Other obesity due to excess calories: Secondary | ICD-10-CM | POA: Diagnosis not present

## 2020-12-02 DIAGNOSIS — R1012 Left upper quadrant pain: Secondary | ICD-10-CM | POA: Diagnosis not present

## 2020-12-02 DIAGNOSIS — Z8616 Personal history of COVID-19: Secondary | ICD-10-CM | POA: Diagnosis not present

## 2020-12-02 LAB — BASIC METABOLIC PANEL
BUN: 17 mg/dL (ref 6–23)
CO2: 22 mEq/L (ref 19–32)
Calcium: 9.6 mg/dL (ref 8.4–10.5)
Chloride: 108 mEq/L (ref 96–112)
Creatinine, Ser: 0.79 mg/dL (ref 0.40–1.20)
GFR: 72.2 mL/min (ref >60.00)
Glucose, Bld: 104 mg/dL — ABNORMAL HIGH (ref 70–99)
Potassium: 4.3 mEq/L (ref 3.5–5.1)
Sodium: 139 mEq/L (ref 135–145)

## 2020-12-02 LAB — TSH: TSH: 0.29 u[IU]/mL — ABNORMAL LOW (ref 0.35–5.50)

## 2020-12-02 NOTE — Assessment & Plan Note (Signed)
I encouraged her to reschedule her appointment with dermatology.

## 2020-12-02 NOTE — Patient Instructions (Signed)
Nice to see you. You are seeing rheumatology for Raynaud's syndrome. Please try to add in walking for exercise 3 days a week. We will get lab work today and contact you with the results. Please contact dermatology to reschedule her appointment.

## 2020-12-02 NOTE — Progress Notes (Signed)
Tommi Rumps, MD Phone: [phone]  Alexandra Bishop is a 75 y.o. female who presents today for f/u.  HYPERTENSION Disease Monitoring Home BP Monitoring not checking Chest pain- no    Dyspnea- no Medications Compliance-  taking losartan.   Edema- no BMET    Component Value Date/Time   NA 141 06/11/2020 1342   K 4.5 06/11/2020 1342   CL 105 06/11/2020 1342   CO2 28 06/11/2020 1342   GLUCOSE 100 (H) 06/11/2020 1342   BUN 13 06/11/2020 1342   CREATININE 0.88 06/11/2020 1342   CALCIUM 9.1 06/11/2020 1342   HYPERLIPIDEMIA Symptoms Chest pain on exertion:  no   Medications: Compliance- taking lipitor Right upper quadrant pain- no  Muscle aches- no Lipid Panel     Component Value Date/Time   CHOL 119 06/11/2020 1342   TRIG 153.0 (H) 06/11/2020 1342   HDL 48.90 06/11/2020 1342   CHOLHDL 2 06/11/2020 1342   VLDL 30.6 06/11/2020 1342   LDLCALC 40 06/11/2020 1342   LDLDIRECT 51.0 11/01/2018 0843   HYPOTHYROIDISM Disease Monitoring Weight changes: no  Skin Changes: no Heat/Cold intolerance: chronic cold intolerance  Medication Monitoring Compliance:  taking Euthyrox Last TSH:   Lab Results  Component Value Date   TSH 0.45 06/11/2020   Obesity: Patient notes she does physical therapy exercises at home and some yard work though no other exercise.  She does eat lots of fruit.  Not as much vegetables.  Eats mostly fish though some chicken.  Limit sugar intake.  Limits fried food intake.  Eats 2.5 meals per day.  History of COVID-19: Patient notes she has recovered fairly well.  She does remain somewhat fatigued.  Left upper quadrant pain: This resolved.  CT imaging did not reveal a cause.  Psoriasis: She has been on [employer].  She feels as though its not beneficial.  She was due for follow-up with dermatology though she had to cancel her appointment for next Monday as there is a funeral to watch on TV.  Social History   Tobacco Use  Smoking Status Never  Smokeless  Tobacco Never  Tobacco Comments   tobacco use - no     Current Outpatient Medications on File Prior to Visit  Medication Sig Dispense Refill   aspirin EC 81 MG tablet Take 81 mg by mouth every other day.     atorvastatin (LIPITOR) 20 MG tablet TAKE ONE TABLET BY MOUTH DAILY 90 tablet 3   CALCIUM-MAGNESIUM-ZINC PO Take by mouth. Twice weekly     chlorhexidine (PERIDEX) 0.12 % solution      clobetasol cream (TEMOVATE) 0.05 % Apply topically.     clotrimazole (LOTRIMIN) 1 % cream Apply topically.     cyanocobalamin 500 MCG tablet Take 500 mcg by mouth once a week.     EUTHYROX 100 MCG tablet Take 1 tablet by mouth once daily 90 tablet 0   fluocinonide (LIDEX) 0.05 % external solution Apply topically.     gabapentin (NEURONTIN) 100 MG capsule TAKE 1 CAPSULE BY MOUTH FOUR TIMES A DAY (INS ONLY COVERS 90 AT A TIME, MD AWARE) 90 capsule 1   ketoconazole (NIZORAL) 2 % shampoo      losartan (COZAAR) 50 MG tablet Take 1 tablet (50 mg total) by mouth daily. 90 tablet 3   Multiple Vitamin (MULTIVITAMIN) tablet Take 1 tablet by mouth every other day.     nitroGLYCERIN (NITROGLYN) 2 % ointment Place onto the skin.     polyethylene glycol powder (GLYCOLAX/MIRALAX) 17 GM/SCOOP  powder Take 17 g by mouth daily as needed for mild constipation. 500 g 0   Potassium Gluconate 595 MG CAPS Take by mouth every other day.     SKYRIZI PEN 150 MG/ML SOAJ      No current facility-administered medications on file prior to visit.     ROS see history of present illness  Objective  Physical Exam Vitals:   12/02/20 0936  BP: 124/80  Pulse: 83  SpO2: 97%    BP Readings from Last 3 Encounters:  12/02/20 124/80  09/01/20 118/80  06/11/20 115/80   Wt Readings from Last 3 Encounters:  12/02/20 207 lb 12.8 oz (94.3 kg)  10/21/20 213 lb (96.6 kg)  09/01/20 213 lb (96.6 kg)    Physical Exam Constitutional:      General: She is not in acute distress.    Appearance: She is not diaphoretic.   Cardiovascular:     Rate and Rhythm: Normal rate and regular rhythm.     Heart sounds: Normal heart sounds.  Pulmonary:     Effort: Pulmonary effort is normal.     Breath sounds: Normal breath sounds.  Musculoskeletal:     Right lower leg: No edema.     Left lower leg: No edema.  Skin:    General: Skin is warm and dry.  Neurological:     Mental Status: She is alert.     Assessment/Plan: Please see individual problem list.  Problem List Items Addressed This Visit     Hypertension (Chronic)    Adequately controlled.  She will continue losartan 50 mg once daily.      Relevant Orders   Basic Metabolic Panel (BMET)   Hypothyroidism (Chronic)    Check TSH.  She will continue Euthyrox 100 mcg once daily.      Relevant Orders   TSH   Psoriasis (Chronic)    I encouraged her to reschedule her appointment with dermatology.      History of COVID-19    Much improved.  She will monitor the fatigue.      RESOLVED: Left upper quadrant pain    Resolved.  She will monitor for recurrence.      Obesity    Encouraged continued healthy diet.  Discussed adding in walking 3 days a week.      Other Visit Diagnoses     Need for influenza vaccination    -  Primary   Relevant Orders   Flu Vaccine QUAD High Dose(Fluad) (Completed)        Health Maintenance: flu vaccine given today.   Return in about 3 months (around 03/03/2021).  This visit occurred during the SARS-CoV-2 public health emergency.  Safety protocols were in place, including screening questions prior to the visit, additional usage of staff PPE, and extensive cleaning of exam room while observing appropriate contact time as indicated for disinfecting solutions.    Tommi Rumps, MD [facility]

## 2020-12-02 NOTE — Assessment & Plan Note (Signed)
Encouraged continued healthy diet.  Discussed adding in walking 3 days a week.

## 2020-12-02 NOTE — Assessment & Plan Note (Signed)
Adequately controlled.  She will continue losartan 50 mg once daily.

## 2020-12-02 NOTE — Assessment & Plan Note (Signed)
Much improved.  She will monitor the fatigue.

## 2020-12-02 NOTE — Assessment & Plan Note (Signed)
Resolved.  She will monitor for recurrence. 

## 2020-12-02 NOTE — Assessment & Plan Note (Signed)
Check TSH.  She will continue Euthyrox 100 mcg once daily.

## 2020-12-03 DIAGNOSIS — I7300 Raynaud's syndrome without gangrene: Secondary | ICD-10-CM | POA: Diagnosis not present

## 2020-12-03 DIAGNOSIS — L408 Other psoriasis: Secondary | ICD-10-CM | POA: Diagnosis not present

## 2020-12-03 DIAGNOSIS — I10 Essential (primary) hypertension: Secondary | ICD-10-CM | POA: Diagnosis not present

## 2020-12-09 ENCOUNTER — Other Ambulatory Visit: Payer: Self-pay | Admitting: Family Medicine

## 2020-12-09 DIAGNOSIS — E039 Hypothyroidism, unspecified: Secondary | ICD-10-CM

## 2020-12-09 MED ORDER — LEVOTHYROXINE SODIUM 88 MCG PO TABS: 88.0000 ug | ORAL_TABLET | Freq: Every day | ORAL | 0 refills | Status: DC

## 2020-12-16 DIAGNOSIS — Z79899 Other long term (current) drug therapy: Secondary | ICD-10-CM | POA: Diagnosis not present

## 2020-12-16 DIAGNOSIS — L219 Seborrheic dermatitis, unspecified: Secondary | ICD-10-CM | POA: Diagnosis not present

## 2020-12-16 DIAGNOSIS — L409 Psoriasis, unspecified: Secondary | ICD-10-CM | POA: Diagnosis not present

## 2020-12-16 DIAGNOSIS — L821 Other seborrheic keratosis: Secondary | ICD-10-CM | POA: Diagnosis not present

## 2020-12-21 ENCOUNTER — Other Ambulatory Visit: Payer: Self-pay | Admitting: Family Medicine

## 2020-12-21 DIAGNOSIS — I10 Essential (primary) hypertension: Secondary | ICD-10-CM

## 2020-12-22 ENCOUNTER — Other Ambulatory Visit: Payer: Self-pay | Admitting: Family Medicine

## 2020-12-22 DIAGNOSIS — M1990 Unspecified osteoarthritis, unspecified site: Secondary | ICD-10-CM

## 2020-12-31 ENCOUNTER — Telehealth: Payer: Self-pay | Admitting: Family Medicine

## 2020-12-31 ENCOUNTER — Other Ambulatory Visit: Payer: Self-pay | Admitting: Family Medicine

## 2020-12-31 DIAGNOSIS — M1990 Unspecified osteoarthritis, unspecified site: Secondary | ICD-10-CM

## 2020-12-31 NOTE — Telephone Encounter (Signed)
Patient needs a refill on hegabapentin (NEURONTIN) 100 MG capsuler

## 2020-12-31 NOTE — Telephone Encounter (Signed)
Med refill has already been responded to.

## 2021-03-02 DIAGNOSIS — Z79899 Other long term (current) drug therapy: Secondary | ICD-10-CM | POA: Diagnosis not present

## 2021-03-02 DIAGNOSIS — Z1159 Encounter for screening for other viral diseases: Secondary | ICD-10-CM | POA: Diagnosis not present

## 2021-03-02 DIAGNOSIS — L409 Psoriasis, unspecified: Secondary | ICD-10-CM | POA: Diagnosis not present

## 2021-03-03 ENCOUNTER — Encounter: Payer: Self-pay | Admitting: Family Medicine

## 2021-03-03 ENCOUNTER — Other Ambulatory Visit: Payer: Self-pay

## 2021-03-03 ENCOUNTER — Ambulatory Visit (INDEPENDENT_AMBULATORY_CARE_PROVIDER_SITE_OTHER): Payer: Medicare Other | Admitting: Family Medicine

## 2021-03-03 DIAGNOSIS — I10 Essential (primary) hypertension: Secondary | ICD-10-CM | POA: Diagnosis not present

## 2021-03-03 DIAGNOSIS — E039 Hypothyroidism, unspecified: Secondary | ICD-10-CM | POA: Diagnosis not present

## 2021-03-03 DIAGNOSIS — M1990 Unspecified osteoarthritis, unspecified site: Secondary | ICD-10-CM | POA: Diagnosis not present

## 2021-03-03 DIAGNOSIS — M654 Radial styloid tenosynovitis [de Quervain]: Secondary | ICD-10-CM

## 2021-03-03 DIAGNOSIS — E785 Hyperlipidemia, unspecified: Secondary | ICD-10-CM

## 2021-03-03 DIAGNOSIS — L409 Psoriasis, unspecified: Secondary | ICD-10-CM | POA: Diagnosis not present

## 2021-03-03 MED ORDER — ATORVASTATIN CALCIUM 20 MG PO TABS: 20.0000 mg | ORAL_TABLET | Freq: Every day | ORAL | 3 refills | Status: DC

## 2021-03-03 MED ORDER — LEVOTHYROXINE SODIUM 88 MCG PO TABS: 88.0000 ug | ORAL_TABLET | Freq: Every day | ORAL | 3 refills | Status: DC

## 2021-03-03 MED ORDER — GABAPENTIN 100 MG PO CAPS: ORAL_CAPSULE | ORAL | 1 refills | Status: DC

## 2021-03-03 NOTE — Addendum Note (Signed)
Addended by: Caryl Bis, Joeseph Verville G on: 03/03/2021 11:09 AM   Modules accepted: Orders

## 2021-03-03 NOTE — Assessment & Plan Note (Signed)
Adequately controlled.  She will continue losartan 50 mg once daily.

## 2021-03-03 NOTE — Assessment & Plan Note (Addendum)
I encouraged the patient to contact dermatology regarding the switch of offices and regarding the fluocinolone not being covered.  Treatment determined by dermatology.  She will have monitoring of her liver enzymes periodically on the methotrexate.

## 2021-03-03 NOTE — Assessment & Plan Note (Signed)
Adequately controlled.  She will continue Lipitor 20 mg once daily.

## 2021-03-03 NOTE — Progress Notes (Addendum)
Tommi Rumps, MD Phone: [phone]  Octa Uplinger is a 75 y.o. female who presents today for f/u.  HYPERTENSION Disease Monitoring Home BP Monitoring not checking Chest pain- no    Dyspnea- no Medications Compliance-  taking losartan.   Edema- no BMET    Component Value Date/Time   NA 139 12/02/2020 1007   K 4.3 12/02/2020 1007   CL 108 12/02/2020 1007   CO2 22 12/02/2020 1007   GLUCOSE 104 (H) 12/02/2020 1007   BUN 17 12/02/2020 1007   CREATININE 0.79 12/02/2020 1007   CALCIUM 9.6 12/02/2020 1007   HYPERLIPIDEMIA Symptoms Chest pain on exertion:  no    Medications: Compliance- taking lipitor Right upper quadrant pain- no  Muscle aches- no Patient is considering intermittent fasting with no food after 5 pm. Lipid Panel     Component Value Date/Time   CHOL 119 06/11/2020 1342   TRIG 153.0 (H) 06/11/2020 1342   HDL 48.90 06/11/2020 1342   CHOLHDL 2 06/11/2020 1342   VLDL 30.6 06/11/2020 1342   LDLCALC 40 06/11/2020 1342   LDLDIRECT 51.0 11/01/2018 0843   Scalp psoriasis: She has seen dermatology.  They want her to start on fluocinolone which is evidently not covered by insurance and start on methotrexate.  She will also start on folic acid.  She wants to switch dermatology offices to the [facility] location.  Wrist pain: She has had chronic right wrist pain that occurs intermittently.  It is on the radial aspect.  Her x-ray in November 2021 revealed osteoarthritis of the first [facility] joint.  Social History   Tobacco Use  Smoking Status Never  Smokeless Tobacco Never  Tobacco Comments   tobacco use - no     Current Outpatient Medications on File Prior to Visit  Medication Sig Dispense Refill   aspirin EC 81 MG tablet Take 81 mg by mouth every other day.     CALCIUM-MAGNESIUM-ZINC PO Take by mouth. Twice weekly     chlorhexidine (PERIDEX) 0.12 % solution      clobetasol cream (TEMOVATE) 0.05 % Apply topically.     clotrimazole (LOTRIMIN) 1 % cream Apply  topically.     cyanocobalamin 500 MCG tablet Take 500 mcg by mouth once a week.     fluocinonide (LIDEX) 0.05 % external solution Apply topically.     ketoconazole (NIZORAL) 2 % shampoo      losartan (COZAAR) 50 MG tablet TAKE 1 TABLET BY MOUTH EVERY DAY 90 tablet 3   Multiple Vitamin (MULTIVITAMIN) tablet Take 1 tablet by mouth every other day.     nitroGLYCERIN (NITROGLYN) 2 % ointment Place onto the skin.     polyethylene glycol powder (GLYCOLAX/MIRALAX) 17 GM/SCOOP powder Take 17 g by mouth daily as needed for mild constipation. 500 g 0   Potassium Gluconate 595 MG CAPS Take by mouth every other day.     SKYRIZI PEN 150 MG/ML SOAJ      No current facility-administered medications on file prior to visit.     ROS see history of present illness  Objective  Physical Exam Vitals:   03/03/21 1031  BP: 118/78  Pulse: 79  Temp: 98.4 F (36.9 C)  SpO2: 97%    BP Readings from Last 3 Encounters:  03/03/21 118/78  12/02/20 124/80  09/01/20 118/80   Wt Readings from Last 3 Encounters:  03/03/21 222 lb 12.8 oz (101.1 kg)  12/02/20 207 lb 12.8 oz (94.3 kg)  10/21/20 213 lb (96.6 kg)    Physical  Exam Constitutional:      General: She is not in acute distress.    Appearance: She is not diaphoretic.  Cardiovascular:     Rate and Rhythm: Normal rate and regular rhythm.     Heart sounds: Normal heart sounds.  Pulmonary:     Effort: Pulmonary effort is normal.     Breath sounds: Normal breath sounds.  Musculoskeletal:     Comments: Right radial wrist with slight tenderness near the first [facility] joint, negative Finkelstein's right wrist  Skin:    General: Skin is warm and dry.  Neurological:     Mental Status: She is alert.     Assessment/Plan: Please see individual problem list.  Problem List Items Addressed This Visit     Hyperlipidemia (Chronic)    Adequately controlled.  She will continue Lipitor 20 mg once daily.      Relevant Medications   atorvastatin (LIPITOR)  20 MG tablet   Hypertension (Chronic)    Adequately controlled.  She will continue losartan 50 mg once daily.      Relevant Medications   atorvastatin (LIPITOR) 20 MG tablet   Hypothyroidism (Chronic)   Relevant Medications   levothyroxine (EUTHYROX) 88 MCG tablet   Psoriasis (Chronic)    I encouraged the patient to contact dermatology regarding the switch of offices and regarding the fluocinolone not being covered.  Treatment determined by dermatology.  She will have monitoring of her liver enzymes periodically on the methotrexate.      De Quervain's tenosynovitis, right    Patient with right wrist pain.  Certainly this could be related to de Quervain's tenosynovitis though could also be related to [facility] arthritis.  I discussed icing the area with an ice cube for 10 minutes twice daily for 5 to 7 days.  She will let us know if this is not helpful.      Other Visit Diagnoses     Osteoarthritis, unspecified osteoarthritis type, unspecified site       Relevant Medications   gabapentin (NEURONTIN) 100 MG capsule       Return in about 3 months (around 06/01/2021).  This visit occurred during the SARS-CoV-2 public health emergency.  Safety protocols were in place, including screening questions prior to the visit, additional usage of staff PPE, and extensive cleaning of exam room while observing appropriate contact time as indicated for disinfecting solutions.    Tommi Rumps, MD [place]

## 2021-03-03 NOTE — Assessment & Plan Note (Signed)
Patient with right wrist pain.  Certainly this could be related to de Quervain's tenosynovitis though could also be related to [facility] arthritis.  I discussed icing the area with an ice cube for 10 minutes twice daily for 5 to 7 days.  She will let us know if this is not helpful.

## 2021-03-03 NOTE — Patient Instructions (Signed)
Nice to see you. Please call dermatology as we discussed. Please try the ice cube to ice your right wrist.  If this is not beneficial please let me know.

## 2021-03-06 ENCOUNTER — Telehealth: Payer: Self-pay | Admitting: Family Medicine

## 2021-03-06 NOTE — Telephone Encounter (Signed)
Patient came in and said her pharmacy never received the gabapentin (NEURONTIN) 100 MG capsule prescription on 03/03/2021. Please resend, thank you.

## 2021-03-09 ENCOUNTER — Ambulatory Visit: Payer: Medicare Other

## 2021-03-09 ENCOUNTER — Other Ambulatory Visit: Payer: Self-pay

## 2021-03-09 DIAGNOSIS — M1990 Unspecified osteoarthritis, unspecified site: Secondary | ICD-10-CM

## 2021-03-09 MED ORDER — GABAPENTIN 100 MG PO CAPS: ORAL_CAPSULE | ORAL | 1 refills | Status: DC

## 2021-03-09 NOTE — Telephone Encounter (Signed)
The prescription the patient called about was not filled, the pharmacy sent it back stating they needed a DX code, I applied the DX code and sent in  a new Rx for it to be filled today.  Alexandra Bishop,cma

## 2021-03-25 ENCOUNTER — Other Ambulatory Visit: Payer: Self-pay | Admitting: Family Medicine

## 2021-03-25 DIAGNOSIS — Z1231 Encounter for screening mammogram for malignant neoplasm of breast: Secondary | ICD-10-CM

## 2021-03-27 DIAGNOSIS — G930 Cerebral cysts: Secondary | ICD-10-CM | POA: Insufficient documentation

## 2021-03-27 HISTORY — DX: Cerebral cysts: G93.0

## 2021-03-30 DIAGNOSIS — L408 Other psoriasis: Secondary | ICD-10-CM | POA: Diagnosis not present

## 2021-03-31 DIAGNOSIS — Z1331 Encounter for screening for depression: Secondary | ICD-10-CM | POA: Diagnosis not present

## 2021-03-31 DIAGNOSIS — Z01419 Encounter for gynecological examination (general) (routine) without abnormal findings: Secondary | ICD-10-CM | POA: Diagnosis not present

## 2021-03-31 DIAGNOSIS — Z7689 Persons encountering health services in other specified circumstances: Secondary | ICD-10-CM | POA: Diagnosis not present

## 2021-03-31 DIAGNOSIS — Z124 Encounter for screening for malignant neoplasm of cervix: Secondary | ICD-10-CM | POA: Diagnosis not present

## 2021-04-07 DIAGNOSIS — Z23 Encounter for immunization: Secondary | ICD-10-CM | POA: Diagnosis not present

## 2021-04-27 ENCOUNTER — Telehealth: Payer: Self-pay | Admitting: Family Medicine

## 2021-04-27 NOTE — Telephone Encounter (Signed)
Please contact the patient.  I got a fax from her insurance stating that she was placed on fluconazole.  There is an interaction with Lipitor and fluconazole.  Please find out how long she has been on the fluconazole.  If it is a 1 or 2 dose regimen then there is no concern on my part though if it is a more prolonged regimen we may need to consider changing medications.

## 2021-04-28 NOTE — Telephone Encounter (Signed)
I called and spoke with the patient and she stated she has taken a weeks worth of the fluconazole  and she has 1 more week left. Please advise.   Alexandra Bishop,cma

## 2021-04-29 ENCOUNTER — Other Ambulatory Visit: Payer: Self-pay

## 2021-04-29 NOTE — Telephone Encounter (Signed)
Noted. Did she say what this was for?

## 2021-04-29 NOTE — Telephone Encounter (Signed)
I spoke with patient and the fluconazole was prescribed by her dermatologist for sebopsoriasis of her skin.  Alexandra Bishop,cma

## 2021-04-30 NOTE — Telephone Encounter (Signed)
I called and spoke with the patient and informed her that if she has any aches and muscle pain to let us know and she understood.  Alexandra Bishop,cma

## 2021-04-30 NOTE — Telephone Encounter (Signed)
Noted. If she notices any increasing muscle aches or pains she should stop the fluconazole and lipitor and let us know right away.

## 2021-05-12 ENCOUNTER — Other Ambulatory Visit: Payer: Self-pay

## 2021-05-12 ENCOUNTER — Ambulatory Visit
Admission: RE | Admit: 2021-05-12 | Discharge: 2021-05-12 | Disposition: A | Discharge status: Home / Self Care | Payer: Medicare Other | Source: Ambulatory Visit | Attending: Family Medicine | Admitting: Family Medicine

## 2021-05-12 DIAGNOSIS — Z1231 Encounter for screening mammogram for malignant neoplasm of breast: Secondary | ICD-10-CM | POA: Diagnosis not present

## 2021-05-13 DIAGNOSIS — L280 Lichen simplex chronicus: Secondary | ICD-10-CM | POA: Diagnosis not present

## 2021-05-13 DIAGNOSIS — L409 Psoriasis, unspecified: Secondary | ICD-10-CM | POA: Diagnosis not present

## 2021-05-13 DIAGNOSIS — R21 Rash and other nonspecific skin eruption: Secondary | ICD-10-CM | POA: Diagnosis not present

## 2021-05-13 DIAGNOSIS — L858 Other specified epidermal thickening: Secondary | ICD-10-CM | POA: Diagnosis not present

## 2021-05-26 DIAGNOSIS — H353131 Nonexudative age-related macular degeneration, bilateral, early dry stage: Secondary | ICD-10-CM | POA: Diagnosis not present

## 2021-05-26 LAB — HM DIABETES EYE EXAM

## 2021-06-02 DIAGNOSIS — L408 Other psoriasis: Secondary | ICD-10-CM | POA: Diagnosis not present

## 2021-06-02 DIAGNOSIS — I7300 Raynaud's syndrome without gangrene: Secondary | ICD-10-CM | POA: Diagnosis not present

## 2021-06-03 ENCOUNTER — Other Ambulatory Visit: Payer: Self-pay

## 2021-06-03 ENCOUNTER — Ambulatory Visit (INDEPENDENT_AMBULATORY_CARE_PROVIDER_SITE_OTHER): Payer: Medicare Other | Admitting: Family Medicine

## 2021-06-03 ENCOUNTER — Encounter: Payer: Self-pay | Admitting: Family Medicine

## 2021-06-03 VITALS — BP 122/80 | HR 73 | Temp 98.5°F | Ht 65.0 in | Wt 224.0 lb

## 2021-06-03 DIAGNOSIS — L409 Psoriasis, unspecified: Secondary | ICD-10-CM

## 2021-06-03 DIAGNOSIS — I10 Essential (primary) hypertension: Secondary | ICD-10-CM | POA: Diagnosis not present

## 2021-06-03 DIAGNOSIS — E039 Hypothyroidism, unspecified: Secondary | ICD-10-CM

## 2021-06-03 DIAGNOSIS — R7303 Prediabetes: Secondary | ICD-10-CM | POA: Diagnosis not present

## 2021-06-03 DIAGNOSIS — L821 Other seborrheic keratosis: Secondary | ICD-10-CM | POA: Diagnosis not present

## 2021-06-03 DIAGNOSIS — M654 Radial styloid tenosynovitis [de Quervain]: Secondary | ICD-10-CM | POA: Diagnosis not present

## 2021-06-03 LAB — COMPREHENSIVE METABOLIC PANEL
ALT: 26 U/L (ref 0–35)
AST: 29 U/L (ref 0–37)
Albumin: 4.4 g/dL (ref 3.5–5.2)
Alkaline Phosphatase: 83 U/L (ref 39–117)
BUN: 16 mg/dL (ref 6–23)
CO2: 24 mEq/L (ref 19–32)
Calcium: 9.6 mg/dL (ref 8.4–10.5)
Chloride: 107 mEq/L (ref 96–112)
Creatinine, Ser: 0.9 mg/dL (ref 0.40–1.20)
GFR: 61.53 mL/min (ref >60.00)
Glucose, Bld: 98 mg/dL (ref 70–99)
Potassium: 4.2 mEq/L (ref 3.5–5.1)
Sodium: 139 mEq/L (ref 135–145)
Total Bilirubin: 1 mg/dL (ref 0.2–1.2)
Total Protein: 6.5 g/dL (ref 6.0–8.3)

## 2021-06-03 LAB — HEMOGLOBIN A1C: Hgb A1c MFr Bld: 6 % (ref 4.6–6.5)

## 2021-06-03 LAB — TSH: TSH: 2.96 u[IU]/mL (ref 0.35–5.50)

## 2021-06-03 NOTE — Progress Notes (Signed)
?Tommi Rumps, MD ?Phone: [phone] ? ?Alexandra Bishop is a 76 y.o. female who presents today for follow-up. ? ?HYPERTENSION ?Disease Monitoring ?Home BP Monitoring not checking Chest pain- no    Dyspnea- no ?Medications ?Compliance-  taking amlodipine, losartan.   Edema- no ?BMET ?   ?Component Value Date/Time  ? NA 139 12/02/2020 1007  ? K 4.3 12/02/2020 1007  ? CL 108 12/02/2020 1007  ? CO2 22 12/02/2020 1007  ? GLUCOSE 104 (H) 12/02/2020 1007  ? BUN 17 12/02/2020 1007  ? CREATININE 0.79 12/02/2020 1007  ? CALCIUM 9.6 12/02/2020 1007  ? ?De Quervain's tenosynovitis: Patient notes this improved with icing. ? ?Prediabetes: No she is following a diabetic diet given that her husband was recently diagnosed with diabetes.  She notes she is not doing quite as good a job is him with this.  She is walking for exercise. ? ?Psoriasis/lichen simplex chronicus: She is following with dermatology for this.  She saw rheumatology recently and they felt as though her arthritic issues were more likely osteoarthritis.  She notes the dermatologist may want to start her on Tidioute.  She tried hydrocortisone on her face where she had some rash and feels as though that brought out brown spots. ? ?Social History  ? ?Tobacco Use  ?Smoking Status Never  ?Smokeless Tobacco Never  ?Tobacco Comments  ? tobacco use - no   ? ? ?Current Outpatient Medications on File Prior to Visit  ?Medication Sig Dispense Refill  ? amLODipine (NORVASC) 10 MG tablet     ? aspirin EC 81 MG tablet Take 81 mg by mouth every other day.    ? atorvastatin (LIPITOR) 20 MG tablet Take 1 tablet (20 mg total) by mouth daily. 90 tablet 3  ? CALCIUM-MAGNESIUM-ZINC PO Take by mouth. Twice weekly    ? chlorhexidine (PERIDEX) 0.12 % solution     ? clobetasol cream (TEMOVATE) 0.05 % Apply topically.    ? cyanocobalamin 500 MCG tablet Take 500 mcg by mouth once a week.    ? ELIDEL 1 % cream     ? fluconazole (DIFLUCAN) 150 MG tablet Take 150 mg by mouth daily.    ?  fluocinonide (LIDEX) 0.05 % external solution Apply topically.    ? gabapentin (NEURONTIN) 100 MG capsule TAKE 1 CAPSULE BY MOUTH FOUR TIMES A DAY (INS ONLY COVERS 90 AT A TIME, MD AWARE) 90 capsule 1  ? hydrocortisone 2.5 % cream Apply 1 application topically 2 (two) times daily as needed.    ? Hydrocortisone Acetate 1 % CREA     ? ketoconazole (NIZORAL) 2 % cream Apply topically.    ? levothyroxine (EUTHYROX) 88 MCG tablet Take 1 tablet (88 mcg total) by mouth daily. 90 tablet 3  ? levothyroxine (SYNTHROID) 100 MCG tablet     ? losartan (COZAAR) 50 MG tablet TAKE 1 TABLET BY MOUTH EVERY DAY 90 tablet 3  ? methotrexate (RHEUMATREX) 2.5 MG tablet Take by mouth.    ? Multiple Vitamin (MULTIVITAMIN) tablet Take 1 tablet by mouth every other day.    ? nortriptyline (PAMELOR) 10 MG capsule Take by mouth.    ? polyethylene glycol powder (GLYCOLAX/MIRALAX) 17 GM/SCOOP powder Take 17 g by mouth daily as needed for mild constipation. 500 g 0  ? Potassium Gluconate 595 MG CAPS Take by mouth every other day.    ? SKYRIZI PEN 150 MG/ML SOAJ     ? nitroGLYCERIN (NITROGLYN) 2 % ointment Place onto the skin.    ? ?  No current facility-administered medications on file prior to visit.  ? ? ? ?ROS see history of present illness ? ?Objective ? ?Physical Exam ?Vitals:  ? 06/03/21 1044  ?BP: 122/80  ?Pulse: 73  ?Temp: 98.5 ?F (36.9 ?C)  ?SpO2: 97%  ? ? ?BP Readings from Last 3 Encounters:  ?06/03/21 122/80  ?03/03/21 118/78  ?12/02/20 124/80  ? ?Wt Readings from Last 3 Encounters:  ?06/03/21 224 lb (101.6 kg)  ?03/03/21 222 lb 12.8 oz (101.1 kg)  ?12/02/20 207 lb 12.8 oz (94.3 kg)  ? ? ?Physical Exam ?Constitutional:   ?   General: She is not in acute distress. ?   Appearance: She is not diaphoretic.  ?Cardiovascular:  ?   Rate and Rhythm: Normal rate and regular rhythm.  ?   Heart sounds: Normal heart sounds.  ?Pulmonary:  ?   Effort: Pulmonary effort is normal.  ?   Breath sounds: Normal breath sounds.  ?Skin: ?   General: Skin is  warm and dry.  ?   Comments: Scattered seborrheic keratoses around her temples and forehead  ?Neurological:  ?   Mental Status: She is alert.  ? ? ? ?Assessment/Plan: Please see individual problem list. ? ?Problem List Items Addressed This Visit   ? ? Hypertension - Primary (Chronic)  ?  Well-controlled.  She will continue losartan 50 mg once daily and amlodipine 10 mg once daily.  Lab work today. ?  ?  ? Relevant Orders  ? Comp Met (CMET)  ? Hypothyroidism (Chronic)  ?  Check TSH. ?  ?  ? Relevant Orders  ? TSH  ? Psoriasis (Chronic)  ?  She will continue to follow with dermatology. ?  ?  ? De Quervain's tenosynovitis, right  ?  Resolved.  She will monitor for recurrence. ?  ?  ? Prediabetes  ?  Encouraged healthy diet and exercise.  We will check an A1c. ?  ?  ? Relevant Orders  ? HgB A1c  ? Seborrheic keratoses  ?  Discussed that these are benign appearing lesions on her face.  Advised there is not really any great treatment to resolve these.  Discussed sometimes they can use cryotherapy or remove them though oftentimes they will grow back.  She will see dermatology. ?  ?  ? ? ? ?Return in about 3 months (around 09/03/2021) for Prediabetes. ? ?This visit occurred during the SARS-CoV-2 public health emergency.  Safety protocols were in place, including screening questions prior to the visit, additional usage of staff PPE, and extensive cleaning of exam room while observing appropriate contact time as indicated for disinfecting solutions.  ? ? ?Tommi Rumps, MD ?[place] ? ?

## 2021-06-03 NOTE — Assessment & Plan Note (Signed)
Encouraged healthy diet and exercise.  We will check an A1c. ?

## 2021-06-03 NOTE — Assessment & Plan Note (Signed)
She will continue to follow with dermatology. 

## 2021-06-03 NOTE — Assessment & Plan Note (Signed)
Resolved.  She will monitor for recurrence. 

## 2021-06-03 NOTE — Assessment & Plan Note (Signed)
Well-controlled.  She will continue losartan 50 mg once daily and amlodipine 10 mg once daily.  Lab work today. ?

## 2021-06-03 NOTE — Assessment & Plan Note (Signed)
Discussed that these are benign appearing lesions on her face.  Advised there is not really any great treatment to resolve these.  Discussed sometimes they can use cryotherapy or remove them though oftentimes they will grow back.  She will see dermatology. ?

## 2021-06-03 NOTE — Assessment & Plan Note (Signed)
Check TSH 

## 2021-06-03 NOTE — Patient Instructions (Signed)
Nice to see. ?Please continue with healthy diet and exercise. ?We will get lab work today and contact you with the results. ?

## 2021-06-16 DIAGNOSIS — L814 Other melanin hyperpigmentation: Secondary | ICD-10-CM | POA: Diagnosis not present

## 2021-06-16 DIAGNOSIS — D229 Melanocytic nevi, unspecified: Secondary | ICD-10-CM | POA: Diagnosis not present

## 2021-06-16 DIAGNOSIS — L820 Inflamed seborrheic keratosis: Secondary | ICD-10-CM | POA: Diagnosis not present

## 2021-06-16 DIAGNOSIS — Z7952 Long term (current) use of systemic steroids: Secondary | ICD-10-CM | POA: Diagnosis not present

## 2021-06-16 DIAGNOSIS — L409 Psoriasis, unspecified: Secondary | ICD-10-CM | POA: Diagnosis not present

## 2021-06-16 DIAGNOSIS — L219 Seborrheic dermatitis, unspecified: Secondary | ICD-10-CM | POA: Diagnosis not present

## 2021-06-22 ENCOUNTER — Telehealth: Payer: Self-pay | Admitting: Family Medicine

## 2021-06-22 NOTE — Telephone Encounter (Signed)
Copied from [place] [phone]. Topic: Medicare AWV ?>> Jun 22, 2021 10:29 AM Harris-Coley, Hannah Beat wrote: ?Reason for CRM: Left message for patient to schedule Annual Wellness Visit.  Please schedule with Nurse Health Advisor Denisa O'Brien-Blaney, LPN at [facility].  Please call [phone] ask for Juliann Pulse ?

## 2021-06-30 ENCOUNTER — Other Ambulatory Visit: Payer: Self-pay | Admitting: Family Medicine

## 2021-06-30 DIAGNOSIS — M1990 Unspecified osteoarthritis, unspecified site: Secondary | ICD-10-CM

## 2021-07-03 ENCOUNTER — Ambulatory Visit (INDEPENDENT_AMBULATORY_CARE_PROVIDER_SITE_OTHER): Payer: Medicare Other

## 2021-07-03 VITALS — Ht 65.0 in | Wt 224.0 lb

## 2021-07-03 DIAGNOSIS — Z0000 Encounter for general adult medical examination without abnormal findings: Secondary | ICD-10-CM

## 2021-07-03 NOTE — Patient Instructions (Addendum)
?  Ms. Anastas , ?Thank you for taking time to come for your Medicare Wellness Visit. I appreciate your ongoing commitment to your health goals. Please review the following plan we discussed and let me know if I can assist you in the future.  ? ?These are the goals we discussed: ? Goals   ? ?  Follow up with Primary Care Provider   ?  As needed. ?  ? ?  ?  ?This is a list of the screening recommended for you and due dates:  ?Health Maintenance  ?Topic Date Due  ? Zoster (Shingles) Vaccine (1 of 2) 10/02/2021*  ? Flu Shot  10/20/2021  ? Tetanus Vaccine  08/10/2025  ? Pneumonia Vaccine  Completed  ? DEXA scan (bone density measurement)  Completed  ? COVID-19 Vaccine  Completed  ? Hepatitis C Screening: USPSTF Recommendation to screen - Ages 32-79 yo.  Completed  ? HPV Vaccine  Aged Out  ? Colon Cancer Screening  Discontinued  ?*Topic was postponed. The date shown is not the original due date.  ?  ?

## 2021-07-03 NOTE — Progress Notes (Signed)
Subjective:   Samarah Bratten is a 76 y.o. female who presents for Medicare Annual (Subsequent) preventive examination.  Review of Systems    No ROS.  Medicare Wellness Virtual Visit.  Visual/audio telehealth visit, UTA vital signs.   See social history for additional risk factors.   Cardiac Risk Factors include: advanced age (>18men, >40 women)     Objective:    Today's Vitals   07/03/21 1124  Weight: 224 lb (101.6 kg)  Height: 5\' 5"  (1.651 m)   Body mass index is 37.28 kg/m.     07/03/2021   11:28 AM 03/06/2020   10:45 AM 03/06/2019   10:57 AM 01/11/2018    1:59 PM 01/10/2017    2:40 PM 09/13/2016    1:07 PM 01/08/2016    3:07 PM  Advanced Directives  Does Patient Have a Medical Advance Directive? No No Yes No No No No  Type of Surveyor, minerals;Living will      Does patient want to make changes to medical advance directive?   No - Patient declined Yes (MAU/Ambulatory/Procedural Areas - Information given) No - Patient declined    Copy of Healthcare Power of Attorney in Chart?   No - copy requested      Would patient like information on creating a medical advance directive? No - Patient declined No - Patient declined     Yes - Educational materials given    Current Medications (verified) Outpatient Encounter Medications as of 07/03/2021  Medication Sig   amLODipine (NORVASC) 10 MG tablet    aspirin EC 81 MG tablet Take 81 mg by mouth every other day.   atorvastatin (LIPITOR) 20 MG tablet Take 1 tablet (20 mg total) by mouth daily.   CALCIUM-MAGNESIUM-ZINC PO Take by mouth. Twice weekly   chlorhexidine (PERIDEX) 0.12 % solution    clobetasol cream (TEMOVATE) 0.05 % Apply topically.   cyanocobalamin 500 MCG tablet Take 500 mcg by mouth once a week.   ELIDEL 1 % cream    fluconazole (DIFLUCAN) 150 MG tablet Take 150 mg by mouth daily.   fluocinonide (LIDEX) 0.05 % external solution Apply topically.   gabapentin (NEURONTIN) 100 MG capsule  TAKE 1 CAPSULE BY MOUTH FOUR TIMES A DAY (INS ONLY COVERS 90 AT A TIME, MD AWARE)   hydrocortisone 2.5 % cream Apply 1 application topically 2 (two) times daily as needed.   Hydrocortisone Acetate 1 % CREA    ketoconazole (NIZORAL) 2 % cream Apply topically.   levothyroxine (EUTHYROX) 88 MCG tablet Take 1 tablet (88 mcg total) by mouth daily.   levothyroxine (SYNTHROID) 100 MCG tablet    losartan (COZAAR) 50 MG tablet TAKE 1 TABLET BY MOUTH EVERY DAY   methotrexate (RHEUMATREX) 2.5 MG tablet Take by mouth.   Multiple Vitamin (MULTIVITAMIN) tablet Take 1 tablet by mouth every other day.   nitroGLYCERIN (NITROGLYN) 2 % ointment Place onto the skin.   nortriptyline (PAMELOR) 10 MG capsule Take by mouth.   polyethylene glycol powder (GLYCOLAX/MIRALAX) 17 GM/SCOOP powder Take 17 g by mouth daily as needed for mild constipation.   Potassium Gluconate 595 MG CAPS Take by mouth every other day.   SKYRIZI PEN 150 MG/ML SOAJ    No facility-administered encounter medications on file as of 07/03/2021.    Allergies (verified) Cymbalta [duloxetine hcl], Duloxetine, Hydrocodone, Shellfish allergy, and Tramadol   History: Past Medical History:  Diagnosis Date   Chronic pain of right ankle 11/09/2017   Heel fracture  01/30/2020   Hypothyroidism    Past Surgical History:  Procedure Laterality Date   BREAST BIOPSY Right    neg   broken ankle - RT     BUNIONECTOMY  2015   Family History  Problem Relation Age of Onset   Cancer Mother        sarcoma   Heart disease Brother    Cancer Maternal Grandmother        breast   Breast cancer Maternal Grandmother    Diabetes Maternal Grandfather    Cancer Paternal Grandmother        breast   Cancer Other        family hx   Thyroid disease Other        family hx   Thyroid cancer Neg Hx    Social History   Socioeconomic History   Marital status: Married    Spouse name: Not on file   Number of children: Not on file   Years of education: Not on  file   Highest education level: Not on file  Occupational History   Not on file  Tobacco Use   Smoking status: Never   Smokeless tobacco: Never   Tobacco comments:    tobacco use - no   Vaping Use   Vaping Use: Never used  Substance and Sexual Activity   Alcohol use: No   Drug use: No   Sexual activity: Never  Other Topics Concern   Not on file  Social History Narrative   From [place] originally. Followed daughter to [place] and son in [place].    Married; retired - office work; gets regular exercise - walks a mile most days. Tai Dorena Dew. Husband and her are following Paleo diet.    Social Determinants of Health   Financial Resource Strain: Low Risk    Difficulty of Paying Living Expenses: Not hard at all  Food Insecurity: No Food Insecurity   Worried About Programme researcher, broadcasting/film/video in the Last Year: Never true   Ran Out of Food in the Last Year: Never true  Transportation Needs: No Transportation Needs   Lack of Transportation (Medical): No   Lack of Transportation (Non-Medical): No  Physical Activity: Insufficiently Active   Days of Exercise per Week: 5 days   Minutes of Exercise per Session: 20 min  Stress: No Stress Concern Present   Feeling of Stress : Not at all  Social Connections: Unknown   Frequency of Communication with Friends and Family: Not on file   Frequency of Social Gatherings with Friends and Family: Not on file   Attends Religious Services: Not on Scientist, clinical (histocompatibility and immunogenetics) or Organizations: Not on file   Attends Banker Meetings: Not on file   Marital Status: Married    Tobacco Counseling Counseling given: Not Answered Tobacco comments: tobacco use - no    Clinical Intake:  Pre-visit preparation completed: Yes        Diabetes: No  How often do you need to have someone help you when you read instructions, pamphlets, or other written materials from your doctor or pharmacy?: 1 - Never   Interpreter Needed?: No       Activities of Daily Living    07/03/2021   11:29 AM  In your present state of health, do you have any difficulty performing the following activities:  Hearing? 0  Vision? 0  Difficulty concentrating or making decisions? 0  Walking or climbing stairs? 0  Dressing or  bathing? 0  Doing errands, shopping? 0  Preparing Food and eating ? N  Using the Toilet? N  In the past six months, have you accidently leaked urine? N  Do you have problems with loss of bowel control? N  Managing your Medications? N  Managing your Finances? Y  Comment Husband maintains  Housekeeping or managing your Housekeeping? N    Patient Care Team: Glori Luis, MD as PCP - General (Family Medicine)  Indicate any recent Medical Services you may have received from other than [facility] providers in the past year (date may be approximate).     Assessment:   This is a routine wellness examination for Blimi.  Hearing/Vision screen Hearing Screening - Comments:: Patient is able to hear conversational tones without difficulty. No issues reported. Vision Screening - Comments:: Followed by [facility] Wears corrective lenses  Visits every 6 months   Dietary issues and exercise activities discussed: Current Exercise Habits: Home exercise routine, Type of exercise: walking, Intensity: Mild   Goals Addressed             This Visit's Progress    Follow up with Primary Care Provider       As needed.       Depression Screen    07/03/2021   11:27 AM 06/03/2021   10:46 AM 03/03/2021   10:33 AM 12/02/2020    9:37 AM 06/11/2020    1:32 PM 03/06/2020   10:41 AM 12/17/2019   10:35 AM  PHQ 2/9 Scores  PHQ - 2 Score 0 0 0 0 0 0 0    Fall Risk    07/03/2021   11:29 AM 06/03/2021   10:46 AM 03/03/2021   10:33 AM 12/02/2020    9:37 AM 09/01/2020    9:14 AM  Fall Risk   Falls in the past year? 0 0 0 1 0  Number falls in past yr:  0 0 0 0  Injury with Fall?  0 1 0 0  Risk for fall due to :  No  Fall Risks No Fall Risks    Follow up Falls evaluation completed Falls evaluation completed Falls evaluation completed Falls evaluation completed Falls evaluation completed    FALL RISK PREVENTION PERTAINING TO THE HOME: Home free of loose throw rugs in walkways, pet beds, electrical cords, etc? Yes  Adequate lighting in your home to reduce risk of falls? Yes   ASSISTIVE DEVICES UTILIZED TO PREVENT FALLS: Life alert? No  Use of a cane, walker or w/c? No   TIMED UP AND GO: Was the test performed? No .   Cognitive Function: Patient is alert and oriented x3.     01/10/2017    3:03 PM 01/08/2016    3:40 PM 01/07/2015    3:33 PM  MMSE - Mini Mental State Exam  Orientation to time 5 5 5   Orientation to Place 5 5 5   Registration 3 3 3   Attention/ Calculation 5 5 5   Recall 3 3 3   Language- name 2 objects 2 2 2   Language- repeat 1 1 1   Language- follow 3 step command 3 3 3   Language- read & follow direction 1 1 1   Write a sentence 1 1 1   Copy design 1 1 1   Total score 30 30 30         03/06/2020   10:56 AM 03/06/2019   10:59 AM 01/11/2018    2:00 PM  6CIT Screen  What Year? 0 points 0 points 0  points  What month? 0 points 0 points 0 points  What time? 0 points 0 points 0 points  Count back from 20 0 points 0 points 0 points  Months in reverse 0 points 0 points 0 points  Repeat phrase 4 points 0 points 0 points  Total Score 4 points 0 points 0 points    Immunizations Immunization History  Administered Date(s) Administered   Fluad Quad(high Dose 65+) 12/01/2018, 12/17/2019, 12/02/2020   Influenza Split 01/04/2011, 11/24/2011, 12/13/2013   Influenza, High Dose Seasonal PF 12/24/2016   Influenza,inj,Quad PF,6+ Mos 11/12/2015   Influenza,inj,quad, With Preservative 11/12/2015   Influenza-Unspecified 01/03/2013, 12/23/2014, 12/24/2016, 11/08/2017, 12/12/2017   Moderna Sars-Covid-2 Vaccination 09/03/2020   PFIZER Comirnaty(Gray Top)Covid-19 Tri-Sucrose Vaccine 04/09/2019,  04/30/2019, 12/21/2019   PFIZER(Purple Top)SARS-COV-2 Vaccination 04/09/2019, 04/30/2019, 12/21/2019   Pneumococcal Conjugate-13 12/03/2013, 10/18/2016   Pneumococcal Polysaccharide-23 07/29/2009, 11/08/2017   Tdap 01/12/2008, 08/11/2015   Zoster, Live 11/24/2011    Shingrix Completed?: No.    Education has been provided regarding the importance of this vaccine. Patient has been advised to call insurance company to determine out of pocket expense if they have not yet received this vaccine. Advised may also receive vaccine at local pharmacy or Health Dept. Verbalized acceptance and understanding.  Screening Tests Health Maintenance  Topic Date Due   Zoster Vaccines- Shingrix (1 of 2) 10/02/2021 (Originally 08/10/1964)   INFLUENZA VACCINE  10/20/2021   TETANUS/TDAP  08/10/2025   Pneumonia Vaccine 59+ Years old  Completed   DEXA SCAN  Completed   COVID-19 Vaccine  Completed   Hepatitis C Screening  Completed   HPV VACCINES  Aged Out   COLONOSCOPY (Pts 45-90yrs Insurance coverage will need to be confirmed)  Discontinued    Health Maintenance  There are no preventive care reminders to display for this patient.  Mammogram- 05/12/21  Lung Cancer Screening: (Low Dose CT Chest recommended if Age 8-80 years, 30 pack-year currently smoking OR have quit w/in 15years.) does not qualify.   Vision Screening: Recommended annual ophthalmology exams for early detection of glaucoma and other disorders of the eye.  Dental Screening: Recommended annual dental exams for proper oral hygiene  Community Resource Referral / Chronic Care Management: CRR required this visit?  No   CCM required this visit?  No      Plan:     I have personally reviewed and noted the following in the patient's chart:   Medical and social history Use of alcohol, tobacco or illicit drugs  Current medications and supplements including opioid prescriptions.  Functional ability and status Nutritional status Physical  activity Advanced directives List of other physicians Hospitalizations, surgeries, and ER visits in previous 12 months Vitals Screenings to include cognitive, depression, and falls Referrals and appointments  In addition, I have reviewed and discussed with patient certain preventive protocols, quality metrics, and best practice recommendations. A written personalized care plan for preventive services as well as general preventive health recommendations were provided to patient.     Ashok Pall, LPN   9/60/4540

## 2021-08-18 ENCOUNTER — Other Ambulatory Visit: Payer: Self-pay | Admitting: Family Medicine

## 2021-08-18 DIAGNOSIS — M1990 Unspecified osteoarthritis, unspecified site: Secondary | ICD-10-CM

## 2021-08-19 ENCOUNTER — Ambulatory Visit (INDEPENDENT_AMBULATORY_CARE_PROVIDER_SITE_OTHER): Payer: Medicare Other | Admitting: Internal Medicine

## 2021-08-19 ENCOUNTER — Encounter: Payer: Self-pay | Admitting: Internal Medicine

## 2021-08-19 VITALS — BP 124/80 | HR 71 | Temp 97.9°F | Resp 14 | Ht 65.0 in | Wt 218.4 lb

## 2021-08-19 DIAGNOSIS — L237 Allergic contact dermatitis due to plants, except food: Secondary | ICD-10-CM

## 2021-08-19 MED ORDER — CLOBETASOL PROPIONATE 0.05 % EX CREA: TOPICAL_CREAM | Freq: Two times a day (BID) | CUTANEOUS | 2 refills | Status: DC

## 2021-08-19 MED ORDER — METHYLPREDNISOLONE ACETATE 40 MG/ML IJ SUSP
40.0000 mg | Freq: Once | INTRAMUSCULAR | Status: AC
  Administered 2021-08-19: 40 mg via INTRAMUSCULAR

## 2021-08-19 NOTE — Progress Notes (Signed)
Chief Complaint  Patient presents with   Poison Ivy    Onset x1 wk, was working around garden, started in arms and now is starting to spread and crease, some parts are stiff. Pt taking otc antihistamine every 7 hrs and calamine lotion w/minor relief.    F/u  Poison ivy x 1 week doing benadryl 25 q 7 hours to help with itching and rash to b/l arms and thighs she was working in the garden [employer]  Tried calamine lotion w/o relief   Review of Systems  Constitutional:  Negative for weight loss.  HENT:  Negative for hearing loss.   Eyes:  Negative for blurred vision.  Respiratory:  Negative for shortness of breath.   Cardiovascular:  Negative for chest pain.  Gastrointestinal:  Negative for abdominal pain and blood in stool.  Genitourinary:  Negative for dysuria.  Musculoskeletal:  Negative for falls and joint pain.  Skin:  Positive for itching and rash.  Neurological:  Negative for headaches.  Psychiatric/Behavioral:  Negative for depression.   Past Medical History:  Diagnosis Date   Chronic pain of right ankle 11/09/2017   Heel fracture 01/30/2020   Hypothyroidism    Past Surgical History:  Procedure Laterality Date   BREAST BIOPSY Right    neg   broken ankle - RT     BUNIONECTOMY  2015   Family History  Problem Relation Age of Onset   Cancer Mother        sarcoma   Heart disease Brother    Cancer Maternal Grandmother        breast   Breast cancer Maternal Grandmother    Diabetes Maternal Grandfather    Cancer Paternal Grandmother        breast   Cancer Other        family hx   Thyroid disease Other        family hx   Thyroid cancer Neg Hx    Social History   Socioeconomic History   Marital status: Married    Spouse name: Not on file   Number of children: Not on file   Years of education: Not on file   Highest education level: Not on file  Occupational History   Not on file  Tobacco Use   Smoking status: Never   Smokeless tobacco: Never   Tobacco  comments:    tobacco use - no   Vaping Use   Vaping Use: Never used  Substance and Sexual Activity   Alcohol use: No   Drug use: No   Sexual activity: Never  Other Topics Concern   Not on file  Social History Narrative   From [place] originally. Followed daughter to [place] and son in [place].    Married; retired - office work; gets regular exercise - walks a mile most days. [place]. Husband and her are following Paleo diet.    Social Determinants of Health   Financial Resource Strain: Low Risk    Difficulty of Paying Living Expenses: Not hard at all  Food Insecurity: No Food Insecurity   Worried About Charity fundraiser in the Last Year: Never true   Bucks in the Last Year: Never true  Transportation Needs: No Transportation Needs   Lack of Transportation (Medical): No   Lack of Transportation (Non-Medical): No  Physical Activity: Insufficiently Active   Days of Exercise per Week: 5 days   Minutes of Exercise per Session: 20 min  Stress: No Stress  Concern Present   Feeling of Stress : Not at all  Social Connections: Unknown   Frequency of Communication with Friends and Family: Not on file   Frequency of Social Gatherings with Friends and Family: Not on file   Attends Religious Services: Not on file   Active Member of Clubs or Organizations: Not on file   Attends Archivist Meetings: Not on file   Marital Status: Married  Human resources officer Violence: Not At Risk   Fear of Current or Ex-Partner: No   Emotionally Abused: No   Physically Abused: No   Sexually Abused: No   Current Meds  Medication Sig   amLODipine (NORVASC) 10 MG tablet    aspirin EC 81 MG tablet Take 81 mg by mouth every other day.   atorvastatin (LIPITOR) 20 MG tablet Take 1 tablet (20 mg total) by mouth daily.   CALCIUM-MAGNESIUM-ZINC PO Take by mouth. Twice weekly   chlorhexidine (PERIDEX) 0.12 % solution    cyanocobalamin 500 MCG tablet Take 500 mcg by mouth once a  week.   ELIDEL 1 % cream    fluconazole (DIFLUCAN) 150 MG tablet Take 150 mg by mouth daily.   fluocinonide (LIDEX) 0.05 % external solution Apply topically.   gabapentin (NEURONTIN) 100 MG capsule TAKE ONE CAPSULE BY MOUTH FOUR TIMES A DAY   hydrocortisone 2.5 % cream Apply 1 application topically 2 (two) times daily as needed.   Hydrocortisone Acetate 1 % CREA    ketoconazole (NIZORAL) 2 % cream Apply topically.   levothyroxine (EUTHYROX) 88 MCG tablet Take 1 tablet (88 mcg total) by mouth daily.   levothyroxine (SYNTHROID) 100 MCG tablet    losartan (COZAAR) 50 MG tablet TAKE 1 TABLET BY MOUTH EVERY DAY   Multiple Vitamin (MULTIVITAMIN) tablet Take 1 tablet by mouth every other day.   nitroGLYCERIN (NITROGLYN) 2 % ointment Place onto the skin.   nortriptyline (PAMELOR) 10 MG capsule Take by mouth.   polyethylene glycol powder (GLYCOLAX/MIRALAX) 17 GM/SCOOP powder Take 17 g by mouth daily as needed for mild constipation.   Potassium Gluconate 595 MG CAPS Take by mouth every other day.   [DISCONTINUED] clobetasol cream (TEMOVATE) 0.05 % Apply topically.   [DISCONTINUED] methotrexate (RHEUMATREX) 2.5 MG tablet Take by mouth.   Current Facility-Administered Medications for the 08/19/21 encounter (Office Visit) with McLean-Scocuzza, Nino Glow, MD  Medication   methylPREDNISolone acetate (DEPO-MEDROL) injection 40 mg   Allergies  Allergen Reactions   Cymbalta [Duloxetine Hcl]     Dizziness   Duloxetine     Other reaction(s): Unknown Dizziness   Hydrocodone     Feels Jittery   Shellfish Allergy     Other reaction(s): Vomiting   Tramadol     Feels jittery   Recent Results (from the past 2160 hour(s))  HM DIABETES EYE EXAM     Status: None   Collection Time: 05/26/21 12:00 AM  Result Value Ref Range   HM Diabetic Eye Exam No Retinopathy No Retinopathy  HgB A1c     Status: None   Collection Time: 06/03/21 10:59 AM  Result Value Ref Range   Hgb A1c MFr Bld 6.0 4.6 - 6.5 %     Comment: Glycemic Control Guidelines for People with Diabetes:Non Diabetic:  <6%Goal of Therapy: <7%Additional Action Suggested:  >8%   Comp Met (CMET)     Status: None   Collection Time: 06/03/21 10:59 AM  Result Value Ref Range   Sodium 139 135 - 145 mEq/L   Potassium  4.2 3.5 - 5.1 mEq/L   Chloride 107 96 - 112 mEq/L   CO2 24 19 - 32 mEq/L   Glucose, Bld 98 70 - 99 mg/dL   BUN 16 6 - 23 mg/dL   Creatinine, Ser 0.90 0.40 - 1.20 mg/dL   Total Bilirubin 1.0 0.2 - 1.2 mg/dL   Alkaline Phosphatase 83 39 - 117 U/L   AST 29 0 - 37 U/L   ALT 26 0 - 35 U/L   Total Protein 6.5 6.0 - 8.3 g/dL   Albumin 4.4 3.5 - 5.2 g/dL   GFR 61.53 >60.00 mL/min    Comment: Calculated using the CKD-EPI Creatinine Equation (2021)   Calcium 9.6 8.4 - 10.5 mg/dL  TSH     Status: None   Collection Time: 06/03/21 10:59 AM  Result Value Ref Range   TSH 2.96 0.35 - 5.50 uIU/mL   Objective  Body mass index is 36.34 kg/m. Wt Readings from Last 3 Encounters:  08/19/21 218 lb 6.4 oz (99.1 kg)  07/03/21 224 lb (101.6 kg)  06/03/21 224 lb (101.6 kg)   Temp Readings from Last 3 Encounters:  08/19/21 97.9 F (36.6 C) (Oral)  06/03/21 98.5 F (36.9 C) (Oral)  03/03/21 98.4 F (36.9 C) (Oral)   BP Readings from Last 3 Encounters:  08/19/21 124/80  06/03/21 122/80  03/03/21 118/78   Pulse Readings from Last 3 Encounters:  08/19/21 71  06/03/21 73  03/03/21 79    Physical Exam Vitals and nursing note reviewed.  Constitutional:      Appearance: Normal appearance. She is well-developed and well-groomed.  HENT:     Head: Normocephalic and atraumatic.  Eyes:     Conjunctiva/sclera: Conjunctivae normal.     Pupils: Pupils are equal, round, and reactive to light.  Cardiovascular:     Rate and Rhythm: Normal rate and regular rhythm.     Heart sounds: Normal heart sounds. No murmur heard. Pulmonary:     Effort: Pulmonary effort is normal.     Breath sounds: Normal breath sounds.  Abdominal:      General: Abdomen is flat. Bowel sounds are normal.     Tenderness: There is no abdominal tenderness.  Musculoskeletal:        General: No tenderness.  Skin:    General: Skin is warm and dry.     Comments: Poison ivy to hands b/l and legs b/l   Neurological:     General: No focal deficit present.     Mental Status: She is alert and oriented to person, place, and time. Mental status is at baseline.     Cranial Nerves: Cranial nerves 2-12 are intact.     Motor: Motor function is intact.     Coordination: Coordination is intact.     Gait: Gait is intact.  Psychiatric:        Attention and Perception: Attention and perception normal.        Mood and Affect: Mood and affect normal.        Speech: Speech normal.        Behavior: Behavior normal. Behavior is cooperative.        Thought Content: Thought content normal.        Cognition and Memory: Cognition and memory normal.        Judgment: Judgment normal.    Assessment  Plan  Poison ivy dermatitis - Plan: clobetasol cream (TEMOVATE) 0.05 %, methylPREDNISolone acetate (DEPO-MEDROL) injection 40 mg   F/u with PCP as  sch Provider: Dr. Olivia Mackie McLean-Scocuzza-[facility]

## 2021-08-19 NOTE — Patient Instructions (Addendum)
Ok to continue benadryl   Poison Ivy Dermatitis Poison ivy dermatitis is inflammation of the skin that is caused by chemicals in the leaves of the poison ivy plant. The skin reaction often involves redness, swelling, blisters, and extreme itching. What are the causes? This condition is caused by a chemical (urushiol) found in the sap of the poison ivy plant. This chemical is sticky and can be easily spread to people, animals, and objects. You can get poison ivy dermatitis by: Having direct contact with a poison ivy plant. Touching animals, other people, or objects that have come in contact with poison ivy and have the chemical on them. What increases the risk? This condition is more likely to develop in people who: Are outdoors often in wooded or [place] areas. Go outdoors without wearing protective clothing, such as closed shoes, long pants, and a long-sleeved shirt. What are the signs or symptoms? Symptoms of this condition include: Redness of the skin. Extreme itching. A rash that often includes bumps and blisters. The rash usually appears 48 hours after exposure, if you have been exposed before. If this is the first time you have been exposed, the rash may not appear until a week after exposure. Swelling. This may occur if the reaction is more severe. Symptoms usually last for 1-2 weeks. However, the first time you develop this condition, symptoms may last 3-4 weeks. How is this diagnosed? This condition may be diagnosed based on your symptoms and a physical exam. Your health care provider may also ask you about any recent outdoor activity. How is this treated? Treatment for this condition will vary depending on how severe it is. Treatment may include: Hydrocortisone cream or calamine lotion to relieve itching. Oatmeal baths to soothe the skin. Medicines, such as over-the-counter antihistamine tablets. Oral steroid medicine, for more severe reactions. Follow these instructions at  home: Medicines Take or apply over-the-counter and prescription medicines only as told by your health care provider. Use hydrocortisone cream or calamine lotion as needed to soothe the skin and relieve itching. General instructions Do not scratch or rub your skin. Apply a cold, wet cloth (cold compress) to the affected areas or take baths in cool water. This will help with itching. Avoid hot baths and showers. Take oatmeal baths as needed. Use colloidal oatmeal. You can get this at your local pharmacy or grocery store. Follow the instructions on the packaging. While you have the rash, wash clothes right after you wear them. Keep all follow-up visits as told by your health care provider. This is important. How is this prevented?  Learn to identify the poison ivy plant and avoid contact with the plant. This plant can be recognized by the number of leaves. Generally, poison ivy has three leaves with flowering branches on a single stem. The leaves are typically glossy, and they have jagged edges that come to a point at the front. If you have been exposed to poison ivy, thoroughly wash with soap and water right away. You have about 30 minutes to remove the plant resin before it will cause the rash. Be sure to wash under your fingernails, because any plant resin there will continue to spread the rash. When hiking or camping, wear clothes that will help you to avoid exposure on the skin. This includes long pants, a long-sleeved shirt, tall socks, and hiking boots. You can also apply preventive lotion to your skin to help limit exposure. If you suspect that your clothes or outdoor gear came in contact with  poison ivy, rinse them off outside with a garden hose before you bring them inside your house. When doing yard work or gardening, wear gloves, long sleeves, long pants, and boots. Wash your garden tools and gloves if they come in contact with poison ivy. If you suspect that your pet has come into contact  with poison ivy, wash him or her with pet shampoo and water. Make sure to wear gloves while washing your pet. Contact a health care provider if you have: Open sores in the rash area. More redness, swelling, or pain in the affected area. Redness that spreads beyond the rash area. Fluid, blood, or pus coming from the affected area. A fever. A rash over a large area of your body. A rash on your eyes, mouth, or genitals. A rash that does not improve after a few weeks. Get help right away if: Your face swells or your eyes swell shut. You have trouble breathing. You have trouble swallowing. These symptoms may represent a serious problem that is an emergency. Do not wait to see if the symptoms will go away. Get medical help right away. Call your local emergency services (911 in the [place] Do not drive yourself to the hospital. Summary Poison ivy dermatitis is inflammation of the skin that is caused by chemicals in the leaves of the poison ivy plant. Symptoms of this condition include redness, itching, a rash, and swelling. Do not scratch or rub your skin. Take or apply over-the-counter and prescription medicines only as told by your health care provider. This information is not intended to replace advice given to you by your health care provider. Make sure you discuss any questions you have with your health care provider. Document Revised: 12/22/2020 Document Reviewed: 12/22/2020 Elsevier Patient Education  [place].

## 2021-08-24 ENCOUNTER — Telehealth: Payer: Self-pay | Admitting: Family Medicine

## 2021-08-24 NOTE — Telephone Encounter (Signed)
If the shot depomedrol 40 mg x 1 helped  She can schedule a nurse visit for another shot of the same asap  Use clobetasol cream  If not better let me know we can refer urgently to dermatology as well

## 2021-08-24 NOTE — Telephone Encounter (Signed)
Pt called in stating that she saw Dr. Aundra Dubin for poison ivy and she was given a shot for it and want to know if its recommended for her to get another shot because the poison ivy came back. Pt would like to be called

## 2021-08-25 ENCOUNTER — Ambulatory Visit (INDEPENDENT_AMBULATORY_CARE_PROVIDER_SITE_OTHER): Payer: Medicare Other

## 2021-08-25 DIAGNOSIS — L237 Allergic contact dermatitis due to plants, except food: Secondary | ICD-10-CM

## 2021-08-25 MED ORDER — METHYLPREDNISOLONE ACETATE 40 MG/ML IJ SUSP
40.0000 mg | Freq: Once | INTRAMUSCULAR | Status: AC
  Administered 2021-08-25: 40 mg via INTRAMUSCULAR

## 2021-08-25 NOTE — Progress Notes (Signed)
Patient in office today for Depo-Medrol 40 mg given in left deltoid IM. Patient tolerated well with no signs of distress.

## 2021-09-07 ENCOUNTER — Encounter: Payer: Self-pay | Admitting: Family Medicine

## 2021-09-07 ENCOUNTER — Ambulatory Visit (INDEPENDENT_AMBULATORY_CARE_PROVIDER_SITE_OTHER): Payer: Medicare Other | Admitting: Family Medicine

## 2021-09-07 VITALS — BP 122/84 | HR 66 | Temp 98.4°F | Resp 14 | Ht 65.0 in | Wt 210.2 lb

## 2021-09-07 DIAGNOSIS — L409 Psoriasis, unspecified: Secondary | ICD-10-CM | POA: Diagnosis not present

## 2021-09-07 DIAGNOSIS — D3500 Benign neoplasm of unspecified adrenal gland: Secondary | ICD-10-CM | POA: Diagnosis not present

## 2021-09-07 DIAGNOSIS — R0781 Pleurodynia: Secondary | ICD-10-CM | POA: Diagnosis not present

## 2021-09-07 DIAGNOSIS — B372 Candidiasis of skin and nail: Secondary | ICD-10-CM

## 2021-09-07 DIAGNOSIS — D3502 Benign neoplasm of left adrenal gland: Secondary | ICD-10-CM

## 2021-09-07 DIAGNOSIS — I10 Essential (primary) hypertension: Secondary | ICD-10-CM | POA: Diagnosis not present

## 2021-09-07 DIAGNOSIS — R0789 Other chest pain: Secondary | ICD-10-CM

## 2021-09-07 NOTE — Progress Notes (Signed)
Tommi Rumps, MD Phone: [phone]  Alexandra Bishop is a 76 y.o. female who presents today for f/u.  HYPERTENSION Disease Monitoring Home BP Monitoring not checking Chest pain- no    Dyspnea- no Medications Compliance-  taking amlodipine, losartan.  Edema- no BMET    Component Value Date/Time   NA 139 06/03/2021 1059   K 4.2 06/03/2021 1059   CL 107 06/03/2021 1059   CO2 24 06/03/2021 1059   GLUCOSE 98 06/03/2021 1059   BUN 16 06/03/2021 1059   CREATININE 0.90 06/03/2021 1059   CALCIUM 9.6 06/03/2021 1059   Psoriasis: Patient notes she is back on [place].  She requests referral to a new dermatologist locally.  Adrenal adenoma: Patient is due for follow-up imaging.  Inguinal rash: This has been going on several days.  It is sore.  It does not itch.  Notes this happens this time of year typically.  Torso pain: Patient reports for some time now she has had some discomfort in her anterior torso in her lower rib area.  She notes this hurts when she rolls over in bed and does have some pain in that area when she walks.  She notes the pain only occurs when she moves.  She notes left is greater than right.  No specific injury.  No specific change to activity.  She notes no vomiting or diarrhea.  She noted a little constipation though she had a good bowel movement yesterday.  She notes no dysuria or urinary frequency.  No change to chronic urinary urgency.  No vaginal bleeding.  She has not tried any medicines for this.  Social History   Tobacco Use  Smoking Status Never  Smokeless Tobacco Never  Tobacco Comments   tobacco use - no     Current Outpatient Medications on File Prior to Visit  Medication Sig Dispense Refill   amLODipine (NORVASC) 10 MG tablet      aspirin EC 81 MG tablet Take 81 mg by mouth every other day.     atorvastatin (LIPITOR) 20 MG tablet Take 1 tablet (20 mg total) by mouth daily. 90 tablet 3   CALCIUM-MAGNESIUM-ZINC PO Take by mouth. Twice weekly      clobetasol cream (TEMOVATE) 0.05 % Apply topically 2 (two) times daily. Arms and legs for poison ivy as needed 60 g 2   cyanocobalamin 500 MCG tablet Take 500 mcg by mouth once a week.     ELIDEL 1 % cream      fluocinonide (LIDEX) 0.05 % external solution Apply topically.     gabapentin (NEURONTIN) 100 MG capsule TAKE ONE CAPSULE BY MOUTH FOUR TIMES A DAY 90 capsule 1   ketoconazole (NIZORAL) 2 % cream Apply topically.     levothyroxine (EUTHYROX) 88 MCG tablet Take 1 tablet (88 mcg total) by mouth daily. 90 tablet 3   losartan (COZAAR) 50 MG tablet TAKE 1 TABLET BY MOUTH EVERY DAY 90 tablet 3   Multiple Vitamin (MULTIVITAMIN) tablet Take 1 tablet by mouth every other day.     nitroGLYCERIN (NITROGLYN) 2 % ointment Place onto the skin.     nortriptyline (PAMELOR) 10 MG capsule Take by mouth.     polyethylene glycol powder (GLYCOLAX/MIRALAX) 17 GM/SCOOP powder Take 17 g by mouth daily as needed for mild constipation. 500 g 0   Potassium Gluconate 595 MG CAPS Take by mouth every other day.     SKYRIZI PEN 150 MG/ML SOAJ      chlorhexidine (PERIDEX) 0.12 % solution  (  Patient not taking: Reported on 09/07/2021)     fluconazole (DIFLUCAN) 150 MG tablet Take 150 mg by mouth daily.     hydrocortisone 2.5 % cream Apply 1 application topically 2 (two) times daily as needed. (Patient not taking: Reported on 09/07/2021)     Hydrocortisone Acetate 1 % CREA  (Patient not taking: Reported on 09/07/2021)     levothyroxine (SYNTHROID) 100 MCG tablet  (Patient not taking: Reported on 09/07/2021)     No current facility-administered medications on file prior to visit.     ROS see history of present illness  Objective  Physical Exam Vitals:   09/07/21 1442  BP: 122/84  Pulse: 66  Resp: 14  Temp: 98.4 F (36.9 C)  SpO2: 96%    BP Readings from Last 3 Encounters:  09/07/21 122/84  08/19/21 124/80  06/03/21 122/80   Wt Readings from Last 3 Encounters:  09/07/21 210 lb 3.2 oz (95.3 kg)  08/19/21  218 lb 6.4 oz (99.1 kg)  07/03/21 224 lb (101.6 kg)    Physical Exam Constitutional:      General: She is not in acute distress.    Appearance: She is not diaphoretic.  Cardiovascular:     Rate and Rhythm: Normal rate and regular rhythm.     Heart sounds: Normal heart sounds.  Pulmonary:     Effort: Pulmonary effort is normal.     Breath sounds: Normal breath sounds.  Chest:    Abdominal:     General: Bowel sounds are normal. There is no distension.     Palpations: Abdomen is soft.     Tenderness: There is no abdominal tenderness.  Genitourinary:    Comments: Cresenciano Genre, CMA served as chaperone, erythematous right inguinal rash noted Skin:    General: Skin is warm and dry.  Neurological:     Mental Status: She is alert.      Assessment/Plan: Please see individual problem list.  Problem List Items Addressed This Visit     Hypertension (Chronic)    Adequately controlled.  She will continue losartan 50 mg daily and amlodipine 10 mg daily.      Psoriasis (Chronic)    Referral to local dermatology placed.      Relevant Orders   Ambulatory referral to Dermatology   Adrenal adenoma    Follow-up imaging ordered.      Relevant Orders   CT ADRENAL ABD WO   Candidal intertrigo    I suspect the area of concern in her right inguinal area is candidal intertrigo.  She will trial ketoconazole twice daily.  She already has a tube of this.  If its not beneficial by the end of the week she will let us know and we can send in nystatin.      Rib pain - Primary    I suspect the patient has either costochondritis or has strained the muscles between her ribs.  Discussed anti-inflammatories as appropriate treatment for this.  She will trial over-the-counter Aleve 1 tablet twice daily for 5 days taken with food.  Advised if the 1 tablet twice daily is not helpful she can try 2 tablets over-the-counter twice daily for a total of no more than 5 days.  If that is not beneficial she  will let us know.      Other Visit Diagnoses     Benign neoplasm of left adrenal gland       Relevant Orders   CT ADRENAL ABD WO  Return in about 3 months (around 12/08/2021).   Tommi Rumps, MD [place]

## 2021-09-07 NOTE — Assessment & Plan Note (Signed)
Adequately controlled.  She will continue losartan 50 mg daily and amlodipine 10 mg daily.

## 2021-09-07 NOTE — Patient Instructions (Signed)
Nice to see you. We will refer you to a new dermatologist. You can try over-the-counter Aleve 1 tablet twice daily for 5 days.  If the 1 tablet is not beneficial you can take 2 tablets at a time twice daily for 5 days.  You need to take this with food.  If it irritates your stomach please discontinue it. We will try to get a CT scan to follow-up on the adrenal adenoma.

## 2021-09-07 NOTE — Assessment & Plan Note (Signed)
Referral to local dermatology placed.

## 2021-09-07 NOTE — Assessment & Plan Note (Signed)
Follow up imaging ordered

## 2021-09-07 NOTE — Assessment & Plan Note (Signed)
I suspect the area of concern in her right inguinal area is candidal intertrigo.  She will trial ketoconazole twice daily.  She already has a tube of this.  If its not beneficial by the end of the week she will let us know and we can send in nystatin.

## 2021-09-07 NOTE — Assessment & Plan Note (Signed)
I suspect the patient has either costochondritis or has strained the muscles between her ribs.  Discussed anti-inflammatories as appropriate treatment for this.  She will trial over-the-counter Aleve 1 tablet twice daily for 5 days taken with food.  Advised if the 1 tablet twice daily is not helpful she can try 2 tablets over-the-counter twice daily for a total of no more than 5 days.  If that is not beneficial she will let us know.

## 2021-09-10 ENCOUNTER — Ambulatory Visit
Admission: RE | Admit: 2021-09-10 | Discharge: 2021-09-10 | Disposition: A | Discharge status: Home / Self Care | Payer: Medicare Other | Source: Ambulatory Visit | Attending: Family Medicine | Admitting: Family Medicine

## 2021-09-10 DIAGNOSIS — D3502 Benign neoplasm of left adrenal gland: Secondary | ICD-10-CM | POA: Diagnosis not present

## 2021-09-10 DIAGNOSIS — D3500 Benign neoplasm of unspecified adrenal gland: Secondary | ICD-10-CM | POA: Insufficient documentation

## 2021-09-10 DIAGNOSIS — K5730 Diverticulosis of large intestine without perforation or abscess without bleeding: Secondary | ICD-10-CM | POA: Diagnosis not present

## 2021-09-10 DIAGNOSIS — K8689 Other specified diseases of pancreas: Secondary | ICD-10-CM | POA: Diagnosis not present

## 2021-09-10 DIAGNOSIS — K828 Other specified diseases of gallbladder: Secondary | ICD-10-CM | POA: Diagnosis not present

## 2021-09-10 DIAGNOSIS — K429 Umbilical hernia without obstruction or gangrene: Secondary | ICD-10-CM | POA: Diagnosis not present

## 2021-09-29 ENCOUNTER — Other Ambulatory Visit: Payer: Self-pay | Admitting: Family Medicine

## 2021-10-04 ENCOUNTER — Other Ambulatory Visit: Payer: Self-pay | Admitting: Family Medicine

## 2021-10-04 DIAGNOSIS — M1990 Unspecified osteoarthritis, unspecified site: Secondary | ICD-10-CM

## 2021-10-16 ENCOUNTER — Telehealth: Payer: Self-pay | Admitting: Family Medicine

## 2021-10-16 NOTE — Telephone Encounter (Signed)
Patient walked in, she stated that she wanted to let the provider know that she does not want to change her dermatologist.

## 2021-10-16 NOTE — Telephone Encounter (Signed)
Noted  

## 2021-11-27 ENCOUNTER — Other Ambulatory Visit: Payer: Self-pay | Admitting: Family Medicine

## 2021-11-27 DIAGNOSIS — E039 Hypothyroidism, unspecified: Secondary | ICD-10-CM

## 2021-12-05 IMAGING — MR MR CERVICAL SPINE W/O CM
6 of 9 series · 27 of 48 positions shown · non-contrast
Comparison: None.

CLINICAL DATA: Headache.

EXAM:
MRI CERVICAL SPINE WITHOUT CONTRAST
TECHNIQUE: Multiplanar, multisequence MR imaging of the cervical spine was
performed. No intravenous contrast was administered.

[Series 5: T1 · sagittal · 3.0mm · 0.69mm/px · 3 of 15 slices shown (1 of 2)]
[im 1/15]
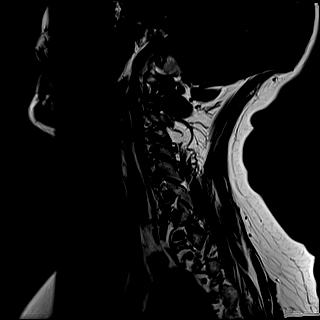
[im 8/15]
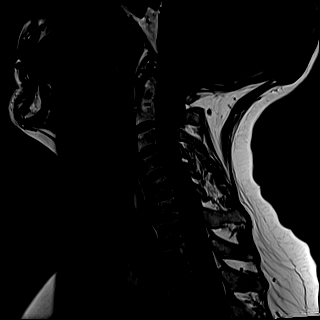
[im 15/15]
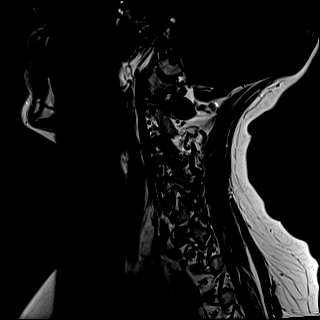

[Series 6: T2 · sagittal · 3.0mm · 0.69mm/px · 3 of 15 slices shown (1 of 2)]
[im 1/15]
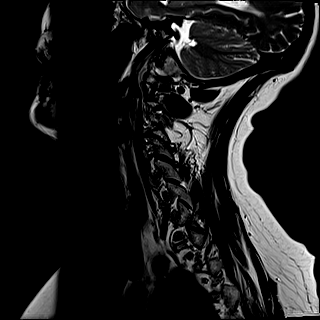
[im 8/15]
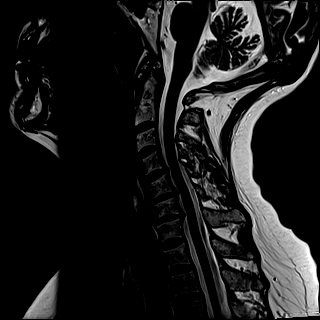
[im 15/15]
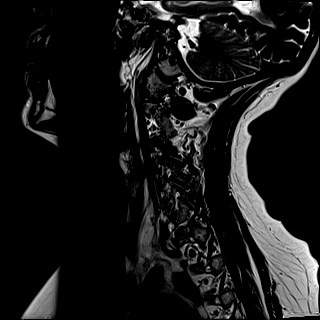

[Series 7: STIR · sagittal · 3.0mm · 0.86mm/px · 3 of 15 slices shown]
[im 1/15]
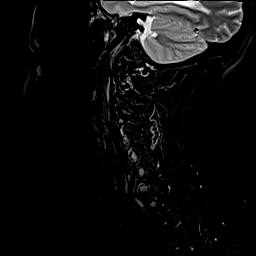
[im 8/15]
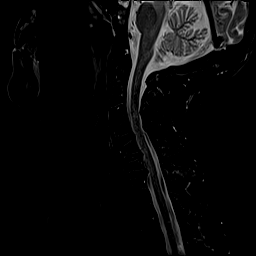
[im 15/15]
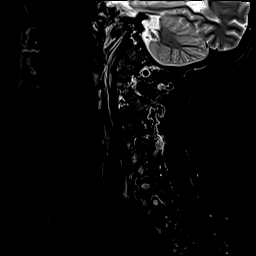

[Series 8: T1 · axial · 3.0mm · 0.35mm/px · z∈[-41,+79]mm · 6 of 36 slices shown (2 of 2)]
[im 1/36]
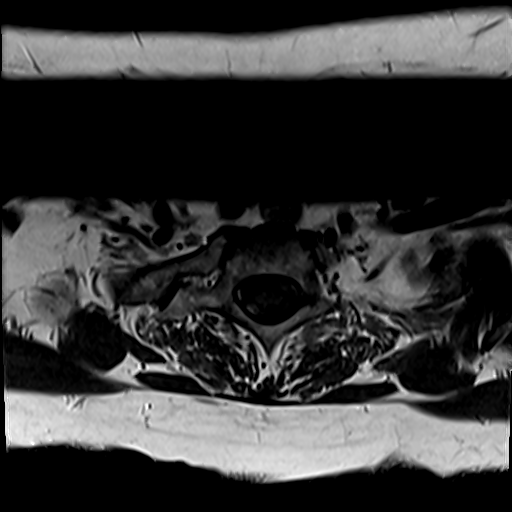
[im 8/36]
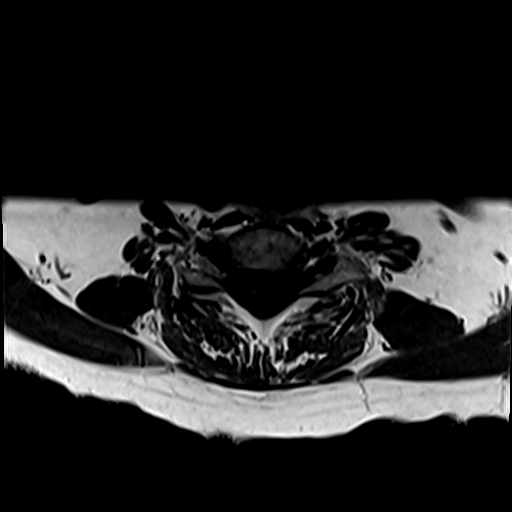
[im 15/36]
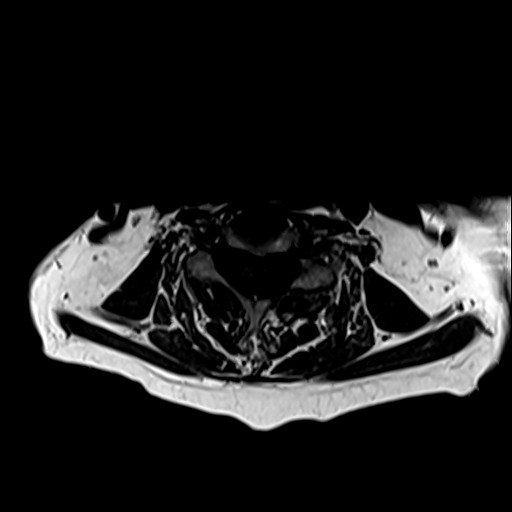
[im 22/36]
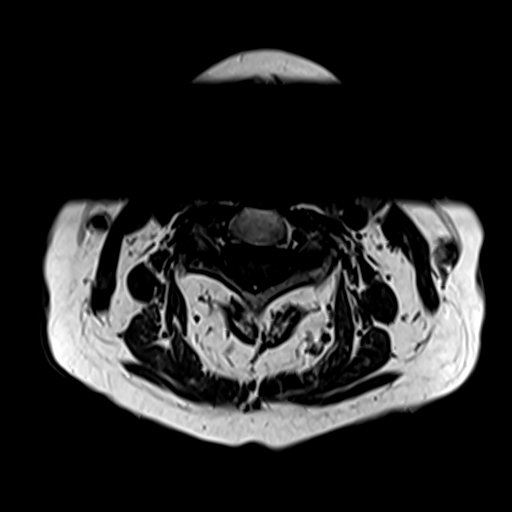
[im 29/36]
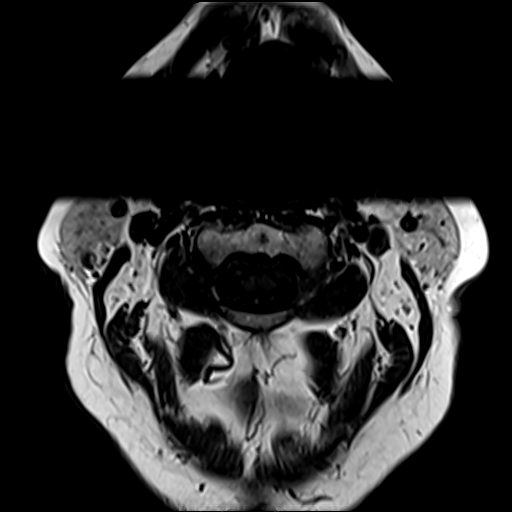
[im 36/36]
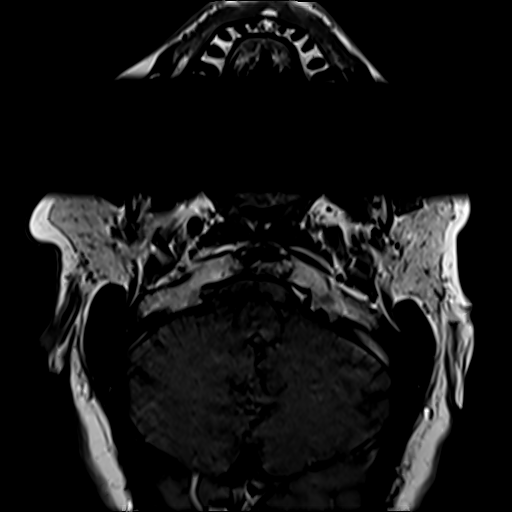

[Series 9: T2 · axial · 3.0mm · 0.70mm/px · z∈[-41,+79]mm · 6 of 36 slices shown (2 of 2)]
[im 1/36]
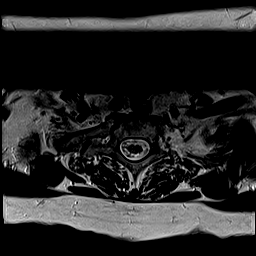
[im 8/36]
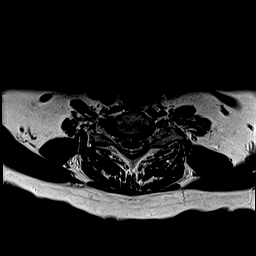
[im 15/36]
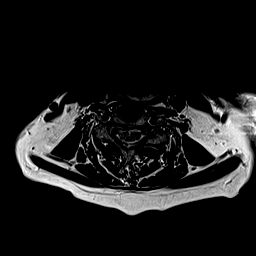
[im 22/36]
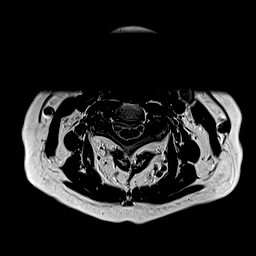
[im 29/36]
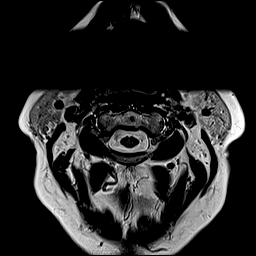
[im 36/36]
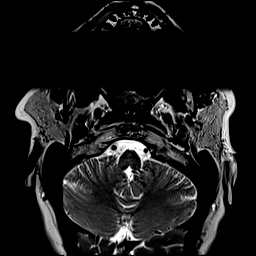

[Series 10: GRE · axial · 3.0mm · 0.35mm/px · z∈[-43,+77]mm · 6 of 36 slices shown]
[im 1/36]
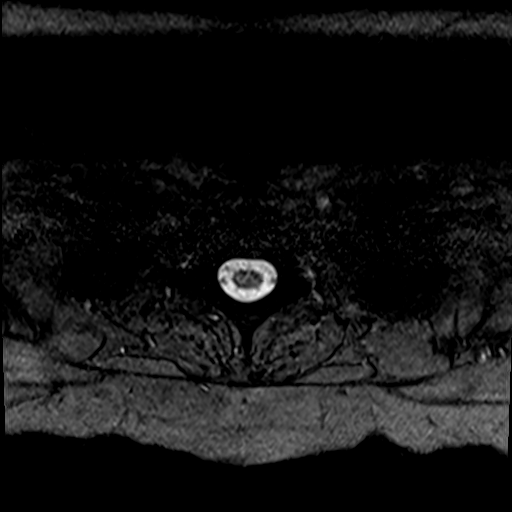
[im 8/36]
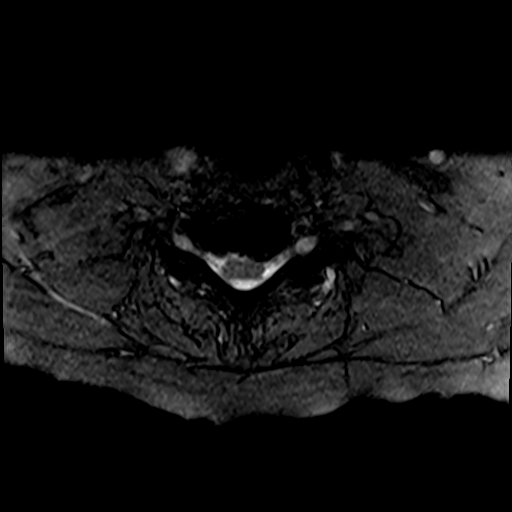
[im 15/36]
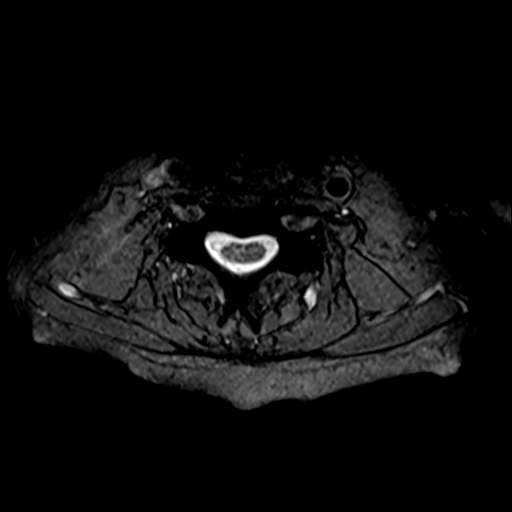
[im 22/36]
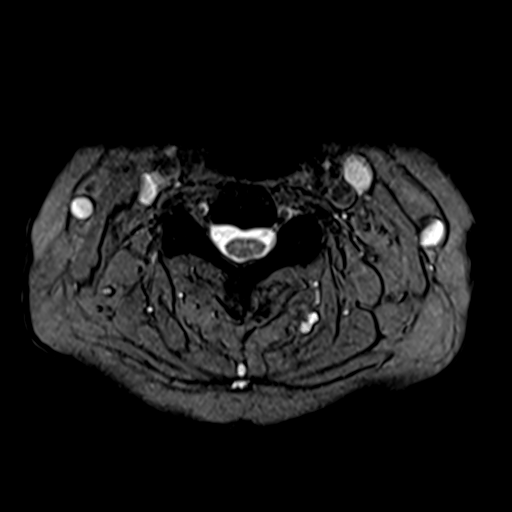
[im 29/36]
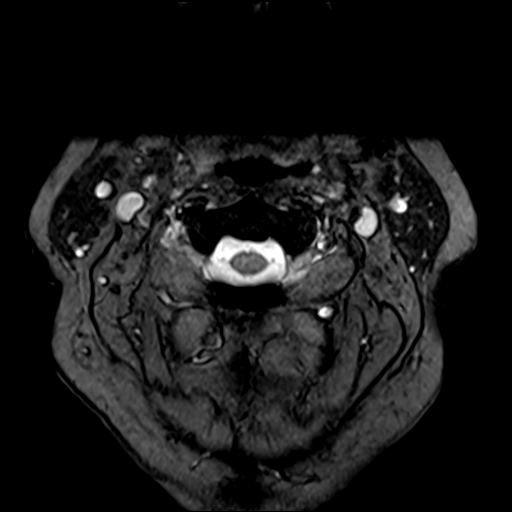
[im 36/36]
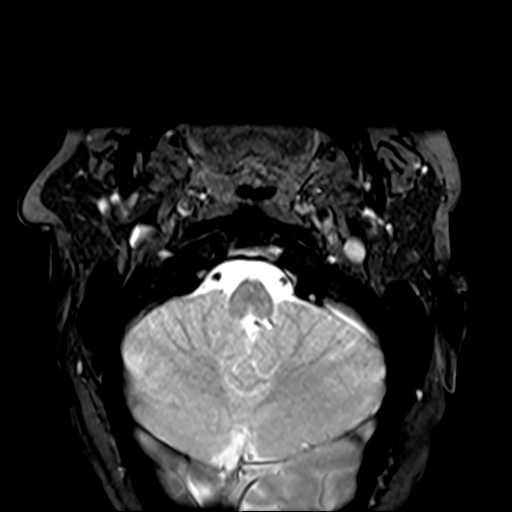

[27 of 48 positions shown; findings below may reference images not displayed]

FINDINGS: Alignment: Slight reversal of cervical lordosis with minimal grade 1
C4-5 anterolisthesis.

Vertebrae: Normal bone marrow signal intensity. No focal osseous
lesion.

Cord: Normal signal and morphology.

Posterior Fossa, vertebral arteries: Negative.

Disc levels: Multilevel desiccation with mild C5-6 and C6-7 disc
space loss.

C2-3: No significant disc bulge, spinal canal or neural foraminal
narrowing.

C3-4: Small central protrusion with uncovertebral and facet
degenerative spurring. Mild left neural foraminal narrowing.

C4-5: Small central protrusion with uncovertebral and bilateral
facet degenerative spurring. Mild spinal canal narrowing. No
significant neural foraminal narrowing.

C5-6: Disc osteophyte complex with superimposed right paracentral
protrusion abutting the ventral cord, uncovertebral and bilateral
facet hypertrophy. Mild spinal canal and right neural foraminal
narrowing.

C6-7: Disc osteophyte complex with superimposed central protrusion,
uncovertebral and bilateral facet degenerative spurring. Mild spinal
canal and bilateral neural foraminal narrowing.

C7-T1: No significant disc bulge, spinal canal or neural foraminal
narrowing.

Paraspinal tissues: Within normal limits.
IMPRESSION: Mild multilevel spondylosis with reversal of lordosis and minimal
grade 1 C4-5 anterolisthesis.

Mild C4-7 spinal canal narrowing.

Mild left C3-4, right C5-6 and bilateral C6-7 neural foraminal
narrowing.

## 2021-12-07 DIAGNOSIS — I7300 Raynaud's syndrome without gangrene: Secondary | ICD-10-CM | POA: Diagnosis not present

## 2021-12-07 DIAGNOSIS — L409 Psoriasis, unspecified: Secondary | ICD-10-CM | POA: Diagnosis not present

## 2021-12-09 ENCOUNTER — Encounter: Payer: Self-pay | Admitting: Family Medicine

## 2021-12-09 ENCOUNTER — Ambulatory Visit (INDEPENDENT_AMBULATORY_CARE_PROVIDER_SITE_OTHER): Payer: Medicare Other | Admitting: Family Medicine

## 2021-12-09 VITALS — BP 130/70 | HR 66 | Temp 98.9°F | Ht 65.0 in | Wt 205.2 lb

## 2021-12-09 DIAGNOSIS — M25559 Pain in unspecified hip: Secondary | ICD-10-CM | POA: Diagnosis not present

## 2021-12-09 DIAGNOSIS — G8929 Other chronic pain: Secondary | ICD-10-CM | POA: Diagnosis not present

## 2021-12-09 DIAGNOSIS — R7303 Prediabetes: Secondary | ICD-10-CM | POA: Diagnosis not present

## 2021-12-09 DIAGNOSIS — Z23 Encounter for immunization: Secondary | ICD-10-CM

## 2021-12-09 DIAGNOSIS — I10 Essential (primary) hypertension: Secondary | ICD-10-CM

## 2021-12-09 DIAGNOSIS — L409 Psoriasis, unspecified: Secondary | ICD-10-CM

## 2021-12-09 NOTE — Assessment & Plan Note (Signed)
Check A1c.  Continue exercise.

## 2021-12-09 NOTE — Progress Notes (Signed)
Tommi Rumps, MD Phone: [phone]  Alexandra Bishop is a 76 y.o. female who presents today for f/u.  HYPERTENSION Disease Monitoring Home BP Monitoring not checking Chest pain- no    Dyspnea- no Medications Compliance-  taking amlodipine, losartan.  Edema- no BMET    Component Value Date/Time   NA 139 06/03/2021 1059   K 4.2 06/03/2021 1059   CL 107 06/03/2021 1059   CO2 24 06/03/2021 1059   GLUCOSE 98 06/03/2021 1059   BUN 16 06/03/2021 1059   CREATININE 0.90 06/03/2021 1059   CALCIUM 9.6 06/03/2021 1059   Psoriasis: Patient is on [employer].  She is not sure if this has been helpful or not.  She will start seeing a new dermatologist in the new year.  Prediabetes: She has been staying more active in her garden and yard.  Right hip pain: This has been an ongoing issue that has bothered her a little more recently.  Does not occur daily.  Notes it is an aching pain.  No catching or popping.  She notes her GYN started her on gabapentin 1 in the morning and 2 at night to see if that would help.  Patient came in wanting a COVID test just to see if she had COVID.  She notes no symptoms.  She notes no exposures.  Social History   Tobacco Use  Smoking Status Never  Smokeless Tobacco Never  Tobacco Comments   tobacco use - no     Current Outpatient Medications on File Prior to Visit  Medication Sig Dispense Refill   amLODipine (NORVASC) 10 MG tablet      aspirin EC 81 MG tablet Take 81 mg by mouth every other day.     atorvastatin (LIPITOR) 20 MG tablet TAKE 1 TABLET BY MOUTH EVERY DAY 90 tablet 3   CALCIUM-MAGNESIUM-ZINC PO Take by mouth. Twice weekly     clobetasol cream (TEMOVATE) 0.05 % Apply topically 2 (two) times daily. Arms and legs for poison ivy as needed 60 g 2   cyanocobalamin 500 MCG tablet Take 500 mcg by mouth once a week.     ELIDEL 1 % cream      fluconazole (DIFLUCAN) 150 MG tablet Take 150 mg by mouth daily.     fluocinonide (LIDEX) 0.05 % external solution  Apply topically.     gabapentin (NEURONTIN) 100 MG capsule TAKE ONE CAPSULE BY MOUTH FOUR TIMES A DAY 90 capsule 1   ketoconazole (NIZORAL) 2 % cream Apply topically.     levothyroxine (SYNTHROID) 88 MCG tablet TAKE 1 TABLET BY MOUTH DAILY 90 tablet 3   losartan (COZAAR) 50 MG tablet TAKE 1 TABLET BY MOUTH EVERY DAY 90 tablet 3   Multiple Vitamin (MULTIVITAMIN) tablet Take 1 tablet by mouth every other day.     nortriptyline (PAMELOR) 10 MG capsule Take by mouth.     polyethylene glycol powder (GLYCOLAX/MIRALAX) 17 GM/SCOOP powder Take 17 g by mouth daily as needed for mild constipation. 500 g 0   Potassium Gluconate 595 MG CAPS Take by mouth every other day.     SKYRIZI PEN 150 MG/ML SOAJ      nitroGLYCERIN (NITROGLYN) 2 % ointment Place onto the skin.     No current facility-administered medications on file prior to visit.     ROS see history of present illness  Objective  Physical Exam Vitals:   12/09/21 1407  BP: 130/70  Pulse: 66  Temp: 98.9 F (37.2 C)  SpO2: 99%  BP Readings from Last 3 Encounters:  12/09/21 130/70  09/07/21 122/84  08/19/21 124/80   Wt Readings from Last 3 Encounters:  12/09/21 205 lb 3.2 oz (93.1 kg)  09/07/21 210 lb 3.2 oz (95.3 kg)  08/19/21 218 lb 6.4 oz (99.1 kg)    Physical Exam Constitutional:      General: She is not in acute distress.    Appearance: She is not diaphoretic.  Cardiovascular:     Rate and Rhythm: Normal rate and regular rhythm.     Heart sounds: Normal heart sounds.  Pulmonary:     Effort: Pulmonary effort is normal.     Breath sounds: Normal breath sounds.  Musculoskeletal:     Comments: Good internal and external range of motion bilateral hips, right lateral hip with no tenderness, slight tenderness on the left lateral hip over the greater trochanter  Skin:    General: Skin is warm and dry.  Neurological:     Mental Status: She is alert.      Assessment/Plan: Please see individual problem list.  Problem  List Items Addressed This Visit     Chronic hip pain - Primary (Chronic)    Likely osteoarthritis though she could have some measure of trochanteric bursitis or IT band syndrome.  She notes she is already doing exercises from physical therapy for her hip.  I encouraged her to continue with that.  Discussed continuing gabapentin 100 mg in the morning and 200 mg at night.  Discussed we could increase that dose if she would like in the future.  She can also take over-the-counter Tylenol for pain.      Hypertension (Chronic)    Well-controlled.  She will continue losartan 50 mg daily and amlodipine 10 mg daily.  Check BMP.      Relevant Orders   Basic Metabolic Panel (BMET)   Prediabetes (Chronic)    Check A1c.  Continue exercise.      Relevant Orders   HgB A1c   Psoriasis (Chronic)    She will continue to follow with dermatology.      Other Visit Diagnoses     Need for immunization against influenza       Relevant Orders   Flu Vaccine QUAD High Dose(Fluad) (Completed)      I advised the patient that there is no role for a COVID test given that she does not have symptoms and has not had any exposures.  Discussed I was not sure that her insurance would not pay for the COVID test.  Discussed that if it was positive she will have to quarantine at home though would not get any oral COVID medications given her lack of symptoms.  She understood this and noted she did not need any testing at this time.  Return in about 3 months (around 03/10/2022).   Tommi Rumps, MD [place]

## 2021-12-09 NOTE — Assessment & Plan Note (Signed)
Likely osteoarthritis though she could have some measure of trochanteric bursitis or IT band syndrome.  She notes she is already doing exercises from physical therapy for her hip.  I encouraged her to continue with that.  Discussed continuing gabapentin 100 mg in the morning and 200 mg at night.  Discussed we could increase that dose if she would like in the future.  She can also take over-the-counter Tylenol for pain.

## 2021-12-09 NOTE — Assessment & Plan Note (Signed)
Well-controlled.  She will continue losartan 50 mg daily and amlodipine 10 mg daily.  Check BMP.

## 2021-12-09 NOTE — Assessment & Plan Note (Signed)
She will continue to follow with dermatology.

## 2021-12-09 NOTE — Patient Instructions (Signed)
Nice to see you. We will get lab work today and contact you with the results. 

## 2021-12-10 ENCOUNTER — Other Ambulatory Visit (INDEPENDENT_AMBULATORY_CARE_PROVIDER_SITE_OTHER): Payer: Medicare Other

## 2021-12-10 DIAGNOSIS — R7303 Prediabetes: Secondary | ICD-10-CM

## 2021-12-10 LAB — BASIC METABOLIC PANEL
BUN: 16 mg/dL (ref 6–23)
CO2: 25 mEq/L (ref 19–32)
Calcium: 9.3 mg/dL (ref 8.4–10.5)
Chloride: 106 mEq/L (ref 96–112)
Creatinine, Ser: 0.99 mg/dL (ref 0.40–1.20)
GFR: 54.68 mL/min — ABNORMAL LOW (ref >60.00)
Glucose, Bld: 90 mg/dL (ref 70–99)
Potassium: 4.3 mEq/L (ref 3.5–5.1)
Sodium: 139 mEq/L (ref 135–145)

## 2021-12-14 DIAGNOSIS — D2371 Other benign neoplasm of skin of right lower limb, including hip: Secondary | ICD-10-CM | POA: Diagnosis not present

## 2021-12-14 DIAGNOSIS — L400 Psoriasis vulgaris: Secondary | ICD-10-CM | POA: Diagnosis not present

## 2021-12-14 DIAGNOSIS — L821 Other seborrheic keratosis: Secondary | ICD-10-CM | POA: Diagnosis not present

## 2021-12-14 LAB — HEMOGLOBIN A1C: Hgb A1c MFr Bld: 5.8 % (ref 4.6–6.5)

## 2021-12-24 DIAGNOSIS — Z23 Encounter for immunization: Secondary | ICD-10-CM | POA: Diagnosis not present

## 2021-12-27 ENCOUNTER — Telehealth: Payer: Self-pay | Admitting: Family Medicine

## 2021-12-27 DIAGNOSIS — M1990 Unspecified osteoarthritis, unspecified site: Secondary | ICD-10-CM

## 2021-12-27 DIAGNOSIS — I10 Essential (primary) hypertension: Secondary | ICD-10-CM

## 2021-12-28 MED ORDER — GABAPENTIN 100 MG PO CAPS: ORAL_CAPSULE | ORAL | 1 refills | Status: DC

## 2021-12-28 NOTE — Telephone Encounter (Signed)
Medications sent to pharmacy.  Eleri Ruben,cma  

## 2021-12-28 NOTE — Telephone Encounter (Signed)
Pt need a refill on gabapentin sent to [facility] 

## 2021-12-31 DIAGNOSIS — I10 Essential (primary) hypertension: Secondary | ICD-10-CM | POA: Diagnosis not present

## 2021-12-31 DIAGNOSIS — K645 Perianal venous thrombosis: Secondary | ICD-10-CM | POA: Diagnosis not present

## 2022-01-18 ENCOUNTER — Encounter (INDEPENDENT_AMBULATORY_CARE_PROVIDER_SITE_OTHER): Payer: Self-pay

## 2022-01-19 DIAGNOSIS — L814 Other melanin hyperpigmentation: Secondary | ICD-10-CM | POA: Diagnosis not present

## 2022-01-19 DIAGNOSIS — F424 Excoriation (skin-picking) disorder: Secondary | ICD-10-CM | POA: Diagnosis not present

## 2022-01-19 DIAGNOSIS — D1801 Hemangioma of skin and subcutaneous tissue: Secondary | ICD-10-CM | POA: Diagnosis not present

## 2022-01-19 DIAGNOSIS — L409 Psoriasis, unspecified: Secondary | ICD-10-CM | POA: Diagnosis not present

## 2022-01-19 DIAGNOSIS — L820 Inflamed seborrheic keratosis: Secondary | ICD-10-CM | POA: Diagnosis not present

## 2022-02-12 ENCOUNTER — Other Ambulatory Visit: Payer: Self-pay | Admitting: Family Medicine

## 2022-02-12 DIAGNOSIS — M1990 Unspecified osteoarthritis, unspecified site: Secondary | ICD-10-CM

## 2022-02-15 ENCOUNTER — Other Ambulatory Visit: Payer: Self-pay | Admitting: Family Medicine

## 2022-02-15 DIAGNOSIS — M1990 Unspecified osteoarthritis, unspecified site: Secondary | ICD-10-CM

## 2022-03-05 ENCOUNTER — Telehealth: Payer: Self-pay

## 2022-03-05 DIAGNOSIS — R7303 Prediabetes: Secondary | ICD-10-CM

## 2022-03-05 DIAGNOSIS — I10 Essential (primary) hypertension: Secondary | ICD-10-CM

## 2022-03-05 DIAGNOSIS — E039 Hypothyroidism, unspecified: Secondary | ICD-10-CM

## 2022-03-05 NOTE — Telephone Encounter (Signed)
Orders placed.  Hall Birchard,cma

## 2022-03-05 NOTE — Addendum Note (Signed)
Addended by: Fulton Mole D on: 03/05/2022 03:55 PM   Modules accepted: Orders

## 2022-03-05 NOTE — Telephone Encounter (Signed)
Pt called in stating she called previously about getting labs done before her wednesday appt with Dr. Biagio Quint.  She stated she is diabetic and her appt is a 3 month f/u on Wednesday and would like to be scheduled before then.    I scheduled pt on 03/08/22 @ 11:15

## 2022-03-08 ENCOUNTER — Other Ambulatory Visit (INDEPENDENT_AMBULATORY_CARE_PROVIDER_SITE_OTHER): Payer: Medicare Other

## 2022-03-08 DIAGNOSIS — I10 Essential (primary) hypertension: Secondary | ICD-10-CM

## 2022-03-08 DIAGNOSIS — E039 Hypothyroidism, unspecified: Secondary | ICD-10-CM | POA: Diagnosis not present

## 2022-03-08 DIAGNOSIS — R7303 Prediabetes: Secondary | ICD-10-CM

## 2022-03-08 LAB — COMPREHENSIVE METABOLIC PANEL
ALT: 21 U/L (ref 0–35)
AST: 25 U/L (ref 0–37)
Albumin: 4.3 g/dL (ref 3.5–5.2)
Alkaline Phosphatase: 88 U/L (ref 39–117)
BUN: 15 mg/dL (ref 6–23)
CO2: 23 mEq/L (ref 19–32)
Calcium: 9.4 mg/dL (ref 8.4–10.5)
Chloride: 108 mEq/L (ref 96–112)
Creatinine, Ser: 0.87 mg/dL (ref 0.40–1.20)
GFR: 63.74 mL/min (ref >60.00)
Glucose, Bld: 94 mg/dL (ref 70–99)
Potassium: 4.6 mEq/L (ref 3.5–5.1)
Sodium: 142 mEq/L (ref 135–145)
Total Bilirubin: 1 mg/dL (ref 0.2–1.2)
Total Protein: 6.4 g/dL (ref 6.0–8.3)

## 2022-03-08 LAB — TSH: TSH: 1.91 u[IU]/mL (ref 0.35–5.50)

## 2022-03-08 LAB — HEMOGLOBIN A1C: Hgb A1c MFr Bld: 5.8 % (ref 4.6–6.5)

## 2022-03-10 ENCOUNTER — Encounter: Payer: Self-pay | Admitting: Family Medicine

## 2022-03-10 ENCOUNTER — Ambulatory Visit (INDEPENDENT_AMBULATORY_CARE_PROVIDER_SITE_OTHER): Payer: Medicare Other | Admitting: Family Medicine

## 2022-03-10 VITALS — BP 120/80 | HR 77 | Temp 98.0°F | Ht 65.0 in | Wt 208.4 lb

## 2022-03-10 DIAGNOSIS — M542 Cervicalgia: Secondary | ICD-10-CM | POA: Diagnosis not present

## 2022-03-10 DIAGNOSIS — L409 Psoriasis, unspecified: Secondary | ICD-10-CM | POA: Diagnosis not present

## 2022-03-10 DIAGNOSIS — G8929 Other chronic pain: Secondary | ICD-10-CM | POA: Insufficient documentation

## 2022-03-10 DIAGNOSIS — K649 Unspecified hemorrhoids: Secondary | ICD-10-CM

## 2022-03-10 DIAGNOSIS — E785 Hyperlipidemia, unspecified: Secondary | ICD-10-CM

## 2022-03-10 DIAGNOSIS — I10 Essential (primary) hypertension: Secondary | ICD-10-CM

## 2022-03-10 DIAGNOSIS — R0982 Postnasal drip: Secondary | ICD-10-CM | POA: Diagnosis not present

## 2022-03-10 HISTORY — DX: Postnasal drip: R09.82

## 2022-03-10 LAB — POC COVID19 BINAXNOW: SARS Coronavirus 2 Ag: NEGATIVE

## 2022-03-10 NOTE — Assessment & Plan Note (Signed)
She will monitor for recurrence.  We could refer to GI for further treatment if this is a frequent issue.

## 2022-03-10 NOTE — Assessment & Plan Note (Signed)
Chronic issue.  Continue Lipitor 20 mg daily.  Check lipid panel today.

## 2022-03-10 NOTE — Assessment & Plan Note (Signed)
Discussed that this could represent COVID, another viral illness, or allergies.  Rapid COVID test completed today.  I advised that she do another COVID test on Friday and if positive she should contact us or get evaluated.

## 2022-03-10 NOTE — Patient Instructions (Signed)
Nice to see you. Please do a home COVID test on Friday.  If it is positive please contact us or be evaluated at the walk-in clinic or urgent care.

## 2022-03-10 NOTE — Assessment & Plan Note (Addendum)
Chronic issue.  Adequately controlled.  She will continue amlodipine 10 mg daily and losartan 50 mg daily.

## 2022-03-10 NOTE — Assessment & Plan Note (Signed)
It is okay for the patient to continue gabapentin 200 mg at bedtime, 200 mg in the middle the night, and 200 mg in the morning.  She will monitor for drowsiness with this.

## 2022-03-10 NOTE — Assessment & Plan Note (Signed)
She will continue to see dermatology.

## 2022-03-10 NOTE — Progress Notes (Signed)
Tommi Rumps, MD Phone: [phone]  Alexandra Bishop is a 76 y.o. female who presents today for f/u.  HYPERTENSION Disease Monitoring Home BP Monitoring not checking Chest pain- no    Dyspnea- no Medications Compliance-  taking amlodipine , losartan.   Edema- no BMET    Component Value Date/Time   NA 142 03/08/2022 1027   K 4.6 03/08/2022 1027   CL 108 03/08/2022 1027   CO2 23 03/08/2022 1027   GLUCOSE 94 03/08/2022 1027   BUN 15 03/08/2022 1027   CREATININE 0.87 03/08/2022 1027   CALCIUM 9.4 03/08/2022 1027   HYPERLIPIDEMIA Symptoms Chest pain on exertion:  no    Medications: Compliance- taking lipitor Right upper quadrant pain- no  Muscle aches- no Lipid Panel     Component Value Date/Time   CHOL 119 06/11/2020 1342   TRIG 153.0 (H) 06/11/2020 1342   HDL 48.90 06/11/2020 1342   CHOLHDL 2 06/11/2020 1342   VLDL 30.6 06/11/2020 1342   LDLCALC 40 06/11/2020 1342   LDLDIRECT 51.0 11/01/2018 0843   Chronic neck pain: Patient notes this is adequately controlled with 200 mg of gabapentin before bedtime, 100 mg in the middle the night and 100 mg in the morning.  Hemorrhoid: Patient reports she had a hemorrhoid recently.  She was treated at the walk-in clinic for this.  She notes this has improved.  Psoriasis: Patient notes she is in the process of getting in with a new dermatologist consistently.  Throat congestion: Patient reports this started today.  She notes no sore throat.  No cough.  No COVID or flu exposure.  No medications for this.   Social History   Tobacco Use  Smoking Status Never  Smokeless Tobacco Never  Tobacco Comments   tobacco use - no     Current Outpatient Medications on File Prior to Visit  Medication Sig Dispense Refill   amLODipine (NORVASC) 10 MG tablet      aspirin EC 81 MG tablet Take 81 mg by mouth every other day.     atorvastatin (LIPITOR) 20 MG tablet TAKE 1 TABLET BY MOUTH EVERY DAY 90 tablet 3   CALCIUM-MAGNESIUM-ZINC PO Take  by mouth. Twice weekly     clobetasol cream (TEMOVATE) 0.05 % Apply topically 2 (two) times daily. Arms and legs for poison ivy as needed 60 g 2   cyanocobalamin 500 MCG tablet Take 500 mcg by mouth once a week.     ELIDEL 1 % cream      fluconazole (DIFLUCAN) 150 MG tablet Take 150 mg by mouth daily.     fluocinonide (LIDEX) 0.05 % external solution Apply topically.     gabapentin (NEURONTIN) 100 MG capsule TAKE 1 CAPSULE BY MOUTH 4 TIMES A DAY 90 capsule 1   ketoconazole (NIZORAL) 2 % cream Apply topically.     levothyroxine (SYNTHROID) 88 MCG tablet TAKE 1 TABLET BY MOUTH DAILY 90 tablet 3   losartan (COZAAR) 50 MG tablet TAKE 1 TABLET BY MOUTH EVERY DAY 90 tablet 3   Multiple Vitamin (MULTIVITAMIN) tablet Take 1 tablet by mouth every other day.     nortriptyline (PAMELOR) 10 MG capsule Take by mouth.     polyethylene glycol powder (GLYCOLAX/MIRALAX) 17 GM/SCOOP powder Take 17 g by mouth daily as needed for mild constipation. 500 g 0   Potassium Gluconate 595 MG CAPS Take by mouth every other day.     SKYRIZI PEN 150 MG/ML SOAJ      nitroGLYCERIN (NITROGLYN) 2 % ointment  Place onto the skin.     No current facility-administered medications on file prior to visit.     ROS see history of present illness  Objective  Physical Exam Vitals:   03/10/22 1451  BP: 120/80  Pulse: 77  Temp: 98 F (36.7 C)  SpO2: 96%    BP Readings from Last 3 Encounters:  03/10/22 120/80  12/09/21 130/70  09/07/21 122/84   Wt Readings from Last 3 Encounters:  03/10/22 208 lb 6.4 oz (94.5 kg)  12/09/21 205 lb 3.2 oz (93.1 kg)  09/07/21 210 lb 3.2 oz (95.3 kg)    Physical Exam Constitutional:      General: She is not in acute distress.    Appearance: She is not diaphoretic.  HENT:     Mouth/Throat:     Mouth: Mucous membranes are moist.     Comments: Mild posterior oropharyngeal erythema with some postnasal drip Cardiovascular:     Rate and Rhythm: Normal rate and regular rhythm.      Heart sounds: Normal heart sounds.  Pulmonary:     Effort: Pulmonary effort is normal.     Breath sounds: Normal breath sounds.  Lymphadenopathy:     Cervical: No cervical adenopathy.  Skin:    General: Skin is warm and dry.  Neurological:     Mental Status: She is alert.      Assessment/Plan: Please see individual problem list.  Primary hypertension Assessment & Plan: Chronic issue.  Adequately controlled.  She will continue amlodipine 10 mg daily and losartan 50 mg daily.   Hemorrhoids, unspecified hemorrhoid type Assessment & Plan: She will monitor for recurrence.  We could refer to GI for further treatment if this is a frequent issue.   Psoriasis Assessment & Plan: She will continue to see dermatology.   Postnasal drip Assessment & Plan: Discussed that this could represent COVID, another viral illness, or allergies.  Rapid COVID test completed today.  I advised that she do another COVID test on Friday and if positive she should contact us or get evaluated.  Orders: -     POC COVID-19 BinaxNow  Chronic neck pain Assessment & Plan: It is okay for the patient to continue gabapentin 200 mg at bedtime, 200 mg in the middle the night, and 200 mg in the morning.  She will monitor for drowsiness with this.   Hyperlipidemia, unspecified hyperlipidemia type Assessment & Plan: Chronic issue.  Continue Lipitor 20 mg daily.  Check lipid panel today.  Orders: -     Lipid panel    Return in about 3 months (around 06/09/2022).   Tommi Rumps, MD [place]

## 2022-03-11 LAB — LIPID PANEL
Cholesterol: 130 mg/dL (ref 0–200)
HDL: 55.1 mg/dL (ref >39.00)
LDL Cholesterol: 37 mg/dL (ref 0–99)
NonHDL: 74.61
Total CHOL/HDL Ratio: 2
Triglycerides: 188 mg/dL — ABNORMAL HIGH (ref 0.0–149.0)
VLDL: 37.6 mg/dL (ref 0.0–40.0)

## 2022-03-17 ENCOUNTER — Other Ambulatory Visit: Payer: Self-pay | Admitting: Family Medicine

## 2022-03-17 DIAGNOSIS — Z1231 Encounter for screening mammogram for malignant neoplasm of breast: Secondary | ICD-10-CM

## 2022-03-24 ENCOUNTER — Other Ambulatory Visit: Payer: Self-pay | Admitting: Family Medicine

## 2022-03-24 DIAGNOSIS — E039 Hypothyroidism, unspecified: Secondary | ICD-10-CM

## 2022-03-29 ENCOUNTER — Other Ambulatory Visit: Payer: Self-pay | Admitting: Family Medicine

## 2022-03-29 DIAGNOSIS — M1990 Unspecified osteoarthritis, unspecified site: Secondary | ICD-10-CM

## 2022-03-30 ENCOUNTER — Other Ambulatory Visit: Payer: Self-pay | Admitting: Family Medicine

## 2022-03-30 DIAGNOSIS — M1990 Unspecified osteoarthritis, unspecified site: Secondary | ICD-10-CM

## 2022-04-06 ENCOUNTER — Telehealth: Payer: Self-pay

## 2022-04-06 NOTE — Telephone Encounter (Signed)
LVM for patient to call back because we have a refill request from [facility] for Gabapentin but Gabapentin was sent to [facility] on 04/05/2022.  I called to clarify.  Akin Yi,cma

## 2022-04-08 DIAGNOSIS — L409 Psoriasis, unspecified: Secondary | ICD-10-CM | POA: Diagnosis not present

## 2022-04-08 DIAGNOSIS — I7300 Raynaud's syndrome without gangrene: Secondary | ICD-10-CM | POA: Diagnosis not present

## 2022-04-08 NOTE — Telephone Encounter (Signed)
Pt came into the office to inform us that she is going back to [employer]

## 2022-04-09 DIAGNOSIS — L218 Other seborrheic dermatitis: Secondary | ICD-10-CM | POA: Diagnosis not present

## 2022-04-09 DIAGNOSIS — L400 Psoriasis vulgaris: Secondary | ICD-10-CM | POA: Diagnosis not present

## 2022-04-22 DIAGNOSIS — H353131 Nonexudative age-related macular degeneration, bilateral, early dry stage: Secondary | ICD-10-CM | POA: Diagnosis not present

## 2022-04-22 DIAGNOSIS — H2513 Age-related nuclear cataract, bilateral: Secondary | ICD-10-CM | POA: Diagnosis not present

## 2022-04-22 DIAGNOSIS — E119 Type 2 diabetes mellitus without complications: Secondary | ICD-10-CM | POA: Diagnosis not present

## 2022-04-26 ENCOUNTER — Ambulatory Visit (INDEPENDENT_AMBULATORY_CARE_PROVIDER_SITE_OTHER): Payer: Medicare Other

## 2022-04-26 ENCOUNTER — Ambulatory Visit (INDEPENDENT_AMBULATORY_CARE_PROVIDER_SITE_OTHER): Payer: Medicare Other | Admitting: Podiatry

## 2022-04-26 ENCOUNTER — Encounter: Payer: Self-pay | Admitting: Podiatry

## 2022-04-26 VITALS — BP 170/90 | HR 70

## 2022-04-26 DIAGNOSIS — M2042 Other hammer toe(s) (acquired), left foot: Secondary | ICD-10-CM | POA: Diagnosis not present

## 2022-04-26 DIAGNOSIS — I7300 Raynaud's syndrome without gangrene: Secondary | ICD-10-CM

## 2022-04-26 DIAGNOSIS — M2041 Other hammer toe(s) (acquired), right foot: Secondary | ICD-10-CM | POA: Diagnosis not present

## 2022-04-26 MED ORDER — NITROGLYCERIN 2 % TD OINT: TOPICAL_OINTMENT | TRANSDERMAL | 3 refills | Status: AC

## 2022-04-27 NOTE — Progress Notes (Signed)
  Subjective:  Patient ID: Alexandra Bishop, female    DOB: [birth year],  MRN: [id number]  Chief Complaint  Patient presents with   Toe Pain    "My toes get a little painful.  My Dermatologist is questioning it." N - painful toes L - 1-5 b/l D - 25 yrs O - off and on C - sore A - cold weather T - Nitro-bid cream    77 y.o. female presents with the above complaint. History confirmed with patient.  She was previously prescribed nitroglycerin ointment for the toes by her rheumatologist, this is currently expired, she said the 30 g tube was too expensive for her right now to use again she wants to know if there are other options for treating, they feel cold and blue especially during the winter months  Objective:  Physical Exam: no trophic changes or ulcerative lesions, normal DP and PT pulses, normal sensory exam, and cold cyanotic toes consistent with Raynaud's, improves with warming with hands, she has lesser hammertoe deformities that are semireducible.   Radiographs: Multiple views x-ray of both feet: Mild scattered degenerative changes noted in the interphalangeal joints bilateral of the toes Assessment:   1. Hammer toes, bilateral   2. Raynaud's disease without gangrene      Plan:  Patient was evaluated and treated and all questions answered.  Reviewed her x-rays discussed that there is arthritic changes within the toes but do not think is causing her symptoms.  It does appear to be consistent with a Raynaud's syndrome.  She has nitroglycerin ointment and I prescribed her a smaller tube of this that hopefully may be less expensive.  Discussed using warm wool socks to keep her toes warm.  Return to see me as needed for this.  Does not have pain consistent with her hammertoe deformities currently and I would not recommend surgical intervention for this due to risk from Raynaud's  Return if symptoms worsen or fail to improve.

## 2022-04-28 ENCOUNTER — Telehealth: Payer: Self-pay | Admitting: Podiatry

## 2022-04-28 NOTE — Telephone Encounter (Signed)
Patient came into [facility] today. Patient saw Dr Sherryle Lis yesterday and he wrote RX for Nitroglycerin ointment. When pt went to pick it up it was 108.00 individual packets. Pt does not want to pay that much. Pt uses [facility]. Patient states she would like Dr Milinda Pointer on the message.

## 2022-04-29 NOTE — Telephone Encounter (Signed)
Spoke to the pharmacist and they said they don't have anything that comes smaller.

## 2022-05-03 ENCOUNTER — Other Ambulatory Visit: Payer: Self-pay | Admitting: Family Medicine

## 2022-05-03 DIAGNOSIS — E039 Hypothyroidism, unspecified: Secondary | ICD-10-CM

## 2022-05-10 ENCOUNTER — Other Ambulatory Visit: Payer: Self-pay | Admitting: Family Medicine

## 2022-05-10 DIAGNOSIS — M1990 Unspecified osteoarthritis, unspecified site: Secondary | ICD-10-CM

## 2022-05-13 ENCOUNTER — Ambulatory Visit
Admission: RE | Admit: 2022-05-13 | Discharge: 2022-05-13 | Disposition: A | Discharge status: Home / Self Care | Payer: Medicare Other | Source: Ambulatory Visit | Attending: Family Medicine | Admitting: Family Medicine

## 2022-05-13 DIAGNOSIS — Z1231 Encounter for screening mammogram for malignant neoplasm of breast: Secondary | ICD-10-CM | POA: Insufficient documentation

## 2022-06-09 ENCOUNTER — Encounter: Payer: Self-pay | Admitting: Family Medicine

## 2022-06-09 ENCOUNTER — Ambulatory Visit (INDEPENDENT_AMBULATORY_CARE_PROVIDER_SITE_OTHER): Payer: Medicare Other | Admitting: Family Medicine

## 2022-06-09 VITALS — BP 124/80 | HR 64 | Temp 98.2°F | Ht 65.0 in | Wt 205.4 lb

## 2022-06-09 DIAGNOSIS — I10 Essential (primary) hypertension: Secondary | ICD-10-CM

## 2022-06-09 DIAGNOSIS — I7300 Raynaud's syndrome without gangrene: Secondary | ICD-10-CM | POA: Diagnosis not present

## 2022-06-09 DIAGNOSIS — E039 Hypothyroidism, unspecified: Secondary | ICD-10-CM

## 2022-06-09 DIAGNOSIS — Z6834 Body mass index (BMI) 34.0-34.9, adult: Secondary | ICD-10-CM | POA: Diagnosis not present

## 2022-06-09 DIAGNOSIS — E6609 Other obesity due to excess calories: Secondary | ICD-10-CM

## 2022-06-09 DIAGNOSIS — K649 Unspecified hemorrhoids: Secondary | ICD-10-CM

## 2022-06-09 DIAGNOSIS — F439 Reaction to severe stress, unspecified: Secondary | ICD-10-CM

## 2022-06-09 NOTE — Assessment & Plan Note (Signed)
Chronic issue.  Continue Synthroid 88 mcg daily.

## 2022-06-09 NOTE — Assessment & Plan Note (Signed)
Chronic issue.  Discussed use of MiraLAX once daily to help with constipation to prevent the hemorrhoid from flaring up

## 2022-06-09 NOTE — Assessment & Plan Note (Signed)
Chronic issue.  Adequately controlled for her age.  She will continue amlodipine 10 mg daily and losartan 50 mg daily.

## 2022-06-09 NOTE — Assessment & Plan Note (Addendum)
Chronic issue.  Encouraged him for exercise.  Discussed monitoring her portions and maintaining a healthy diet.  Discussed reducing diet soda intake.

## 2022-06-09 NOTE — Assessment & Plan Note (Signed)
Chronic issue.  She will monitor.  She will try a smaller volume of nitroglycerin ointment in the future if she has to use that.

## 2022-06-09 NOTE — Progress Notes (Signed)
Tommi Rumps, MD Phone: [phone]  Alexandra Bishop is a 77 y.o. female who presents today for follow-up.  Hypertension: Patient is not checking her blood pressure.  She is taking amlodipine and losartan.  No chest pain, shortness of breath, or edema.  Hypothyroidism: Patient is taking Synthroid.  No skin changes.  No heat or cold intolerance.  Hemorrhoid: Patient notes about a year ago she went to the walk-in clinic and was treated for a hemorrhoid.  She notes occasionally feeling the hemorrhoid still there.  No pain.  No bleeding.  She does report issues with constipation and only has 2 bowel movements a week.  She does have to use milk of magnesia.  If she does not use that she will strain.  Raynaud's: Patient reports a history of this.  Has not bothered her in quite some time.  She notes her dermatologist is skeptical that she has this.  She notes her toes do not turn white though they do turn red and hurt at times when they get cold.  She tried nitroglycerin ointment at one point and had a significant headache so she notes she will use less nitroglycerin in the future.  Obesity: Patient notes she does physical therapy exercises and has started walking.  She is doing smoothies or eggs and bacon or an [place] muffin for breakfast.  She eats a heavier lunch.  She has yogurt and nuts at dinner.  She is measuring out the nuts.  She does drink diet soda daily.  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  She wonders about counseling for her and her husband.  Her husband follows with a psychiatrist and the patient notes that the psychiatrist does not think her husband is quite ready for counseling.  Patient wonders if I can communicate with the psychiatrist on a counselor for them.  Social History   Tobacco Use  Smoking Status Never  Smokeless Tobacco Never  Tobacco Comments   tobacco use - no     Current Outpatient Medications on File Prior to Visit  Medication Sig Dispense Refill    amLODipine (NORVASC) 10 MG tablet      aspirin EC 81 MG tablet Take 81 mg by mouth every other day.     atorvastatin (LIPITOR) 20 MG tablet TAKE 1 TABLET BY MOUTH EVERY DAY 90 tablet 3   CALCIUM-MAGNESIUM-ZINC PO Take by mouth. Twice weekly     clobetasol cream (TEMOVATE) 0.05 % Apply topically 2 (two) times daily. Arms and legs for poison ivy as needed 60 g 2   cyanocobalamin 500 MCG tablet Take 500 mcg by mouth once a week.     ELIDEL 1 % cream      fluconazole (DIFLUCAN) 150 MG tablet Take 150 mg by mouth daily.     fluocinonide (LIDEX) 0.05 % external solution Apply topically.     gabapentin (NEURONTIN) 100 MG capsule TAKE 1 CAPSULE BY MOUTH FOUR TIMES A DAY--- (INS ONLY COVERS 90 AT A TIME, MD AWARE) 90 capsule 1   ketoconazole (NIZORAL) 2 % cream Apply topically.     levothyroxine (SYNTHROID) 88 MCG tablet TAKE 1 TABLET BY MOUTH EVERY DAY 90 tablet 3   losartan (COZAAR) 50 MG tablet TAKE 1 TABLET BY MOUTH EVERY DAY 90 tablet 3   Multiple Vitamin (MULTIVITAMIN) tablet Take 1 tablet by mouth every other day.     nitroGLYCERIN (NITROGLYN) 2 % ointment Apply small amount to toes daily PRN for Raynaud's symptoms 15 g 3  nortriptyline (PAMELOR) 10 MG capsule Take by mouth.     polyethylene glycol powder (GLYCOLAX/MIRALAX) 17 GM/SCOOP powder Take 17 g by mouth daily as needed for mild constipation. 500 g 0   Potassium Gluconate 595 MG CAPS Take by mouth every other day.     SKYRIZI PEN 150 MG/ML SOAJ      No current facility-administered medications on file prior to visit.     ROS see history of present illness  Objective  Physical Exam Vitals:   06/09/22 1355  BP: 124/80  Pulse: 64  Temp: 98.2 F (36.8 C)  SpO2: 93%    BP Readings from Last 3 Encounters:  06/09/22 124/80  04/26/22 (!) 170/90  03/10/22 120/80   Wt Readings from Last 3 Encounters:  06/09/22 205 lb 6.4 oz (93.2 kg)  03/10/22 208 lb 6.4 oz (94.5 kg)  12/09/21 205 lb 3.2 oz (93.1 kg)    Physical  Exam Constitutional:      General: She is not in acute distress.    Appearance: She is not diaphoretic.  Cardiovascular:     Rate and Rhythm: Normal rate and regular rhythm.     Heart sounds: Normal heart sounds.  Pulmonary:     Effort: Pulmonary effort is normal.     Breath sounds: Normal breath sounds.  Skin:    General: Skin is warm and dry.  Neurological:     Mental Status: She is alert.      Assessment/Plan: Please see individual problem list.  Primary hypertension Assessment & Plan: Chronic issue.  Adequately controlled for her age.  She will continue amlodipine 10 mg daily and losartan 50 mg daily.   Hypothyroidism, unspecified type Assessment & Plan: Chronic issue.  Continue Synthroid 88 mcg daily.   Raynaud's disease without gangrene Assessment & Plan: Chronic issue.  She will monitor.  She will try a smaller volume of nitroglycerin ointment in the future if she has to use that.   Hemorrhoids, unspecified hemorrhoid type Assessment & Plan: Chronic issue.  Discussed use of MiraLAX once daily to help with constipation to prevent the hemorrhoid from flaring up   Class 1 obesity due to excess calories with serious comorbidity and body mass index (BMI) of 34.0 to 34.9 in adult Assessment & Plan: Chronic issue.  Encouraged him for exercise.  Discussed monitoring her portions and maintaining a healthy diet.  Discussed reducing diet soda intake.   Stress Assessment & Plan: I advised that I can send a message to the patient's husband psychiatrist to get their input on the therapist for the 2 of them.    Return in about 3 months (around 09/09/2022).   Tommi Rumps, MD [place]

## 2022-06-09 NOTE — Assessment & Plan Note (Signed)
I advised that I can send a message to the patient's husband psychiatrist to get their input on the therapist for the 2 of them.

## 2022-06-11 ENCOUNTER — Telehealth: Payer: Self-pay | Admitting: Family Medicine

## 2022-06-11 NOTE — Telephone Encounter (Signed)
-----   Message from Norman Clay, MD sent at 06/09/2022  5:22 PM EDT ----- Hi Dr. Caryl Bis,   Thanks for getting in touch. Regarding your question about a referral for couple therapy, in general, there are very few therapists who accept Medicare, and I'm not aware of anyone specifically offering couple therapy in the area. It might be beneficial for her to contact her insurance company to inquire about in-network therapists. However, as you documented, I wouldn't necessarily recommend her husband/my patient to join therapy based on the last visit. This has been discussed with her on multiple occasions. While I can't provide specifics, therapy may be more effective when there is improvement in his condition.   Let me know if you have any follow up questions. Thanks! Elie Goody ----- Message ----- From: Leone Haven, MD Sent: 06/09/2022   2:42 PM EDT To: Norman Clay, MD  Hi Dr Modesta Messing,   I hope all is well. I saw this patient today. She noted that her husband follows with you. 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 She asked me about possible counselors that would be able to see both of them together and wanted me to touch base with you on what options they might have for that. And suggestions you have are much appreciated.   Randall Hiss

## 2022-06-11 NOTE — Telephone Encounter (Signed)
Called Patient with Dr. Ellen Henri message and Patient states ok thank you for calling

## 2022-06-11 NOTE — Telephone Encounter (Signed)
Please call the patient.  Please let her know I heard back from Dr. Modesta Messing.  She noted that there are very few therapists that accept Medicare and that she was not aware of anybody locally that was providing couples therapy.  She also noted that she would not necessarily recommend therapy for the patient's husband at this time.  Dr. Modesta Messing suggested that it could be beneficial for the patient to contact her insurance company to inquire about in network therapists.

## 2022-06-29 DIAGNOSIS — D229 Melanocytic nevi, unspecified: Secondary | ICD-10-CM | POA: Diagnosis not present

## 2022-06-29 DIAGNOSIS — Z79899 Other long term (current) drug therapy: Secondary | ICD-10-CM | POA: Diagnosis not present

## 2022-06-29 DIAGNOSIS — L814 Other melanin hyperpigmentation: Secondary | ICD-10-CM | POA: Diagnosis not present

## 2022-06-29 DIAGNOSIS — L409 Psoriasis, unspecified: Secondary | ICD-10-CM | POA: Diagnosis not present

## 2022-06-29 DIAGNOSIS — L820 Inflamed seborrheic keratosis: Secondary | ICD-10-CM | POA: Diagnosis not present

## 2022-07-01 ENCOUNTER — Telehealth: Payer: Self-pay | Admitting: Family Medicine

## 2022-07-01 NOTE — Telephone Encounter (Signed)
Contacted Alexandra Bishop to schedule their annual wellness visit. Appointment made for 07/05/2022.  Thank you,  [facility] Support [facility] Direct dial  [phone]

## 2022-07-01 NOTE — Telephone Encounter (Signed)
Copied from CRM [id number]. Topic: Medicare AWV >> Jul 01, 2022  9:23 AM Rushie Goltz wrote: Reason for CRM: Called patient to schedule Medicare Annual Wellness Visit (AWV). Left message for patient to call back and schedule Medicare Annual Wellness Visit (AWV).  Last date of AWV: 07/03/2021  Please schedule an AWVS appointment at any time with [facility] VISIT.  If any questions, please contact me at [phone].    Thank you,  [facility] Support [facility] Direct dial  [phone]

## 2022-07-05 ENCOUNTER — Ambulatory Visit (INDEPENDENT_AMBULATORY_CARE_PROVIDER_SITE_OTHER): Payer: Medicare Other

## 2022-07-05 VITALS — Ht 65.0 in | Wt 205.0 lb

## 2022-07-05 DIAGNOSIS — Z0000 Encounter for general adult medical examination without abnormal findings: Secondary | ICD-10-CM | POA: Diagnosis not present

## 2022-07-05 NOTE — Patient Instructions (Addendum)
[name] , Thank you for taking time to come for your Medicare Wellness Visit. I appreciate your ongoing commitment to your health goals. Please review the following plan we discussed and let me know if I can assist you in the future.   These are the goals we discussed:  Goals       Patient Stated     Weight (lb) < 200 lb (90.7 kg) (pt-stated)      Start the DASH or Mediterranean diet      Other     Follow up with Primary Care Provider      As needed.        This is a list of the screening recommended for you and due dates:  Health Maintenance  Topic Date Due   COVID-19 Vaccine (9 - 2023-24 season) 07/21/2022*   Zoster (Shingles) Vaccine (1 of 2) 10/04/2022*   Flu Shot  10/21/2022   Medicare Annual Wellness Visit  07/05/2023   DTaP/Tdap/Td vaccine (3 - Td or Tdap) 08/10/2025   Pneumonia Vaccine  Completed   DEXA scan (bone density measurement)  Completed   Hepatitis C Screening: USPSTF Recommendation to screen - Ages 51-79 yo.  Completed   HPV Vaccine  Aged Out   Colon Cancer Screening  Discontinued  *Topic was postponed. The date shown is not the original due date.    Advanced directives: End of life planning; Advance aging; Advanced directives discussed.  Copy of current HCPOA/Living Will requested.    Conditions/risks identified: none new  Next appointment: Follow up in one year for your annual wellness visit    Preventive Care 65 Years and Older, Female Preventive care refers to lifestyle choices and visits with your health care provider that can promote health and wellness. What does preventive care include? A yearly physical exam. This is also called an annual well check. Dental exams once or twice a year. Routine eye exams. Ask your health care provider how often you should have your eyes checked. Personal lifestyle choices, including: Daily care of your teeth and gums. Regular physical activity. Eating a healthy diet. Avoiding tobacco and drug  use. Limiting alcohol use. Practicing safe sex. Taking low-dose aspirin every day. Taking vitamin and mineral supplements as recommended by your health care provider. What happens during an annual well check? The services and screenings done by your health care provider during your annual well check will depend on your age, overall health, lifestyle risk factors, and family history of disease. Counseling  Your health care provider may ask you questions about your: Alcohol use. Tobacco use. Drug use. Emotional well-being. Home and relationship well-being. Sexual activity. Eating habits. History of falls. Memory and ability to understand (cognition). Work and work Astronomer. Reproductive health. Screening  You may have the following tests or measurements: Height, weight, and BMI. Blood pressure. Lipid and cholesterol levels. These may be checked every 5 years, or more frequently if you are over 37 years old. Skin check. Lung cancer screening. You may have this screening every year starting at age 56 if you have a 30-pack-year history of smoking and currently smoke or have quit within the past 15 years. Fecal occult blood test (FOBT) of the stool. You may have this test every year starting at age 44. Flexible sigmoidoscopy or colonoscopy. You may have a sigmoidoscopy every 5 years or a colonoscopy every 10 years starting at age 27. Hepatitis C blood test. Hepatitis B blood test. Sexually transmitted disease (STD) testing. Diabetes screening. This is  done by checking your blood sugar (glucose) after you have not eaten for a while (fasting). You may have this done every 1-3 years. Bone density scan. This is done to screen for osteoporosis. You may have this done starting at age 69. Mammogram. This may be done every 1-2 years. Talk to your health care provider about how often you should have regular mammograms. Talk with your health care provider about your test results, treatment  options, and if necessary, the need for more tests. Vaccines  Your health care provider may recommend certain vaccines, such as: Influenza vaccine. This is recommended every year. Tetanus, diphtheria, and acellular pertussis (Tdap, Td) vaccine. You may need a Td booster every 10 years. Zoster vaccine. You may need this after age 73. Pneumococcal 13-valent conjugate (PCV13) vaccine. One dose is recommended after age 35. Pneumococcal polysaccharide (PPSV23) vaccine. One dose is recommended after age 49. Talk to your health care provider about which screenings and vaccines you need and how often you need them. This information is not intended to replace advice given to you by your health care provider. Make sure you discuss any questions you have with your health care provider. Document Released: 04/04/2015 Document Revised: 11/26/2015 Document Reviewed: 01/07/2015 Elsevier Interactive Patient Education  2017 [place] Prevention in the Home Falls can cause injuries. They can happen to people of all ages. There are many things you can do to make your home safe and to help prevent falls. What can I do on the outside of my home? Regularly fix the edges of walkways and driveways and fix any cracks. Remove anything that might make you trip as you walk through a door, such as a raised step or threshold. Trim any bushes or trees on the path to your home. Use bright outdoor lighting. Clear any walking paths of anything that might make someone trip, such as rocks or tools. Regularly check to see if handrails are loose or broken. Make sure that both sides of any steps have handrails. Any raised decks and porches should have guardrails on the edges. Have any leaves, snow, or ice cleared regularly. Use sand or salt on walking paths during winter. Clean up any spills in your garage right away. This includes oil or grease spills. What can I do in the bathroom? Use night lights. Install grab  bars by the toilet and in the tub and shower. Do not use towel bars as grab bars. Use non-skid mats or decals in the tub or shower. If you need to sit down in the shower, use a plastic, non-slip stool. Keep the floor dry. Clean up any water that spills on the floor as soon as it happens. Remove soap buildup in the tub or shower regularly. Attach bath mats securely with double-sided non-slip rug tape. Do not have throw rugs and other things on the floor that can make you trip. What can I do in the bedroom? Use night lights. Make sure that you have a light by your bed that is easy to reach. Do not use any sheets or blankets that are too big for your bed. They should not hang down onto the floor. Have a firm chair that has side arms. You can use this for support while you get dressed. Do not have throw rugs and other things on the floor that can make you trip. What can I do in the kitchen? Clean up any spills right away. Avoid walking on wet floors. Keep items that you  use a lot in easy-to-reach places. If you need to reach something above you, use a strong step stool that has a grab bar. Keep electrical cords out of the way. Do not use floor polish or wax that makes floors slippery. If you must use wax, use non-skid floor wax. Do not have throw rugs and other things on the floor that can make you trip. What can I do with my stairs? Do not leave any items on the stairs. Make sure that there are handrails on both sides of the stairs and use them. Fix handrails that are broken or loose. Make sure that handrails are as long as the stairways. Check any carpeting to make sure that it is firmly attached to the stairs. Fix any carpet that is loose or worn. Avoid having throw rugs at the top or bottom of the stairs. If you do have throw rugs, attach them to the floor with carpet tape. Make sure that you have a light switch at the top of the stairs and the bottom of the stairs. If you do not have them,  ask someone to add them for you. What else can I do to help prevent falls? Wear shoes that: Do not have high heels. Have rubber bottoms. Are comfortable and fit you well. Are closed at the toe. Do not wear sandals. If you use a stepladder: Make sure that it is fully opened. Do not climb a closed stepladder. Make sure that both sides of the stepladder are locked into place. Ask someone to hold it for you, if possible. Clearly mark and make sure that you can see: Any grab bars or handrails. First and last steps. Where the edge of each step is. Use tools that help you move around (mobility aids) if they are needed. These include: Canes. Walkers. Scooters. Crutches. Turn on the lights when you go into a dark area. Replace any light bulbs as soon as they burn out. Set up your furniture so you have a clear path. Avoid moving your furniture around. If any of your floors are uneven, fix them. If there are any pets around you, be aware of where they are. Review your medicines with your doctor. Some medicines can make you feel dizzy. This can increase your chance of falling. Ask your doctor what other things that you can do to help prevent falls. This information is not intended to replace advice given to you by your health care provider. Make sure you discuss any questions you have with your health care provider. Document Released: 01/02/2009 Document Revised: 08/14/2015 Document Reviewed: 04/12/2014 Elsevier Interactive Patient Education  2017 [employer].

## 2022-07-05 NOTE — Progress Notes (Signed)
Subjective:   Alexandra Bishop is a 77 y.o. female who presents for Medicare Annual (Subsequent) preventive examination.  Review of Systems    No ROS.  Medicare Wellness Virtual Visit.  Visual/audio telehealth visit, UTA vital signs.   See social history for additional risk factors.   Cardiac Risk Factors include: advanced age (>79men, >66 women)     Objective:    Today's Vitals   07/05/22 1237  Weight: 205 lb (93 kg)  Height:  (1.651 m)   Body mass index is 34.11 kg/m.     07/05/2022   12:35 PM 07/03/2021   11:28 AM 03/06/2020   10:45 AM 03/06/2019   10:57 AM 01/11/2018    1:59 PM 01/10/2017    2:40 PM 09/13/2016    1:07 PM  Advanced Directives  Does Patient Have a Medical Advance Directive? Yes No No Yes No No No  Type of Estate agent of [place];Living will   Healthcare Power of [place];Living will     Does patient want to make changes to medical advance directive? No - Patient declined   No - Patient declined Yes (MAU/Ambulatory/Procedural Areas - Information given) No - Patient declined   Copy of Healthcare Power of Attorney in Chart? No - copy requested   No - copy requested     Would patient like information on creating a medical advance directive?  No - Patient declined No - Patient declined        Current Medications (verified) Outpatient Encounter Medications as of 07/05/2022  Medication Sig   amLODipine (NORVASC) 10 MG tablet    aspirin EC 81 MG tablet Take 81 mg by mouth every other day.   atorvastatin (LIPITOR) 20 MG tablet TAKE 1 TABLET BY MOUTH EVERY DAY   CALCIUM-MAGNESIUM-ZINC PO Take by mouth. Twice weekly   clobetasol cream (TEMOVATE) 0.05 % Apply topically 2 (two) times daily. Arms and legs for poison ivy as needed   cyanocobalamin 500 MCG tablet Take 500 mcg by mouth once a week.   ELIDEL 1 % cream    fluconazole (DIFLUCAN) 150 MG tablet Take 150 mg by mouth daily.   fluocinonide (LIDEX) 0.05 % external solution Apply  topically.   gabapentin (NEURONTIN) 100 MG capsule TAKE 1 CAPSULE BY MOUTH FOUR TIMES A DAY--- (INS ONLY COVERS 90 AT A TIME, MD AWARE)   ketoconazole (NIZORAL) 2 % cream Apply topically.   levothyroxine (SYNTHROID) 88 MCG tablet TAKE 1 TABLET BY MOUTH EVERY DAY   losartan (COZAAR) 50 MG tablet TAKE 1 TABLET BY MOUTH EVERY DAY   Multiple Vitamin (MULTIVITAMIN) tablet Take 1 tablet by mouth every other day.   nitroGLYCERIN (NITROGLYN) 2 % ointment Apply small amount to toes daily PRN for Raynaud's symptoms   nortriptyline (PAMELOR) 10 MG capsule Take by mouth.   polyethylene glycol powder (GLYCOLAX/MIRALAX) 17 GM/SCOOP powder Take 17 g by mouth daily as needed for mild constipation.   Potassium Gluconate 595 MG CAPS Take by mouth every other day.   SKYRIZI PEN 150 MG/ML SOAJ    No facility-administered encounter medications on file as of 07/05/2022.    Allergies (verified) Cymbalta [duloxetine hcl], Duloxetine, Hydrocodone, Shellfish allergy, and Tramadol   History: Past Medical History:  Diagnosis Date   Chronic pain of right ankle 11/09/2017   Heel fracture 01/30/2020   Hypothyroidism    Past Surgical History:  Procedure Laterality Date   BREAST BIOPSY Right    neg   broken ankle - RT  BUNIONECTOMY  2015   Family History  Problem Relation Age of Onset   Cancer Mother        sarcoma   Heart disease Brother    Cancer Maternal Grandmother        breast   Breast cancer Maternal Grandmother    Diabetes Maternal Grandfather    Cancer Paternal Grandmother        breast   Cancer Other        family hx   Thyroid disease Other        family hx   Thyroid cancer Neg Hx    Social History   Socioeconomic History   Marital status: Married    Spouse name: Not on file   Number of children: Not on file   Years of education: Not on file   Highest education level: Not on file  Occupational History   Not on file  Tobacco Use   Smoking status: Never   Smokeless tobacco: Never    Tobacco comments:    tobacco use - no   Vaping Use   Vaping Use: Never used  Substance and Sexual Activity   Alcohol use: No   Drug use: No   Sexual activity: Never  Other Topics Concern   Not on file  Social History Narrative   From [place] originally. Followed daughter to [place] and son in [place].    Married; retired - office work; gets regular exercise - walks a mile most days. [employer]orena [employer]. Husband and her are following Paleo diet.    Social Determinants of Health   Financial Resource Strain: Low Risk  (07/05/2022)   Overall Financial Resource Strain (CARDIA)    Difficulty of Paying Living Expenses: Not hard at all  Food Insecurity: No Food Insecurity (07/05/2022)   Hunger Vital Sign    Worried About Running Out of Food in the Last Year: Never true    Ran Out of Food in the Last Year: Never true  Transportation Needs: No Transportation Needs (07/05/2022)   PRAPARE - Administrator, Civil Service (Medical): No    Lack of Transportation (Non-Medical): No  Physical Activity: Insufficiently Active (07/05/2022)   Exercise Vital Sign    Days of Exercise per Week: 7 days    Minutes of Exercise per Session: 10 min  Stress: No Stress Concern Present (07/05/2022)   [employer] of Occupational Health - Occupational Stress Questionnaire    Feeling of Stress : Not at all  Social Connections: Unknown (07/05/2022)   Social Connection and Isolation Panel [NHANES]    Frequency of Communication with Friends and Family: Not on file    Frequency of Social Gatherings with Friends and Family: Not on file    Attends Religious Services: Not on file    Active Member of Clubs or Organizations: Not on file    Attends Banker Meetings: Not on file    Marital Status: Married    Tobacco Counseling Counseling given: Not Answered Tobacco comments: tobacco use - no    Clinical Intake:  Pre-visit preparation completed: Yes        Diabetes: No  How  often do you need to have someone help you when you read instructions, pamphlets, or other written materials from your doctor or pharmacy?: 1 - Never    Interpreter Needed?: No    Activities of Daily Living    07/05/2022   12:38 PM  In your present state of health, do you have any  difficulty performing the following activities:  Hearing? 0  Vision? 0  Difficulty concentrating or making decisions? 0  Walking or climbing stairs? 0  Dressing or bathing? 0  Doing errands, shopping? 0  Preparing Food and eating ? N  Using the Toilet? N  In the past six months, have you accidently leaked urine? N  Do you have problems with loss of bowel control? N  Managing your Medications? N  Managing your Finances? Y  Comment Husband assist  Housekeeping or managing your Housekeeping? N    Patient Care Team: Glori Luis, MD as PCP - General (Family Medicine)  Indicate any recent Medical Services you may have received from other than [facility] providers in the past year (date may be approximate).     Assessment:   This is a routine wellness examination for Alexandra Bishop.  I connected with  Alexandra Bishop on 07/05/22 by a audio enabled telemedicine application and verified that I am speaking with the correct person using two identifiers.  Patient Location: Home  Provider Location: Office/Clinic  I discussed the limitations of evaluation and management by telemedicine. The patient expressed understanding and agreed to proceed.   Hearing/Vision screen Hearing Screening - Comments:: Patient is able to hear conversational tones without difficulty. No issues reported. Vision Screening - Comments:: Followed by [facility] Wears corrective lenses Visits every 6 months   Dietary issues and exercise activities discussed: Current Exercise Habits: Home exercise routine, Type of exercise: walking;stretching;strength training/weights (post physical therapy), Time (Minutes): 10, Frequency  (Times/Week): 7, Weekly Exercise (Minutes/Week): 70, Intensity: Mild   Goals Addressed               This Visit's Progress     Patient Stated     Weight (lb) < 200 lb (90.7 kg) (pt-stated)   205 lb (93 kg)     Start the DASH or Mediterranean diet       Depression Screen    07/05/2022   12:44 PM 06/09/2022    2:04 PM 03/10/2022    2:52 PM 12/09/2021    2:08 PM 09/07/2021    2:43 PM 08/19/2021    9:20 AM 07/03/2021   11:27 AM  PHQ 2/9 Scores  PHQ - 2 Score 0 0 0 1 0 0 0  PHQ- 9 Score 0 0 0        Fall Risk    07/05/2022   12:44 PM 06/09/2022    2:04 PM 12/09/2021    2:08 PM 09/07/2021    2:43 PM 08/19/2021    9:20 AM  Fall Risk   Falls in the past year? 0 0 0 1 0  Number falls in past yr: 0 0 0 1 0  Injury with Fall? 0 0 0 1 0  Risk for fall due to :  No Fall Risks Orthopedic patient History of fall(s) No Fall Risks  Follow up Falls evaluation completed;Falls prevention discussed Falls evaluation completed Falls evaluation completed Falls evaluation completed Falls evaluation completed    FALL RISK PREVENTION PERTAINING TO THE HOME: Home free of loose throw rugs in walkways, pet beds, electrical cords, etc? Yes  Adequate lighting in your home to reduce risk of falls? Yes   ASSISTIVE DEVICES UTILIZED TO PREVENT FALLS: Life alert? No  Use of a cane, walker or w/c? No  Grab bars in the bathroom? Yes  Shower chair or bench in shower? No  Elevated toilet seat or a handicapped toilet? No   TIMED UP AND GO:  Was the test performed? No .   Cognitive Function:    01/10/2017    3:03 PM 01/08/2016    3:40 PM 01/07/2015    3:33 PM  MMSE - Mini Mental State Exam  Orientation to time 5 5 5   Orientation to Place 5 5 5   Registration 3 3 3   Attention/ Calculation 5 5 5   Recall 3 3 3   Language- name 2 objects 2 2 2   Language- repeat 1 1 1   Language- follow 3 step command 3 3 3   Language- read & follow direction 1 1 1   Write a sentence 1 1 1   Copy design 1 1 1   Total  score 30 30 30         07/05/2022   12:49 PM 03/06/2020   10:56 AM 03/06/2019   10:59 AM 01/11/2018    2:00 PM  6CIT Screen  What Year? 0 points 0 points 0 points 0 points  What month? 0 points 0 points 0 points 0 points  What time? 0 points 0 points 0 points 0 points  Count back from 20 0 points 0 points 0 points 0 points  Months in reverse 0 points 0 points 0 points 0 points  Repeat phrase 0 points 4 points 0 points 0 points  Total Score 0 points 4 points 0 points 0 points    Immunizations Immunization History  Administered Date(s) Administered   Fluad Quad(high Dose 65+) 12/01/2018, 12/17/2019, 12/02/2020, 12/09/2021   Influenza Split 01/04/2011, 11/24/2011, 12/13/2013   Influenza, High Dose Seasonal PF 12/24/2016   Influenza,inj,Quad PF,6+ Mos 11/12/2015   Influenza,inj,quad, With Preservative 11/12/2015   Influenza-Unspecified 01/03/2013, 12/23/2014, 11/12/2015, 12/24/2016, 11/08/2017, 12/12/2017   Moderna Sars-Covid-2 Vaccination 09/03/2020   PFIZER Comirnaty(Gray Top)Covid-19 Tri-Sucrose Vaccine 04/09/2019, 04/30/2019, 12/21/2019   PFIZER(Purple Top)SARS-COV-2 Vaccination 04/09/2019, 04/30/2019, 12/21/2019   Pneumococcal Conjugate-13 12/03/2013, 10/18/2016   Pneumococcal Polysaccharide-23 07/29/2009, 11/08/2017   Tdap 01/12/2008, 08/11/2015   Unspecified SARS-COV-2 Vaccination 12/23/2021   Zoster, Live 11/24/2011   Covid-19 vaccine status: Completed vaccines  Shingrix Completed?: No.    Education has been provided regarding the importance of this vaccine. Patient has been advised to call insurance company to determine out of pocket expense if they have not yet received this vaccine. Advised may also receive vaccine at local pharmacy or Health Dept. Verbalized acceptance and understanding.  Screening Tests Health Maintenance  Topic Date Due   COVID-19 Vaccine (9 - 2023-24 season) 07/21/2022 (Originally 02/17/2022)   Zoster Vaccines- Shingrix (1 of 2) 10/04/2022  (Originally 08/10/1964)   INFLUENZA VACCINE  10/21/2022   Medicare Annual Wellness (AWV)  07/05/2023   DTaP/Tdap/Td (3 - Td or Tdap) 08/10/2025   Pneumonia Vaccine 63+ Years old  Completed   DEXA SCAN  Completed   Hepatitis C Screening  Completed   HPV VACCINES  Aged Out   COLONOSCOPY (Pts 45-20yrs Insurance coverage will need to be confirmed)  Discontinued    Health Maintenance There are no preventive care reminders to display for this patient.  Lung Cancer Screening: (Low Dose CT Chest recommended if Age 78-80 years, 30 pack-year currently smoking OR have quit w/in 15years.) does not qualify.   Hepatitis C Screening: Completed 10/2015.  Vision Screening: Recommended annual ophthalmology exams for early detection of glaucoma and other disorders of the eye.  Dental Screening: Recommended annual dental exams for proper oral hygiene  Community Resource Referral / Chronic Care Management: CRR required this visit?  No   CCM required this visit?  No  Plan:     I have personally reviewed and noted the following in the patient's chart:   Medical and social history Use of alcohol, tobacco or illicit drugs  Current medications and supplements including opioid prescriptions. Patient is not currently taking opioid prescriptions. Functional ability and status Nutritional status Physical activity Advanced directives List of other physicians Hospitalizations, surgeries, and ER visits in previous 12 months Vitals Screenings to include cognitive, depression, and falls Referrals and appointments  In addition, I have reviewed and discussed with patient certain preventive protocols, quality metrics, and best practice recommendations. A written personalized care plan for preventive services as well as general preventive health recommendations were provided to patient.     Cathey Endow, LPN   1/61/0960

## 2022-07-08 DIAGNOSIS — Z79899 Other long term (current) drug therapy: Secondary | ICD-10-CM | POA: Diagnosis not present

## 2022-07-08 DIAGNOSIS — L409 Psoriasis, unspecified: Secondary | ICD-10-CM | POA: Diagnosis not present

## 2022-07-12 DIAGNOSIS — N898 Other specified noninflammatory disorders of vagina: Secondary | ICD-10-CM | POA: Diagnosis not present

## 2022-07-29 ENCOUNTER — Other Ambulatory Visit: Payer: Self-pay | Admitting: Family Medicine

## 2022-07-29 DIAGNOSIS — M1990 Unspecified osteoarthritis, unspecified site: Secondary | ICD-10-CM

## 2022-08-26 DIAGNOSIS — D2262 Melanocytic nevi of left upper limb, including shoulder: Secondary | ICD-10-CM | POA: Diagnosis not present

## 2022-08-26 DIAGNOSIS — L400 Psoriasis vulgaris: Secondary | ICD-10-CM | POA: Diagnosis not present

## 2022-08-26 DIAGNOSIS — L218 Other seborrheic dermatitis: Secondary | ICD-10-CM | POA: Diagnosis not present

## 2022-08-26 DIAGNOSIS — L821 Other seborrheic keratosis: Secondary | ICD-10-CM | POA: Diagnosis not present

## 2022-08-26 DIAGNOSIS — L820 Inflamed seborrheic keratosis: Secondary | ICD-10-CM | POA: Diagnosis not present

## 2022-08-26 DIAGNOSIS — L578 Other skin changes due to chronic exposure to nonionizing radiation: Secondary | ICD-10-CM | POA: Diagnosis not present

## 2022-08-26 DIAGNOSIS — L853 Xerosis cutis: Secondary | ICD-10-CM | POA: Diagnosis not present

## 2022-08-26 DIAGNOSIS — L538 Other specified erythematous conditions: Secondary | ICD-10-CM | POA: Diagnosis not present

## 2022-09-10 ENCOUNTER — Ambulatory Visit (INDEPENDENT_AMBULATORY_CARE_PROVIDER_SITE_OTHER): Payer: Medicare Other | Admitting: Family Medicine

## 2022-09-10 ENCOUNTER — Encounter: Payer: Self-pay | Admitting: Family Medicine

## 2022-09-10 VITALS — BP 124/82 | HR 63 | Temp 97.7°F | Ht 65.0 in | Wt 200.8 lb

## 2022-09-10 DIAGNOSIS — H1132 Conjunctival hemorrhage, left eye: Secondary | ICD-10-CM

## 2022-09-10 DIAGNOSIS — L409 Psoriasis, unspecified: Secondary | ICD-10-CM | POA: Diagnosis not present

## 2022-09-10 DIAGNOSIS — I10 Essential (primary) hypertension: Secondary | ICD-10-CM | POA: Diagnosis not present

## 2022-09-10 DIAGNOSIS — Z711 Person with feared health complaint in whom no diagnosis is made: Secondary | ICD-10-CM | POA: Insufficient documentation

## 2022-09-10 DIAGNOSIS — R7303 Prediabetes: Secondary | ICD-10-CM | POA: Diagnosis not present

## 2022-09-10 DIAGNOSIS — H1130 Conjunctival hemorrhage, unspecified eye: Secondary | ICD-10-CM | POA: Insufficient documentation

## 2022-09-10 HISTORY — DX: Person with feared health complaint in whom no diagnosis is made: Z71.1

## 2022-09-10 MED ORDER — ATORVASTATIN CALCIUM 20 MG PO TABS: 20.0000 mg | ORAL_TABLET | Freq: Every day | ORAL | 3 refills | Status: DC

## 2022-09-10 NOTE — Assessment & Plan Note (Signed)
Chronic issue.  Seems to be suboptimally controlled.  I encouraged her to continue to follow with dermatology.

## 2022-09-10 NOTE — Assessment & Plan Note (Signed)
I discussed that there is not really anybody to refer her to for end-of-life planning.  Discussed trying to go through the checklist that the funeral home gave her.  I encouraged her to make sure she has her finances in a way that her funeral could be taking care of if this is feasible.  Discussed that she could also potentially put it into her living well what she would like done when she dies.

## 2022-09-10 NOTE — Assessment & Plan Note (Signed)
Chronic issue.  Adequately controlled for age.  She will continue amlodipine 10 mg daily and losartan 50 mg daily.

## 2022-09-10 NOTE — Assessment & Plan Note (Addendum)
Resolved at this time.  Patient did not have any symptoms of a corneal abrasion.  Discussed wearing protective glasses while weed eating.  She will monitor for photophobia, eye pain, and vision changes though at this point she is unlikely to have any further issues from this.

## 2022-09-10 NOTE — Progress Notes (Signed)
[name], MD Phone: [phone]  Alexandra Bishop is a 77 y.o. female who presents today for f/u.  HYPERTENSION Disease Monitoring Home BP Monitoring not checking Chest pain- no    Dyspnea- no Medications Compliance-  taking losartan, amlodipine.  Edema- no BMET    Component Value Date/Time   NA 142 03/08/2022 1027   K 4.6 03/08/2022 1027   CL 108 03/08/2022 1027   CO2 23 03/08/2022 1027   GLUCOSE 94 03/08/2022 1027   BUN 15 03/08/2022 1027   CREATININE 0.87 03/08/2022 1027   CALCIUM 9.4 03/08/2022 1027   Prediabetes: Patient notes her weight was down below 200.  She is eating more soft foods given that her husband is doing that.  She has not been able to exercise much recently.  Psoriasis: Scalp issues have not been good.  She has been seeing dermatology and is using a variety of creams.  She has been on [employer] though it does not seem to be helping much.  She follows up with dermatology in the next month or 2.  Subconjunctival hemorrhage: Patient notes while she was weed eating she ended up having some dirt and grass fly up into her face.  When she came in from outside she noted her left eye had some subconjunctival bleeding.  She noted a little bit of pain with this though that has significantly improved.  She notes no vision changes.  No photophobia.  She notes the subconjunctival hemorrhage has resolved.  Patient wanted to know if there were any resources for end-of-life planning.  She wants to make sure everything is set up and plan for in the event that she passes away.  She has been to a funeral home and gotten information from them and notes they gave her a check list of things she should do to get all of this set up.   Social History   Tobacco Use  Smoking Status Never  Smokeless Tobacco Never  Tobacco Comments   tobacco use - no     Current Outpatient Medications on File Prior to Visit  Medication Sig Dispense Refill   amLODipine (NORVASC) 10 MG tablet       aspirin EC 81 MG tablet Take 81 mg by mouth every other day.     CALCIUM-MAGNESIUM-ZINC PO Take by mouth. Twice weekly     clobetasol cream (TEMOVATE) 0.05 % Apply topically 2 (two) times daily. Arms and legs for poison ivy as needed 60 g 2   cyanocobalamin 500 MCG tablet Take 500 mcg by mouth once a week.     ELIDEL 1 % cream      fluconazole (DIFLUCAN) 150 MG tablet Take 150 mg by mouth daily.     fluocinonide (LIDEX) 0.05 % external solution Apply topically.     gabapentin (NEURONTIN) 100 MG capsule TAKE 1 CAPSULE BY MOUTH FOUR TIMES A DAY--- (INS ONLY COVERS 90 AT A TIME, MD AWARE) 90 capsule 1   ketoconazole (NIZORAL) 2 % cream Apply topically.     levothyroxine (SYNTHROID) 88 MCG tablet TAKE 1 TABLET BY MOUTH EVERY DAY 90 tablet 3   losartan (COZAAR) 50 MG tablet TAKE 1 TABLET BY MOUTH EVERY DAY 90 tablet 3   Multiple Vitamin (MULTIVITAMIN) tablet Take 1 tablet by mouth every other day.     nitroGLYCERIN (NITROGLYN) 2 % ointment Apply small amount to toes daily PRN for Raynaud's symptoms 15 g 3   nortriptyline (PAMELOR) 10 MG capsule Take by mouth.     polyethylene  glycol powder (GLYCOLAX/MIRALAX) 17 GM/SCOOP powder Take 17 g by mouth daily as needed for mild constipation. 500 g 0   Potassium Gluconate 595 MG CAPS Take by mouth every other day.     SKYRIZI PEN 150 MG/ML SOAJ      No current facility-administered medications on file prior to visit.     ROS see history of present illness  Objective  Physical Exam Vitals:   09/10/22 1349  BP: 124/82  Pulse: 63  Temp: 97.7 F (36.5 C)  SpO2: 95%    BP Readings from Last 3 Encounters:  09/10/22 124/82  06/09/22 124/80  04/26/22 (!) 170/90   Wt Readings from Last 3 Encounters:  09/10/22 200 lb 12.8 oz (91.1 kg)  07/05/22 205 lb (93 kg)  06/09/22 205 lb 6.4 oz (93.2 kg)    Physical Exam Constitutional:      General: She is not in acute distress.    Appearance: She is not diaphoretic.  Eyes:     General:         Right eye: No discharge.        Left eye: No discharge.     Conjunctiva/sclera: Conjunctivae normal.     Pupils: Pupils are equal, round, and reactive to light.     Comments: No evidence of any foreign bodies under eyelids bilaterally, grossly there is no evidence of any structural damage to either eye  Cardiovascular:     Rate and Rhythm: Normal rate and regular rhythm.     Heart sounds: Normal heart sounds.  Pulmonary:     Effort: Pulmonary effort is normal.     Breath sounds: Normal breath sounds.  Skin:    General: Skin is warm and dry.  Neurological:     Mental Status: She is alert.      Assessment/Plan: Please see individual problem list.  Primary hypertension Assessment & Plan: Chronic issue.  Adequately controlled for age.  She will continue amlodipine 10 mg daily and losartan 50 mg daily.  Orders: -     Basic metabolic panel  Psoriasis Assessment & Plan: Chronic issue.  Seems to be suboptimally controlled.  I encouraged her to continue to follow with dermatology.   Prediabetes Assessment & Plan: Chronic issue.  Check A1c.  Encouraged healthy diet and exercise.  Orders: -     Hemoglobin A1c  Subconjunctival hemorrhage of left eye Assessment & Plan: Resolved at this time.  Patient did not have any symptoms of a corneal abrasion.  Discussed wearing protective glasses while weed eating.  She will monitor for photophobia, eye pain, and vision changes though at this point she is unlikely to have any further issues from this.   Concern about end of life Assessment & Plan: I discussed that there is not really anybody to refer her to for end-of-life planning.  Discussed trying to go through the checklist that the funeral home gave her.  I encouraged her to make sure she has her finances in a way that her funeral could be taking care of if this is feasible.  Discussed that she could also potentially put it into her living well what she would like done when she  dies.   Other orders -     Atorvastatin Calcium; Take 1 tablet (20 mg total) by mouth daily.  Dispense: 90 tablet; Refill: 3    Return in about 3 months (around 12/11/2022).   [name], MD [facility]

## 2022-09-10 NOTE — Assessment & Plan Note (Signed)
Chronic issue.  Check A1c.  Encouraged healthy diet and exercise. 

## 2022-09-11 LAB — HEMOGLOBIN A1C
Hgb A1c MFr Bld: 5.8 % of total Hgb — ABNORMAL HIGH (ref <5.7)
Mean Plasma Glucose: 120 mg/dL
eAG (mmol/L): 6.6 mmol/L

## 2022-09-11 LAB — BASIC METABOLIC PANEL
BUN: 18 mg/dL (ref 7–25)
CO2: 24 mmol/L (ref 20–32)
Calcium: 10.4 mg/dL (ref 8.6–10.4)
Chloride: 105 mmol/L (ref 98–110)
Creat: 0.94 mg/dL (ref 0.60–1.00)
Glucose, Bld: 89 mg/dL (ref 65–99)
Potassium: 4.7 mmol/L (ref 3.5–5.3)
Sodium: 140 mmol/L (ref 135–146)

## 2022-10-04 ENCOUNTER — Other Ambulatory Visit: Payer: Self-pay

## 2022-10-04 ENCOUNTER — Telehealth: Payer: Self-pay | Admitting: Family Medicine

## 2022-10-04 DIAGNOSIS — M1990 Unspecified osteoarthritis, unspecified site: Secondary | ICD-10-CM

## 2022-10-04 MED ORDER — GABAPENTIN 100 MG PO CAPS: ORAL_CAPSULE | ORAL | 1 refills | Status: DC

## 2022-10-04 NOTE — Telephone Encounter (Signed)
 Prescription sent to [facility]

## 2022-10-04 NOTE — Telephone Encounter (Signed)
Prescription Request  10/04/2022  LOV: 09/10/2022  What is the name of the medication or equipment? gabapentin   Have you contacted your pharmacy to request a refill? Yes   [facility]   Patient notified that their request is being sent to the clinical staff for review and that they should receive a response within 2 business days.   Please advise at [facility] [phone]

## 2022-10-11 ENCOUNTER — Telehealth: Payer: Self-pay | Admitting: Family Medicine

## 2022-10-11 DIAGNOSIS — G8929 Other chronic pain: Secondary | ICD-10-CM

## 2022-10-11 NOTE — Telephone Encounter (Signed)
Left message to call the office back to see what is the diagnosis she is requesting Physical Therapy for?

## 2022-10-11 NOTE — Telephone Encounter (Signed)
Is there a specific diagnosis she is requesting this for? I have to have a diagnosis to tie to the referral.

## 2022-10-11 NOTE — Telephone Encounter (Signed)
Pt called stating she would like a referral to physical therapy so she could be more fit

## 2022-10-12 DIAGNOSIS — I7300 Raynaud's syndrome without gangrene: Secondary | ICD-10-CM | POA: Diagnosis not present

## 2022-10-12 DIAGNOSIS — Z79899 Other long term (current) drug therapy: Secondary | ICD-10-CM | POA: Diagnosis not present

## 2022-10-12 DIAGNOSIS — L409 Psoriasis, unspecified: Secondary | ICD-10-CM | POA: Diagnosis not present

## 2022-10-12 NOTE — Addendum Note (Signed)
Addended by: Birdie Sons, [place] Lust G on: 10/12/2022 11:30 AM   Modules accepted: Orders

## 2022-10-12 NOTE — Telephone Encounter (Signed)
Called Patient to let her know the referral has been placed.

## 2022-10-12 NOTE — Telephone Encounter (Signed)
Referral placed.

## 2022-10-12 NOTE — Telephone Encounter (Addendum)
Pt called back and I read the message to her and she stated that this was a one year check up to see how she was doing and to add more exercises

## 2022-10-12 NOTE — Telephone Encounter (Signed)
Called Patient back to find out and she states she has the same problems as always chronic hip, neck and back pain and would like to go to [facility] for the pain.

## 2022-10-28 ENCOUNTER — Other Ambulatory Visit: Payer: Self-pay | Admitting: Family Medicine

## 2022-10-28 DIAGNOSIS — I10 Essential (primary) hypertension: Secondary | ICD-10-CM

## 2022-11-04 ENCOUNTER — Encounter (INDEPENDENT_AMBULATORY_CARE_PROVIDER_SITE_OTHER): Payer: Self-pay

## 2022-11-10 DIAGNOSIS — L400 Psoriasis vulgaris: Secondary | ICD-10-CM | POA: Diagnosis not present

## 2022-11-10 DIAGNOSIS — L821 Other seborrheic keratosis: Secondary | ICD-10-CM | POA: Diagnosis not present

## 2022-11-12 DIAGNOSIS — M545 Low back pain, unspecified: Secondary | ICD-10-CM | POA: Diagnosis not present

## 2022-11-25 ENCOUNTER — Other Ambulatory Visit: Payer: Self-pay | Admitting: Family Medicine

## 2022-11-25 DIAGNOSIS — M1990 Unspecified osteoarthritis, unspecified site: Secondary | ICD-10-CM

## 2022-12-09 DIAGNOSIS — Z23 Encounter for immunization: Secondary | ICD-10-CM | POA: Diagnosis not present

## 2022-12-09 DIAGNOSIS — M545 Low back pain, unspecified: Secondary | ICD-10-CM | POA: Diagnosis not present

## 2022-12-13 ENCOUNTER — Ambulatory Visit (INDEPENDENT_AMBULATORY_CARE_PROVIDER_SITE_OTHER): Payer: Medicare Other | Admitting: Family Medicine

## 2022-12-13 ENCOUNTER — Encounter: Payer: Self-pay | Admitting: Family Medicine

## 2022-12-13 VITALS — BP 126/82 | HR 65 | Temp 97.9°F | Ht 65.0 in | Wt 197.0 lb

## 2022-12-13 DIAGNOSIS — Z6832 Body mass index (BMI) 32.0-32.9, adult: Secondary | ICD-10-CM | POA: Diagnosis not present

## 2022-12-13 DIAGNOSIS — E039 Hypothyroidism, unspecified: Secondary | ICD-10-CM

## 2022-12-13 DIAGNOSIS — L409 Psoriasis, unspecified: Secondary | ICD-10-CM | POA: Diagnosis not present

## 2022-12-13 DIAGNOSIS — Z23 Encounter for immunization: Secondary | ICD-10-CM | POA: Diagnosis not present

## 2022-12-13 DIAGNOSIS — E6609 Other obesity due to excess calories: Secondary | ICD-10-CM | POA: Diagnosis not present

## 2022-12-13 NOTE — Assessment & Plan Note (Signed)
Chronic issue.  Seems to be suboptimally controlled.  She will continue to follow with dermatology and rheumatology.

## 2022-12-13 NOTE — Assessment & Plan Note (Signed)
Chronic issue.  Continue Synthroid 88 mcg daily.  Check TSH at next visit.

## 2022-12-13 NOTE — Assessment & Plan Note (Signed)
Chronic issue.  Encouraged continued exercise.  She will monitor portions and maintain a healthy diet.  Discussed reducing sugary drink intake.  Encouraged to monitor the portion sizes of knots that she is eating.

## 2022-12-13 NOTE — Progress Notes (Signed)
[name], MD Phone: [phone]  Alexandra Bishop is a 77 y.o. female who presents today for follow-up.  Hypothyroidism: Taking Synthroid.  No skin changes.  No heat or cold intolerance.  Obesity: Patient notes she tries to resist sweets.  She typically has a smoothie with fruit, peanut butter, and milk for breakfast.  She will have a variety of foods at lunch and that is her bigger meal.  She will also have yogurt and not mixed urine for dinner.  No snacks.  She has a low sugar drink once daily.  Psoriasis: Patient follows up with rheumatology in the near future.  Notes the methotrexate has not been helpful.  Social History   Tobacco Use  Smoking Status Never  Smokeless Tobacco Never  Tobacco Comments   tobacco use - no     Current Outpatient Medications on File Prior to Visit  Medication Sig Dispense Refill   amLODipine (NORVASC) 10 MG tablet      aspirin EC 81 MG tablet Take 81 mg by mouth every other day.     atorvastatin (LIPITOR) 20 MG tablet Take 1 tablet (20 mg total) by mouth daily. 90 tablet 3   CALCIUM-MAGNESIUM-ZINC PO Take by mouth. Twice weekly     clobetasol cream (TEMOVATE) 0.05 % Apply topically 2 (two) times daily. Arms and legs for poison ivy as needed 60 g 2   cyanocobalamin 500 MCG tablet Take 500 mcg by mouth once a week.     ELIDEL 1 % cream      fluconazole (DIFLUCAN) 150 MG tablet Take 150 mg by mouth daily.     fluocinonide (LIDEX) 0.05 % external solution Apply topically.     gabapentin (NEURONTIN) 100 MG capsule TAKE 1 CAPSULE BY MOUTH FOUR TIMES A DAY--- (INS ONLY COVERS 90 AT A TIME, MD AWARE) 90 capsule 1   ketoconazole (NIZORAL) 2 % cream Apply topically.     levothyroxine (SYNTHROID) 88 MCG tablet TAKE 1 TABLET BY MOUTH EVERY DAY 90 tablet 3   losartan (COZAAR) 50 MG tablet TAKE 1 TABLET BY MOUTH EVERY DAY 90 tablet 3   Multiple Vitamin (MULTIVITAMIN) tablet Take 1 tablet by mouth every other day.     nitroGLYCERIN (NITROGLYN) 2 % ointment  Apply small amount to toes daily PRN for Raynaud's symptoms 15 g 3   nortriptyline (PAMELOR) 10 MG capsule Take by mouth.     polyethylene glycol powder (GLYCOLAX/MIRALAX) 17 GM/SCOOP powder Take 17 g by mouth daily as needed for mild constipation. 500 g 0   Potassium Gluconate 595 MG CAPS Take by mouth every other day.     SKYRIZI PEN 150 MG/ML SOAJ      No current facility-administered medications on file prior to visit.     ROS see history of present illness  Objective  Physical Exam Vitals:   12/13/22 1347  BP: 126/82  Pulse: 65  Temp: 97.9 F (36.6 C)  SpO2: 97%    BP Readings from Last 3 Encounters:  12/13/22 126/82  09/10/22 124/82  06/09/22 124/80   Wt Readings from Last 3 Encounters:  12/13/22 197 lb (89.4 kg)  09/10/22 200 lb 12.8 oz (91.1 kg)  07/05/22 205 lb (93 kg)    Physical Exam Constitutional:      General: She is not in acute distress.    Appearance: She is not diaphoretic.  Cardiovascular:     Rate and Rhythm: Normal rate and regular rhythm.     Heart sounds: Normal heart sounds.  Pulmonary:     Effort: Pulmonary effort is normal.     Breath sounds: Normal breath sounds.  Skin:    General: Skin is warm and dry.  Neurological:     Mental Status: She is alert.      Assessment/Plan: Please see individual problem list.  Hypothyroidism, unspecified type Assessment & Plan: Chronic issue.  Continue Synthroid 88 mcg daily.  Check TSH at next visit.   Psoriasis Assessment & Plan: Chronic issue.  Seems to be suboptimally controlled.  She will continue to follow with dermatology and rheumatology.   Class 1 obesity due to excess calories with serious comorbidity and body mass index (BMI) of 32.0 to 32.9 in adult Assessment & Plan: Chronic issue.  Encouraged continued exercise.  She will monitor portions and maintain a healthy diet.  Discussed reducing sugary drink intake.  Encouraged to monitor the portion sizes of knots that she is  eating.   Encounter for administration of vaccine -     FLU VACCINE MDCK QUAD W/Preservative    Return in about 3 months (around 03/14/2023).   [name], MD [facility]

## 2022-12-15 DIAGNOSIS — I7300 Raynaud's syndrome without gangrene: Secondary | ICD-10-CM | POA: Diagnosis not present

## 2022-12-15 DIAGNOSIS — L219 Seborrheic dermatitis, unspecified: Secondary | ICD-10-CM | POA: Diagnosis not present

## 2022-12-15 DIAGNOSIS — Z7960 Long term (current) use of unspecified immunomodulators and immunosuppressants: Secondary | ICD-10-CM | POA: Diagnosis not present

## 2022-12-15 DIAGNOSIS — L409 Psoriasis, unspecified: Secondary | ICD-10-CM | POA: Diagnosis not present

## 2022-12-20 NOTE — Addendum Note (Signed)
Addended by: Warden Fillers on: 12/20/2022 09:07 AM   Modules accepted: Orders

## 2023-01-21 DIAGNOSIS — M79673 Pain in unspecified foot: Secondary | ICD-10-CM

## 2023-01-21 NOTE — Progress Notes (Signed)
Inserts sent out for re-furbish patient paid can pu in [place] when in  [place]

## 2023-01-27 ENCOUNTER — Other Ambulatory Visit: Payer: Self-pay | Admitting: Family Medicine

## 2023-01-27 DIAGNOSIS — M1990 Unspecified osteoarthritis, unspecified site: Secondary | ICD-10-CM

## 2023-02-10 DIAGNOSIS — L400 Psoriasis vulgaris: Secondary | ICD-10-CM | POA: Diagnosis not present

## 2023-02-10 DIAGNOSIS — D692 Other nonthrombocytopenic purpura: Secondary | ICD-10-CM | POA: Diagnosis not present

## 2023-02-21 DIAGNOSIS — Z7960 Long term (current) use of unspecified immunomodulators and immunosuppressants: Secondary | ICD-10-CM | POA: Diagnosis not present

## 2023-02-21 DIAGNOSIS — L409 Psoriasis, unspecified: Secondary | ICD-10-CM | POA: Diagnosis not present

## 2023-02-21 DIAGNOSIS — I7300 Raynaud's syndrome without gangrene: Secondary | ICD-10-CM | POA: Diagnosis not present

## 2023-03-04 ENCOUNTER — Telehealth: Payer: Self-pay | Admitting: Podiatry

## 2023-03-04 NOTE — Telephone Encounter (Signed)
Pt called to see if her refurbished orthotics are in [place] office.  I asked if she had gotten a call stating they were in and she said no. I checked chart and did not see any notes. I told pt I would ask that department and give her a call back when they let me know.   They are refurbished and she said she is due at the [place] office at 1000am. I do not see an appt but told her if they were not there someone should have contacted her. Please call asap pt if they are not there

## 2023-03-07 ENCOUNTER — Ambulatory Visit (INDEPENDENT_AMBULATORY_CARE_PROVIDER_SITE_OTHER): Payer: Medicare Other | Admitting: Family Medicine

## 2023-03-07 ENCOUNTER — Encounter: Payer: Self-pay | Admitting: Family Medicine

## 2023-03-07 VITALS — BP 122/78 | HR 61 | Temp 97.5°F | Ht 65.0 in | Wt 194.8 lb

## 2023-03-07 DIAGNOSIS — E785 Hyperlipidemia, unspecified: Secondary | ICD-10-CM

## 2023-03-07 DIAGNOSIS — E039 Hypothyroidism, unspecified: Secondary | ICD-10-CM

## 2023-03-07 DIAGNOSIS — H269 Unspecified cataract: Secondary | ICD-10-CM | POA: Insufficient documentation

## 2023-03-07 DIAGNOSIS — H259 Unspecified age-related cataract: Secondary | ICD-10-CM | POA: Diagnosis not present

## 2023-03-07 DIAGNOSIS — F439 Reaction to severe stress, unspecified: Secondary | ICD-10-CM

## 2023-03-07 DIAGNOSIS — I10 Essential (primary) hypertension: Secondary | ICD-10-CM

## 2023-03-07 DIAGNOSIS — L409 Psoriasis, unspecified: Secondary | ICD-10-CM | POA: Diagnosis not present

## 2023-03-07 DIAGNOSIS — R7303 Prediabetes: Secondary | ICD-10-CM

## 2023-03-07 LAB — COMPREHENSIVE METABOLIC PANEL WITH GFR
ALT: 21 U/L (ref 0–35)
AST: 25 U/L (ref 0–37)
Albumin: 4.3 g/dL (ref 3.5–5.2)
Alkaline Phosphatase: 84 U/L (ref 39–117)
BUN: 14 mg/dL (ref 6–23)
CO2: 27 meq/L (ref 19–32)
Calcium: 9.7 mg/dL (ref 8.4–10.5)
Chloride: 104 meq/L (ref 96–112)
Creatinine, Ser: 0.83 mg/dL (ref 0.40–1.20)
GFR: 66.98 mL/min
Glucose, Bld: 91 mg/dL (ref 70–99)
Potassium: 4.2 meq/L (ref 3.5–5.1)
Sodium: 141 meq/L (ref 135–145)
Total Bilirubin: 1.2 mg/dL (ref 0.2–1.2)
Total Protein: 6.3 g/dL (ref 6.0–8.3)

## 2023-03-07 LAB — LIPID PANEL
Cholesterol: 116 mg/dL (ref 0–200)
HDL: 51.1 mg/dL
LDL Cholesterol: 27 mg/dL (ref 0–99)
NonHDL: 64.91
Total CHOL/HDL Ratio: 2
Triglycerides: 189 mg/dL — ABNORMAL HIGH (ref 0.0–149.0)
VLDL: 37.8 mg/dL (ref 0.0–40.0)

## 2023-03-07 LAB — HEMOGLOBIN A1C: Hgb A1c MFr Bld: 5.7 % (ref 4.6–6.5)

## 2023-03-07 LAB — TSH: TSH: 1.83 u[IU]/mL (ref 0.35–5.50)

## 2023-03-07 NOTE — Assessment & Plan Note (Signed)
Chronic issue.  Continue Synthroid 88 mcg daily.  Check TSH.

## 2023-03-07 NOTE — Addendum Note (Signed)
Addended by: Prince Solian A on: 03/07/2023 11:51 AM   Modules accepted: Orders

## 2023-03-07 NOTE — Assessment & Plan Note (Signed)
Patient will see her ophthalmologist as planned.

## 2023-03-07 NOTE — Assessment & Plan Note (Signed)
Chronic issue.  She will continue to see dermatology and rheumatology for this.

## 2023-03-07 NOTE — Progress Notes (Signed)
Alexandra Alar, MD Phone: [phone]  Alexandra Bishop is a 77 y.o. female who presents today for f/u.  HYPERTENSION Disease Monitoring Home BP Monitoring not checking Chest pain- no    Dyspnea- no Medications Compliance-  taking amlodipine, losartan.  Edema- no BMET    Component Value Date/Time   NA 140 09/10/2022 1438   K 4.7 09/10/2022 1438   CL 105 09/10/2022 1438   CO2 24 09/10/2022 1438   GLUCOSE 89 09/10/2022 1438   BUN 18 09/10/2022 1438   CREATININE 0.94 09/10/2022 1438   CALCIUM 10.4 09/10/2022 1438   HYPERLIPIDEMIA Symptoms Chest pain on exertion:  no    Medications: Compliance- taking lipitor Right upper quadrant pain- no  Muscle aches- no Lipid Panel     Component Value Date/Time   CHOL 130 03/10/2022 1538   TRIG 188.0 (H) 03/10/2022 1538   HDL 55.10 03/10/2022 1538   CHOLHDL 2 03/10/2022 1538   VLDL 37.6 03/10/2022 1538   LDLCALC 37 03/10/2022 1538   LDLDIRECT 51.0 11/01/2018 0843   Scalp psoriasis: Patient continues to see dermatology and rheumatology for this.  She is now on methotrexate.  Cataracts: Patient reports she is seeing her eye doctor in February and will likely be having surgery shortly after.  Patient continues to deal with stressors at home.  Her husband continues to have some depression.  Patient notes no depression in herself though she is tired.  Social History   Tobacco Use  Smoking Status Never  Smokeless Tobacco Never  Tobacco Comments   tobacco use - no     Current Outpatient Medications on File Prior to Visit  Medication Sig Dispense Refill   amLODipine (NORVASC) 10 MG tablet      aspirin EC 81 MG tablet Take 81 mg by mouth every other day.     atorvastatin (LIPITOR) 20 MG tablet Take 1 tablet (20 mg total) by mouth daily. 90 tablet 3   CALCIUM-MAGNESIUM-ZINC PO Take by mouth. Twice weekly     clobetasol cream (TEMOVATE) 0.05 % Apply topically 2 (two) times daily. Arms and legs for poison ivy as needed 60 g 2    cyanocobalamin 500 MCG tablet Take 500 mcg by mouth once a week.     ELIDEL 1 % cream      fluconazole (DIFLUCAN) 150 MG tablet Take 150 mg by mouth daily.     fluocinonide (LIDEX) 0.05 % external solution Apply topically.     gabapentin (NEURONTIN) 100 MG capsule TAKE 1 CAPSULE BY MOUTH FOUR TIMES A DAY--- (INS ONLY COVERS 90 AT A TIME, MD AWARE) 90 capsule 1   ketoconazole (NIZORAL) 2 % cream Apply topically.     levothyroxine (SYNTHROID) 88 MCG tablet TAKE 1 TABLET BY MOUTH EVERY DAY 90 tablet 3   losartan (COZAAR) 50 MG tablet TAKE 1 TABLET BY MOUTH EVERY DAY 90 tablet 3   Multiple Vitamin (MULTIVITAMIN) tablet Take 1 tablet by mouth every other day.     nitroGLYCERIN (NITROGLYN) 2 % ointment Apply small amount to toes daily PRN for Raynaud's symptoms 15 g 3   nortriptyline (PAMELOR) 10 MG capsule Take by mouth.     polyethylene glycol powder (GLYCOLAX/MIRALAX) 17 GM/SCOOP powder Take 17 g by mouth daily as needed for mild constipation. 500 g 0   Potassium Gluconate 595 MG CAPS Take by mouth every other day.     SKYRIZI PEN 150 MG/ML SOAJ      No current facility-administered medications on file prior to visit.  ROS see history of present illness  Objective  Physical Exam Vitals:   03/07/23 1102  BP: 122/78  Pulse: 61  Temp: (!) 97.5 F (36.4 C)  SpO2: 97%    BP Readings from Last 3 Encounters:  03/07/23 122/78  12/13/22 126/82  09/10/22 124/82   Wt Readings from Last 3 Encounters:  03/07/23 194 lb 12.8 oz (88.4 kg)  12/13/22 197 lb (89.4 kg)  09/10/22 200 lb 12.8 oz (91.1 kg)    Physical Exam Constitutional:      General: She is not in acute distress.    Appearance: She is not diaphoretic.  Cardiovascular:     Rate and Rhythm: Normal rate and regular rhythm.     Heart sounds: Normal heart sounds.  Pulmonary:     Effort: Pulmonary effort is normal.     Breath sounds: Normal breath sounds.  Skin:    General: Skin is warm and dry.  Neurological:      Mental Status: She is alert.      Assessment/Plan: Please see individual problem list.  Primary hypertension Assessment & Plan: Chronic issue.  Adequately controlled.  Patient will continue amlodipine 10 mg daily and losartan 50 mg daily.  Orders: -     Comprehensive metabolic panel  Hypothyroidism, unspecified type Assessment & Plan: Chronic issue.  Continue Synthroid 88 mcg daily.  Check TSH.  Orders: -     TSH  Psoriasis Assessment & Plan: Chronic issue.  She will continue to see dermatology and rheumatology for this.   Hyperlipidemia, unspecified hyperlipidemia type Assessment & Plan: Chronic issue.  Continue Lipitor 20 mg daily.  Check lipid panel.  Orders: -     Lipid panel  Prediabetes Assessment & Plan: Chronic issue.  Check A1c.  Orders: -     Hemoglobin A1c  Stress Assessment & Plan: Continue to monitor stress levels.  Discussed trying to do something for herself to help with her stress.   Senile cataract, unspecified age-related cataract type, unspecified laterality Assessment & Plan: Patient will see her ophthalmologist as planned.      Return in about 3 months (around 06/05/2023) for transfer to [facility].   Alexandra Alar, MD [facility]

## 2023-03-07 NOTE — Assessment & Plan Note (Signed)
Continue to monitor stress levels.  Discussed trying to do something for herself to help with her stress.

## 2023-03-07 NOTE — Assessment & Plan Note (Signed)
Chronic issue.  Adequately controlled.  Patient will continue amlodipine 10 mg daily and losartan 50 mg daily.

## 2023-03-07 NOTE — Addendum Note (Signed)
Addended by: Prince Solian A on: 03/07/2023 11:47 AM   Modules accepted: Orders

## 2023-03-07 NOTE — Assessment & Plan Note (Signed)
Chronic issue.  Continue Lipitor 20 mg daily.  Check lipid panel.

## 2023-03-07 NOTE — Assessment & Plan Note (Signed)
Chronic issue.  Check A1c. 

## 2023-03-08 ENCOUNTER — Telehealth: Payer: Self-pay | Admitting: Family Medicine

## 2023-03-08 NOTE — Telephone Encounter (Signed)
Spoke to pt read [employer] pt verbalized understanding

## 2023-03-08 NOTE — Telephone Encounter (Signed)
Patient called and said she saw her lab results in her MyChart. She has questions and is asking for someone to call her back.

## 2023-03-24 ENCOUNTER — Other Ambulatory Visit: Payer: Self-pay | Admitting: Family Medicine

## 2023-03-24 DIAGNOSIS — M1990 Unspecified osteoarthritis, unspecified site: Secondary | ICD-10-CM

## 2023-03-30 ENCOUNTER — Other Ambulatory Visit: Payer: Self-pay | Admitting: Family Medicine

## 2023-03-30 DIAGNOSIS — Z1231 Encounter for screening mammogram for malignant neoplasm of breast: Secondary | ICD-10-CM

## 2023-04-04 DIAGNOSIS — Z1231 Encounter for screening mammogram for malignant neoplasm of breast: Secondary | ICD-10-CM | POA: Diagnosis not present

## 2023-04-04 DIAGNOSIS — Z01419 Encounter for gynecological examination (general) (routine) without abnormal findings: Secondary | ICD-10-CM | POA: Diagnosis not present

## 2023-04-04 DIAGNOSIS — Z1331 Encounter for screening for depression: Secondary | ICD-10-CM | POA: Diagnosis not present

## 2023-04-15 ENCOUNTER — Telehealth: Payer: Self-pay

## 2023-04-15 NOTE — Telephone Encounter (Signed)
Copied from CRM [id number]. Topic: Clinical - Prescription Issue >> Apr 15, 2023 11:02 AM Prudencio Pair wrote: Reason for CRM: Patient called stating that she mailed over something in regards to her levothyroxin. She wanting to know if it was received? States apparently [facility] wasn't too happy about the levothyroxin & there is an issue. Please give pt a call back. CB #: [phone].

## 2023-04-19 NOTE — Telephone Encounter (Signed)
Called pt and when I ask her birthday she ask why we was doing all this. I did advise that I was verifying her, and I advise her that I was returning a call. She stated that what she called about how been resolved.

## 2023-04-25 DIAGNOSIS — E119 Type 2 diabetes mellitus without complications: Secondary | ICD-10-CM | POA: Diagnosis not present

## 2023-04-25 DIAGNOSIS — H353131 Nonexudative age-related macular degeneration, bilateral, early dry stage: Secondary | ICD-10-CM | POA: Diagnosis not present

## 2023-04-25 DIAGNOSIS — H2513 Age-related nuclear cataract, bilateral: Secondary | ICD-10-CM | POA: Diagnosis not present

## 2023-04-25 DIAGNOSIS — H43813 Vitreous degeneration, bilateral: Secondary | ICD-10-CM | POA: Diagnosis not present

## 2023-04-27 ENCOUNTER — Encounter: Payer: Self-pay | Admitting: Podiatry

## 2023-04-27 ENCOUNTER — Telehealth: Payer: Self-pay | Admitting: Family Medicine

## 2023-04-27 ENCOUNTER — Ambulatory Visit (INDEPENDENT_AMBULATORY_CARE_PROVIDER_SITE_OTHER): Payer: Medicare Other | Admitting: Podiatry

## 2023-04-27 DIAGNOSIS — M2042 Other hammer toe(s) (acquired), left foot: Secondary | ICD-10-CM | POA: Diagnosis not present

## 2023-04-27 DIAGNOSIS — M2041 Other hammer toe(s) (acquired), right foot: Secondary | ICD-10-CM

## 2023-04-27 DIAGNOSIS — I7300 Raynaud's syndrome without gangrene: Secondary | ICD-10-CM | POA: Diagnosis not present

## 2023-04-27 NOTE — Progress Notes (Signed)
 She presents today states that she thinks her Raynaud's is back.  She is seeing Dr. Silva who prescribed nitroglycerin  cream to her previously and she states that is still in the refrigerator.  Objective: Vital signs are stable alert oriented x 3 pulses are palpable.  She does have what appears to be Raynaud's and that her toes are red-white and blue.  This does appear to be vasospastic and that some toes are different colors.  Her fingers do not present in this manner.  Assessment: Probable Raynaud's.  Plan: Follow-up with her on as-needed basis.

## 2023-04-27 NOTE — Telephone Encounter (Signed)
 Please let the patient know that I got a fax from [facility] that her levothyroxine  was part of a recent recall.  She needs to contact her pharmacy to get a replacement supply.

## 2023-04-28 NOTE — Telephone Encounter (Signed)
 Lvm for pt. Okay to relay message, if message is relayed please notify office. Thx

## 2023-04-29 NOTE — Telephone Encounter (Signed)
 Pt has spoken with pharmacy and "everything is fine" per pt.

## 2023-05-16 ENCOUNTER — Ambulatory Visit
Admission: RE | Admit: 2023-05-16 | Discharge: 2023-05-16 | Disposition: A | Discharge status: Home / Self Care | Payer: Medicare Other | Source: Ambulatory Visit | Attending: Family Medicine | Admitting: Family Medicine

## 2023-05-16 DIAGNOSIS — Z1231 Encounter for screening mammogram for malignant neoplasm of breast: Secondary | ICD-10-CM | POA: Insufficient documentation

## 2023-05-17 ENCOUNTER — Other Ambulatory Visit: Payer: Self-pay | Admitting: Family Medicine

## 2023-05-17 DIAGNOSIS — E039 Hypothyroidism, unspecified: Secondary | ICD-10-CM

## 2023-05-17 DIAGNOSIS — M1990 Unspecified osteoarthritis, unspecified site: Secondary | ICD-10-CM

## 2023-05-19 DIAGNOSIS — L853 Xerosis cutis: Secondary | ICD-10-CM | POA: Diagnosis not present

## 2023-05-19 DIAGNOSIS — L538 Other specified erythematous conditions: Secondary | ICD-10-CM | POA: Diagnosis not present

## 2023-05-19 DIAGNOSIS — L400 Psoriasis vulgaris: Secondary | ICD-10-CM | POA: Diagnosis not present

## 2023-05-19 DIAGNOSIS — L820 Inflamed seborrheic keratosis: Secondary | ICD-10-CM | POA: Diagnosis not present

## 2023-06-09 ENCOUNTER — Encounter: Payer: Medicare Other | Admitting: Family Medicine

## 2023-06-23 DIAGNOSIS — Z7960 Long term (current) use of unspecified immunomodulators and immunosuppressants: Secondary | ICD-10-CM | POA: Diagnosis not present

## 2023-06-23 DIAGNOSIS — I7300 Raynaud's syndrome without gangrene: Secondary | ICD-10-CM | POA: Diagnosis not present

## 2023-06-23 DIAGNOSIS — L409 Psoriasis, unspecified: Secondary | ICD-10-CM | POA: Diagnosis not present

## 2023-06-30 ENCOUNTER — Other Ambulatory Visit: Payer: Self-pay

## 2023-06-30 MED ORDER — ATORVASTATIN CALCIUM 20 MG PO TABS: 20.0000 mg | ORAL_TABLET | Freq: Every day | ORAL | 0 refills | Status: DC

## 2023-07-06 ENCOUNTER — Ambulatory Visit: Payer: Medicare Other

## 2023-07-06 VITALS — Ht 65.0 in | Wt 190.0 lb

## 2023-07-06 DIAGNOSIS — Z0000 Encounter for general adult medical examination without abnormal findings: Secondary | ICD-10-CM | POA: Diagnosis not present

## 2023-07-06 NOTE — Progress Notes (Signed)
 Subjective:   Alexandra Bishop is a 78 y.o. who presents for a Medicare Wellness preventive visit.  Visit Complete: Virtual I connected with  Alexandra Bishop on 07/06/23 by a audio enabled telemedicine application and verified that I am speaking with the correct person using two identifiers.  Patient Location: Home  Provider Location: Home Office  I discussed the limitations of evaluation and management by telemedicine. The patient expressed understanding and agreed to proceed.  Vital Signs: Because this visit was a virtual/telehealth visit, some criteria may be missing or patient reported. Any vitals not documented were not able to be obtained and vitals that have been documented are patient reported.  VideoDeclined- This patient declined Librarian, academic. Therefore the visit was completed with audio only.  Persons Participating in Visit: Patient.  AWV Questionnaire: No: Patient Medicare AWV questionnaire was not completed prior to this visit.  Cardiac Risk Factors include: advanced age (>68men, >80 women);dyslipidemia;obesity (BMI >30kg/m2);hypertension     Objective:    Today's Vitals   07/06/23 1126  Weight: 190 lb (86.2 kg)  Height: 5\' 5"  (1.651 m)   Body mass index is 31.62 kg/m.     07/06/2023   11:46 AM 07/05/2022   12:35 PM 07/03/2021   11:28 AM 03/06/2020   10:45 AM 03/06/2019   10:57 AM 01/11/2018    1:59 PM 01/10/2017    2:40 PM  Advanced Directives  Does Patient Have a Medical Advance Directive? Yes Yes No No Yes No No  Type of Estate agent of [place];Living will Healthcare Power of [place];Living will   Healthcare Power of [place];Living will    Does patient want to make changes to medical advance directive?  No - Patient declined   No - Patient declined Yes (MAU/Ambulatory/Procedural Areas - Information given) No - Patient declined  Copy of Healthcare Power of Attorney in Chart? No - copy requested No - copy  requested   No - copy requested    Would patient like information on creating a medical advance directive?   No - Patient declined No - Patient declined       Current Medications (verified) Outpatient Encounter Medications as of 07/06/2023  Medication Sig   aspirin EC 81 MG tablet Take 81 mg by mouth every other day.   atorvastatin (LIPITOR) 20 MG tablet Take 1 tablet (20 mg total) by mouth daily.   ELIDEL 1 % cream    fluocinonide (LIDEX) 0.05 % external solution Apply topically.   folic acid (FOLVITE) 1 MG tablet Take 1 mg by mouth daily. Take 1 tablet by mouth daily.   gabapentin (NEURONTIN) 100 MG capsule TAKE 1 CAPSULE BY MOUTH FOUR TIMES A DAY--- (INS ONLY COVERS 90 AT A TIME, MD AWARE)   levothyroxine (SYNTHROID) 88 MCG tablet TAKE 1 TABLET BY MOUTH EVERY DAY   losartan (COZAAR) 50 MG tablet TAKE 1 TABLET BY MOUTH EVERY DAY   methotrexate (RHEUMATREX) 2.5 MG tablet Take 2.5 mg by mouth once a week. Caution:Chemotherapy. Protect from light.Take 6 tablets by mouth every 7 days all on the same day.   Multiple Vitamin (MULTIVITAMIN) tablet Take 1 tablet by mouth every other day.   nitroGLYCERIN (NITROGLYN) 2 % ointment Apply small amount to toes daily PRN for Raynaud's symptoms   amLODipine (NORVASC) 10 MG tablet  (Patient not taking: Reported on 07/06/2023)   CALCIUM-MAGNESIUM-ZINC PO Take by mouth. Twice weekly (Patient not taking: Reported on 07/06/2023)   clobetasol cream (TEMOVATE) 0.05 % Apply topically  2 (two) times daily. Arms and legs for poison ivy as needed (Patient not taking: Reported on 07/06/2023)   cyanocobalamin 500 MCG tablet Take 500 mcg by mouth once a week. (Patient not taking: Reported on 07/06/2023)   ketoconazole (NIZORAL) 2 % cream Apply topically. (Patient not taking: Reported on 07/06/2023)   nortriptyline (PAMELOR) 10 MG capsule Take by mouth. (Patient not taking: Reported on 07/06/2023)   polyethylene glycol powder (GLYCOLAX/MIRALAX) 17 GM/SCOOP powder Take 17 g by  mouth daily as needed for mild constipation. (Patient not taking: Reported on 07/06/2023)   Potassium Gluconate 595 MG CAPS Take by mouth every other day. (Patient not taking: Reported on 07/06/2023)   No facility-administered encounter medications on file as of 07/06/2023.    Allergies (verified) Cymbalta [duloxetine hcl], Duloxetine, Hydrocodone, Shellfish allergy, and Tramadol   History: Past Medical History:  Diagnosis Date   Chronic pain of right ankle 11/09/2017   Heel fracture 01/30/2020   Hypothyroidism    Past Surgical History:  Procedure Laterality Date   BREAST BIOPSY Right    neg   broken ankle - RT     BUNIONECTOMY  2015   Family History  Problem Relation Age of Onset   Cancer Mother        sarcoma   Heart disease Brother    Cancer Maternal Grandmother        breast   Breast cancer Maternal Grandmother    Diabetes Maternal Grandfather    Cancer Paternal Grandmother        breast   Cancer Other        family hx   Thyroid disease Other        family hx   Thyroid cancer Neg Hx    Social History   Socioeconomic History   Marital status: Married    Spouse name: Not on file   Number of children: Not on file   Years of education: Not on file   Highest education level: Not on file  Occupational History   Not on file  Tobacco Use   Smoking status: Never   Smokeless tobacco: Never   Tobacco comments:    tobacco use - no   Vaping Use   Vaping status: Never Used  Substance and Sexual Activity   Alcohol use: No   Drug use: No   Sexual activity: Never  Other Topics Concern   Not on file  Social History Narrative   From [place] originally. Followed daughter to [place] and son in [place] .    Married; retired - office work; gets regular exercise - walks a mile most days. [employer]. Husband and her are following Paleo diet.    Social Drivers of Corporate investment banker Strain: Low Risk  (07/06/2023)   Overall Financial Resource Strain  (CARDIA)    Difficulty of Paying Living Expenses: Not hard at all  Food Insecurity: No Food Insecurity (07/06/2023)   Hunger Vital Sign    Worried About Running Out of Food in the Last Year: Never true    Ran Out of Food in the Last Year: Never true  Transportation Needs: No Transportation Needs (07/06/2023)   PRAPARE - Administrator, Civil Service (Medical): No    Lack of Transportation (Non-Medical): No  Physical Activity: Inactive (07/06/2023)   Exercise Vital Sign    Days of Exercise per Week: 0 days    Minutes of Exercise per Session: 0 min  Stress: No Stress Concern Present (07/06/2023)  [employer] of Occupational Health - Occupational Stress Questionnaire    Feeling of Stress : Only a little  Social Connections: Moderately Integrated (07/06/2023)   Social Connection and Isolation Panel [NHANES]    Frequency of Communication with Friends and Family: More than three times a week    Frequency of Social Gatherings with Friends and Family: Once a week    Attends Religious Services: More than 4 times per year    Active Member of [employer] or Organizations: No    Attends Engineer, structural: Never    Marital Status: Married    Tobacco Counseling Counseling given: Not Answered Tobacco comments: tobacco use - no     Clinical Intake:  Pre-visit preparation completed: Yes  Pain : No/denies pain     BMI - recorded: 31.62 Nutritional Status: BMI > 30  Obese Nutritional Risks: None Diabetes: No  Lab Results  Component Value Date   HGBA1C 5.7 03/07/2023   HGBA1C 5.8 (H) 09/10/2022   HGBA1C 5.8 03/08/2022     How often do you need to have someone help you when you read instructions, pamphlets, or other written materials from your doctor or pharmacy?: 1 - Never  Interpreter Needed?: No  Information entered by :: R. Jhoselin Crume LPN   Activities of Daily Living     07/06/2023   11:27 AM  In your present state of health, do you have any difficulty  performing the following activities:  Hearing? 0  Vision? 0  Difficulty concentrating or making decisions? 0  Walking or climbing stairs? 1  Dressing or bathing? 0  Doing errands, shopping? 0  Preparing Food and eating ? N  Using the Toilet? N  In the past six months, have you accidently leaked urine? N  Do you have problems with loss of bowel control? N  Managing your Medications? N  Managing your Finances? Y  Comment husband Database administrator or managing your Housekeeping? Y    No care team member to display  Indicate any recent Medical Services you may have received from other than [facility] providers in the past year (date may be approximate).     Assessment:   This is a routine wellness examination for Maekayla.  Hearing/Vision screen Hearing Screening - Comments:: No issues Vision Screening - Comments:: Contacts and glasses   Goals Addressed             This Visit's Progress    Patient Stated       Wants to loss some weight       Depression Screen     07/06/2023   11:37 AM 03/07/2023   11:18 AM 12/13/2022    1:49 PM 09/10/2022    1:50 PM 07/05/2022   12:44 PM 06/09/2022    2:04 PM 03/10/2022    2:52 PM  PHQ 2/9 Scores  PHQ - 2 Score 0 0 0 0 0 0 0  PHQ- 9 Score 3 0 0 0 0 0 0    Fall Risk     07/06/2023   11:31 AM 03/07/2023   11:18 AM 12/13/2022    1:49 PM 09/10/2022    1:50 PM 07/05/2022   12:44 PM  Fall Risk   Falls in the past year? 0 0 0 0 0  Number falls in past yr: 0 0 0 0 0  Injury with Fall? 0 0 0 0 0  Risk for fall due to : No Fall Risks No Fall Risks No Fall Risks No Fall Risks  Follow up Falls prevention discussed;Falls evaluation completed Falls evaluation completed Falls evaluation completed Falls evaluation completed Falls evaluation completed;Falls prevention discussed    MEDICARE RISK AT HOME:  Medicare Risk at Home Any stairs in or around the home?: Yes If so, are there any without handrails?: No Home free of loose throw rugs in  walkways, pet beds, electrical cords, etc?: Yes Adequate lighting in your home to reduce risk of falls?: Yes Life alert?: No Use of a cane, walker or w/c?: No Grab bars in the bathroom?: Yes Shower chair or bench in shower?: No Elevated toilet seat or a handicapped toilet?: No  TIMED UP AND GO:  Was the test performed?  No  Cognitive Function: 6CIT completed    01/10/2017    3:03 PM 01/08/2016    3:40 PM 01/07/2015    3:33 PM  MMSE - Mini Mental State Exam  Orientation to time 5 5 5   Orientation to Place 5 5 5   Registration 3 3 3   Attention/ Calculation 5 5 5   Recall 3 3 3   Language- name 2 objects 2 2 2   Language- repeat 1 1 1   Language- follow 3 step command 3 3 3   Language- read & follow direction 1 1 1   Write a sentence 1 1 1   Copy design 1 1 1   Total score 30 30 30         07/06/2023   11:46 AM 07/05/2022   12:49 PM 03/06/2020   10:56 AM 03/06/2019   10:59 AM 01/11/2018    2:00 PM  6CIT Screen  What Year? 0 points 0 points 0 points 0 points 0 points  What month? 0 points 0 points 0 points 0 points 0 points  What time?  0 points 0 points 0 points 0 points  Count back from 20 0 points 0 points 0 points 0 points 0 points  Months in reverse 2 points 0 points 0 points 0 points 0 points  Repeat phrase 2 points 0 points 4 points 0 points 0 points  Total Score  0 points 4 points 0 points 0 points    Immunizations Immunization History  Administered Date(s) Administered   Fluad Quad(high Dose 65+) 12/01/2018, 12/17/2019, 12/02/2020, 12/09/2021   Fluad Trivalent(High Dose 65+) 11/08/2017, 12/13/2022   Influenza Split 01/04/2011, 11/24/2011, 12/13/2013   Influenza, High Dose Seasonal PF 12/24/2016   Influenza,inj,Quad PF,6+ Mos 11/12/2015   Influenza,inj,quad, With Preservative 11/12/2015   Influenza-Unspecified 01/03/2013, 12/23/2014, 11/12/2015, 12/24/2016, 11/08/2017, 12/12/2017   Moderna Sars-Covid-2 Vaccination 09/03/2020   PFIZER Comirnaty(Gray Top)Covid-19  Tri-Sucrose Vaccine 04/09/2019, 04/30/2019, 12/21/2019   PFIZER(Purple Top)SARS-COV-2 Vaccination 04/09/2019, 04/30/2019, 12/21/2019   Pneumococcal Conjugate-13 12/03/2013, 10/18/2016   Pneumococcal Polysaccharide-23 07/29/2009, 11/08/2017   Rsv, Bivalent, Protein Subunit Rsvpref,pf Pattricia Bores) 01/14/2023   Tdap 01/12/2008, 08/11/2015   Unspecified SARS-COV-2 Vaccination 12/23/2021   Zoster Recombinant(Shingrix) 07/15/2022, 10/22/2022   Zoster, Live 11/24/2011    Screening Tests Health Maintenance  Topic Date Due   COVID-19 Vaccine (9 - 2024-25 season) 11/21/2022   Medicare Annual Wellness (AWV)  07/05/2023   INFLUENZA VACCINE  10/21/2023   DTaP/Tdap/Td (3 - Td or Tdap) 08/10/2025   Pneumonia Vaccine 90+ Years old  Completed   DEXA SCAN  Completed   Hepatitis C Screening  Completed   Zoster Vaccines- Shingrix  Completed   HPV VACCINES  Aged Out   Meningococcal B Vaccine  Aged Out   Colonoscopy  Discontinued    Health Maintenance  Health Maintenance Due  Topic Date Due  COVID-19 Vaccine (9 - 2024-25 season) 11/21/2022   Medicare Annual Wellness (AWV)  07/05/2023   Health Maintenance Items Addressed: Discussed the need to update covid vaccine  Additional Screening:  Vision Screening: Recommended annual ophthalmology exams for early detection of glaucoma and other disorders of the eye. Up to date [facility]  Dental Screening: Recommended annual dental exams for proper oral hygiene  Community Resource Referral / Chronic Care Management: CRR required this visit?  No   CCM required this visit?  No     Plan:     I have personally reviewed and noted the following in the patient's chart:   Medical and social history Use of alcohol, tobacco or illicit drugs  Current medications and supplements including opioid prescriptions. Patient is not currently taking opioid prescriptions. Functional ability and status Nutritional status Physical activity Advanced  directives List of other physicians Hospitalizations, surgeries, and ER visits in previous 12 months Vitals Screenings to include cognitive, depression, and falls Referrals and appointments  In addition, I have reviewed and discussed with patient certain preventive protocols, quality metrics, and best practice recommendations. A written personalized care plan for preventive services as well as general preventive health recommendations were provided to patient.     Felicitas Horse, LPN   6/96/2952   After Visit Summary: (MyChart) Due to this being a telephonic visit, the after visit summary with patients personalized plan was offered to patient via MyChart   Notes: Nothing significant to report at this time.

## 2023-07-06 NOTE — Patient Instructions (Signed)
 Alexandra Bishop , Thank you for taking time to come for your Medicare Wellness Visit. I appreciate your ongoing commitment to your health goals. Please review the following plan we discussed and let me know if I can assist you in the future.   Referrals/Orders/Follow-Ups/Clinician Recommendations: Consider updating your covid vaccine.  This is a list of the screening recommended for you and due dates:  Health Maintenance  Topic Date Due   COVID-19 Vaccine (9 - 2024-25 season) 11/21/2022   Flu Shot  10/21/2023   Mammogram  05/15/2024   Medicare Annual Wellness Visit  07/05/2024   DTaP/Tdap/Td vaccine (3 - Td or Tdap) 08/10/2025   Pneumonia Vaccine  Completed   DEXA scan (bone density measurement)  Completed   Hepatitis C Screening  Completed   Zoster (Shingles) Vaccine  Completed   HPV Vaccine  Aged Out   Meningitis B Vaccine  Aged Out   Colon Cancer Screening  Discontinued    Advanced directives: (Copy Requested) Please bring a copy of your health care power of attorney and living will to the office to be added to your chart at your convenience. You can mail to [facility] [address] [place], [place] [address] or email to [email or web address]  Next Medicare Annual Wellness Visit scheduled for next year: Yes 07/09/24 @ 11:30

## 2023-07-11 DIAGNOSIS — Z23 Encounter for immunization: Secondary | ICD-10-CM | POA: Diagnosis not present

## 2023-07-22 ENCOUNTER — Ambulatory Visit (INDEPENDENT_AMBULATORY_CARE_PROVIDER_SITE_OTHER)

## 2023-07-22 VITALS — BP 124/62 | HR 60 | Temp 98.7°F | Ht 65.0 in | Wt 193.6 lb

## 2023-07-22 DIAGNOSIS — E039 Hypothyroidism, unspecified: Secondary | ICD-10-CM | POA: Diagnosis not present

## 2023-07-22 DIAGNOSIS — M1990 Unspecified osteoarthritis, unspecified site: Secondary | ICD-10-CM

## 2023-07-22 DIAGNOSIS — R7303 Prediabetes: Secondary | ICD-10-CM

## 2023-07-22 DIAGNOSIS — Z6836 Body mass index (BMI) 36.0-36.9, adult: Secondary | ICD-10-CM

## 2023-07-22 DIAGNOSIS — M25551 Pain in right hip: Secondary | ICD-10-CM | POA: Diagnosis not present

## 2023-07-22 DIAGNOSIS — G8929 Other chronic pain: Secondary | ICD-10-CM | POA: Diagnosis not present

## 2023-07-22 DIAGNOSIS — E785 Hyperlipidemia, unspecified: Secondary | ICD-10-CM | POA: Diagnosis not present

## 2023-07-22 DIAGNOSIS — I10 Essential (primary) hypertension: Secondary | ICD-10-CM

## 2023-07-22 DIAGNOSIS — R5383 Other fatigue: Secondary | ICD-10-CM

## 2023-07-22 DIAGNOSIS — M47812 Spondylosis without myelopathy or radiculopathy, cervical region: Secondary | ICD-10-CM | POA: Diagnosis not present

## 2023-07-22 DIAGNOSIS — E66812 Obesity, class 2: Secondary | ICD-10-CM | POA: Diagnosis not present

## 2023-07-22 LAB — HEMOGLOBIN A1C: Hgb A1c MFr Bld: 5.8 % (ref 4.6–6.5)

## 2023-07-22 LAB — LIPID PANEL
Cholesterol: 119 mg/dL (ref 0–200)
HDL: 52.7 mg/dL (ref >39.00)
LDL Cholesterol: 38 mg/dL (ref 0–99)
NonHDL: 66.06
Total CHOL/HDL Ratio: 2
Triglycerides: 142 mg/dL (ref 0.0–149.0)
VLDL: 28.4 mg/dL (ref 0.0–40.0)

## 2023-07-22 LAB — COMPREHENSIVE METABOLIC PANEL WITH GFR
ALT: 22 U/L (ref 0–35)
AST: 25 U/L (ref 0–37)
Albumin: 4.4 g/dL (ref 3.5–5.2)
Alkaline Phosphatase: 78 U/L (ref 39–117)
BUN: 12 mg/dL (ref 6–23)
CO2: 28 meq/L (ref 19–32)
Calcium: 9.9 mg/dL (ref 8.4–10.5)
Chloride: 105 meq/L (ref 96–112)
Creatinine, Ser: 0.78 mg/dL (ref 0.40–1.20)
GFR: 71.97 mL/min (ref >60.00)
Glucose, Bld: 92 mg/dL (ref 70–99)
Potassium: 4.1 meq/L (ref 3.5–5.1)
Sodium: 140 meq/L (ref 135–145)
Total Bilirubin: 1.1 mg/dL (ref 0.2–1.2)
Total Protein: 6.7 g/dL (ref 6.0–8.3)

## 2023-07-22 LAB — VITAMIN D 25 HYDROXY (VIT D DEFICIENCY, FRACTURES): VITD: 32.2 ng/mL (ref 30.00–100.00)

## 2023-07-22 LAB — TSH: TSH: 1.39 u[IU]/mL (ref 0.35–5.50)

## 2023-07-22 MED ORDER — GABAPENTIN 100 MG PO CAPS: 100.0000 mg | ORAL_CAPSULE | Freq: Two times a day (BID) | ORAL | 1 refills | Status: DC

## 2023-07-22 MED ORDER — ATORVASTATIN CALCIUM 20 MG PO TABS: 20.0000 mg | ORAL_TABLET | Freq: Every day | ORAL | 3 refills | Status: AC

## 2023-07-22 MED ORDER — LOSARTAN POTASSIUM 50 MG PO TABS: 50.0000 mg | ORAL_TABLET | Freq: Every day | ORAL | 3 refills | Status: AC

## 2023-07-22 NOTE — Assessment & Plan Note (Signed)
 Chronic issue.  BP within goal.  Continue amlodipine  10 mg daily and losartan  50 mg daily. Check CMP today.

## 2023-07-22 NOTE — Patient Instructions (Signed)
 1) Apply preservative/fragrance free lotion twice a day for dry skin. Some examples includes: CeraVe, Eucerin,Vanicream.  2) We are reducing dose of Gabapentin  to 100 mg, twice a day. Take one at bedtime and one tablet in the morning.   3) We will reach out to you with lab results and recommendations in case we need to add or change current medications.   4) I recommend continuing moderate intensity exercise, increasing fiber intake to help soften stool and reduce risk of diverticulosis. Staying hydrated, eating whole grains, fruits, vegetables can be helpful.   5) Follow up in 3 months.

## 2023-07-22 NOTE — Assessment & Plan Note (Signed)
 Likely osteoarthritis, on Gabapentin  100 mg, three times a day. PDMP reviewed, appropriate.  Recommend reducing dose of Gabapentin  100 mg, twice a day. S/E of abruptly stopping medications including anxiety, insomnia, seizure discussed. Recommend reducing dose to 100 mg once a day in 3 months in pain controlled. Refill sent.

## 2023-07-22 NOTE — Assessment & Plan Note (Signed)
 Chronic issue.  Check TSH. Continue Synthroid  88 mcg daily. Dry skin recommendations:  Use preservative/fragrance free lotion twice a day for dry skin. Some examples includes: CeraVe, Eucerin,Vanicream.

## 2023-07-22 NOTE — Progress Notes (Signed)
 Established Patient Office Visit.    Subjective  Patient ID: Alexandra Bishop, female    DOB: November 25, 1945  Age: 78 y.o. MRN: [id number]  Chief Complaint  Patient presents with   Establish Care    She  has a past medical history of Chronic pain of right ankle (11/09/2017), Heel fracture (01/30/2020), and Hypothyroidism.  HPI Patient of Dr. Lovetta Rucks presenting for transfer of care. Last OV with him was on 03/07/2023.  1) Sees ob/gyn: Dr. Alvia Awkward at [facility] clinic for regular ob/gyn care. Denies urinary incontinence, new concerns.   2) Psoriasis, Raynaud's symptom: Sees rheumatologist Dr. Lydia Sams with [facility]. Last OV was on 06/23/23. Currently on Methotrexate, daily folic acid, Norvasc  10 mg and prn NTG ointment.  She also sees [facility].   3) Hypothyroidism: Last TSH from 03/07/23 was  normal. On Synthroid  88 mcg daily which she takes first thing in the morning on empty stomach.  Not due for refill till 05/16/2024. Patient states she has dry skin. Has applied lotion prn.   4) Hyperlipidemia 5) Obesity: - On Atorvastatin   20 mg daily.  Last lipid was checked on 05/07/22 with LDL of 27. She did have elevated triglycerides. CMP was normal on 03/07/23. Patient drank smoothie this morning. Smoothie contained peanut butter, milk, banana among other ingredients. She eats mostly home made food. Goes on walk regularly.   6) Prediabetes:  A1c from 03/07/23 was 5.7%. Her husband has diabetes and at home they eat mostly diabetic friendly meals.   7) Thickening of the left adrenal gland, benign and no follow up recommended based upon CT adrenal WO contrast from 09/10/21. No abdominal pain, constipation, new concerns.   8) CT adrenal without contrast from 09/10/2021 also showed moderate aortic atherosclerosis.   9) CT adrenal without contrast from 09/10/2021 colonic diverticulosis, fat containing umbilical hernia.   10) HTN: Does not check BP at home. Denies chest pain, lower leg edema.  Currently on Losartan  50 mg, amlodipine  10 mg daily.   11) Cervical spine arthritis, right hip pain:  - Currently taking Gabapentin  100 mg, three times a day for right hip pain and cervical spine pain. Patient is not sure why she was started on Gabapentin  and is not sure if this has been helpful in improving pain.  - Take Gabapentin  100 mg in the morning at 8 PM, and 2 tablets when she wakes up at night which is usually 4 hours after the first doe.    MRI cervical spine from 10/05/2019:  -Mild multilevel spondylosis with reversal of lordosis and minimal grade 1 C4-5 anterolisthesis. -Mild C4-7 spinal canal narrowing. -Mild left C3-4, right C5-6 and bilateral C6-7 neural foraminal narrowing.Why gabapentin ?, Last refill: PDMP reviewed.  She is going through PT for right hip pain. Currently no new symptoms.  She denies -neck pain -numbness, tinglings of arms, hands, feet -coordination/balance problem  12) Fatigue: Seems to be ongoing for about 2 weeks now. Patient's husband has depression and she reports this to be stressor for her.  She denies feeling depressed, SI/HI. She goes to bed around 10 PM and wakes up around 6 AM. Denies SOB, dizziness.   13)  Patient lives at home with her husband. She has a daughter, who lives in town. Son is coming for a visit in August, an patient plans on having cataract surgery at that time.   ROSAs per HPI    Objective:     BP 124/62   Pulse 60   Temp 98.7 F (37.1  C) (Oral)   Ht 5\' 5"  (1.651 m)   Wt 193 lb 9.6 oz (87.8 kg)   SpO2 96%   BMI 32.22 kg/m      07/22/2023   10:07 AM 07/06/2023   11:37 AM 03/07/2023   11:18 AM  Depression screen PHQ 2/9  Decreased Interest 0 0 0  Down, Depressed, Hopeless 0 0 0  PHQ - 2 Score 0 0 0  Altered sleeping 0 0 0  Tired, decreased energy 0 3 0  Change in appetite 0 0 0  Feeling bad or failure about yourself  0 0 0  Trouble concentrating 0 0 0  Moving slowly or fidgety/restless 0 0 0  Suicidal  thoughts 0 0 0  PHQ-9 Score 0 3 0  Difficult doing work/chores Not difficult at all Somewhat difficult Not difficult at all      07/22/2023   10:07 AM 03/07/2023   11:18 AM 12/13/2022    1:49 PM 09/10/2022    1:50 PM  GAD 7 : Generalized Anxiety Score  Nervous, Anxious, on Edge 0 0 0 0  Control/stop worrying 0 0 0 0  Worry too much - different things 0 0 0 0  Trouble relaxing 0 0 0 0  Restless 0 0 0 0  Easily annoyed or irritable 0 0 0 0  Afraid - awful might happen 0 0 0 0  Total GAD 7 Score 0 0 0 0  Anxiety Difficulty Not difficult at all Not difficult at all Not difficult at all Not difficult at all    Physical Exam Constitutional:      Appearance: Normal appearance.  HENT:     Head: Normocephalic and atraumatic.     Right Ear: Tympanic membrane normal.     Left Ear: Tympanic membrane normal.     Nose: No congestion.     Mouth/Throat:     Mouth: Mucous membranes are moist.  Eyes:     Extraocular Movements: Extraocular movements intact.  Neck:     Thyroid : No thyroid  mass or thyroid  tenderness.  Cardiovascular:     Rate and Rhythm: Normal rate and regular rhythm.  Pulmonary:     Effort: Pulmonary effort is normal.     Breath sounds: Normal breath sounds.  Abdominal:     General: Bowel sounds are normal.     Palpations: Abdomen is soft.  Musculoskeletal:     Cervical back: Neck supple. No rigidity.     Right lower leg: No edema.     Left lower leg: No edema.  Skin:    General: Skin is warm.  Neurological:     Mental Status: She is alert and oriented to person, place, and time.  Psychiatric:        Mood and Affect: Mood normal.        Behavior: Behavior normal.        No results found for any visits on 07/22/23.  The ASCVD Risk score (Arnett DK, et al., 2019) failed to calculate for the following reasons:   The valid total cholesterol range is 130 to 320 mg/dL    Assessment & Plan:  Essential hypertension Assessment & Plan: Chronic issue.  BP within  goal.  Continue amlodipine  10 mg daily and losartan  50 mg daily. Check CMP today.   Orders: -     Losartan  Potassium; Take 1 tablet (50 mg total) by mouth daily.  Dispense: 90 tablet; Refill: 3  Cervical spine arthritis Assessment & Plan: Reviewed MRI cervical spine  from 10/05/2019 which showed mild arthritic changes. No new symptoms. Has not gone to PT and patient reports this has really not been a problem for her. If new symptoms recommend reevaluation.   Orders: -     Gabapentin ; Take 1 capsule (100 mg total) by mouth 2 (two) times daily. TAKE 1 CAPSULE BY MOUTH TWICE A DAY  Dispense: 90 capsule; Refill: 1  Prediabetes Assessment & Plan: Chronic issue.  Check A1c today.  Orders: -     Hemoglobin A1c  Hyperlipidemia, unspecified hyperlipidemia type Assessment & Plan: Chronic issue. Check lipid panel.  Continue Lipitor 20 mg daily. Lifestyle modifications including regular exercise, continuing healthy eating discussed.   Orders: -     Lipid panel  Hypothyroidism, unspecified type Assessment & Plan: Chronic issue.  Check TSH. Continue Synthroid  88 mcg daily. Dry skin recommendations:  Use preservative/fragrance free lotion twice a day for dry skin. Some examples includes: CeraVe, Eucerin,Vanicream.  Orders: -     TSH  Primary hypertension Assessment & Plan: Chronic issue.  BP within goal.  Continue amlodipine  10 mg daily and losartan  50 mg daily. Check CMP today.   Orders: -     Comprehensive metabolic panel with GFR  Class 2 severe obesity due to excess calories with serious comorbidity and body mass index (BMI) of 36.0 to 36.9 in adult [facility]) Assessment & Plan: Chronic issue. Plan as mentioned in hyperlipidemia.  Orders: -     VITAMIN D  25 Hydroxy (Vit-D Deficiency, Fractures)  Chronic right hip pain Assessment & Plan: Likely osteoarthritis, on Gabapentin  100 mg, three times a day. PDMP reviewed, appropriate.  Recommend reducing dose of Gabapentin  100 mg, twice a  day. S/E of abruptly stopping medications including anxiety, insomnia, seizure discussed. Recommend reducing dose to 100 mg once a day in 3 months in pain controlled. Refill sent.    Other fatigue Assessment & Plan: Chronic mild fatigue. Exam is normal. Will check TSH, vitamin D  level. Recommend reducing dose of Gabapentin  from 100 mg three times a day to twice a day. PHQ9 and GAD 7 reviewed which were normal. CBC from 06/2023 reviewed which was reassuring.    Other orders -     Atorvastatin  Calcium ; Take 1 tablet (20 mg total) by mouth daily.  Dispense: 90 tablet; Refill: 3     Return in about 3 months (around 10/22/2023) for Chronic follow up.   Jacklin Mascot, MD

## 2023-07-22 NOTE — Assessment & Plan Note (Signed)
 Chronic issue. Plan as mentioned in hyperlipidemia.

## 2023-07-22 NOTE — Assessment & Plan Note (Signed)
 Chronic issue. Check lipid panel.  Continue Lipitor 20 mg daily. Lifestyle modifications including regular exercise, continuing healthy eating discussed.

## 2023-07-22 NOTE — Assessment & Plan Note (Signed)
 Reviewed MRI cervical spine from 10/05/2019 which showed mild arthritic changes. No new symptoms. Has not gone to PT and patient reports this has really not been a problem for her. If new symptoms recommend reevaluation.

## 2023-07-22 NOTE — Assessment & Plan Note (Signed)
 Chronic mild fatigue. Exam is normal. Will check TSH, vitamin D  level. Recommend reducing dose of Gabapentin  from 100 mg three times a day to twice a day. PHQ9 and GAD 7 reviewed which were normal. CBC from 06/2023 reviewed which was reassuring.

## 2023-07-22 NOTE — Assessment & Plan Note (Signed)
Chronic issue.  Check A1c today.

## 2023-07-25 NOTE — Progress Notes (Signed)
 Please let the patient know I reviewed her recent lab results. Her HbA1c, cholesterol, vitamin D  level, thyroid  function, kidney and liver function looks stable. Continue current medications as it is and follow up with me as scheduled in August.   Thank you,  Jacklin Mascot, MD

## 2023-08-09 DIAGNOSIS — D1801 Hemangioma of skin and subcutaneous tissue: Secondary | ICD-10-CM | POA: Diagnosis not present

## 2023-08-09 DIAGNOSIS — L400 Psoriasis vulgaris: Secondary | ICD-10-CM | POA: Diagnosis not present

## 2023-08-09 DIAGNOSIS — L2989 Other pruritus: Secondary | ICD-10-CM | POA: Diagnosis not present

## 2023-08-09 DIAGNOSIS — L538 Other specified erythematous conditions: Secondary | ICD-10-CM | POA: Diagnosis not present

## 2023-08-09 DIAGNOSIS — L820 Inflamed seborrheic keratosis: Secondary | ICD-10-CM | POA: Diagnosis not present

## 2023-08-09 DIAGNOSIS — L821 Other seborrheic keratosis: Secondary | ICD-10-CM | POA: Diagnosis not present

## 2023-09-26 ENCOUNTER — Other Ambulatory Visit: Payer: Self-pay

## 2023-09-26 DIAGNOSIS — M47812 Spondylosis without myelopathy or radiculopathy, cervical region: Secondary | ICD-10-CM

## 2023-10-24 ENCOUNTER — Ambulatory Visit

## 2023-10-24 ENCOUNTER — Telehealth: Payer: Self-pay

## 2023-10-24 DIAGNOSIS — R7303 Prediabetes: Secondary | ICD-10-CM

## 2023-10-24 DIAGNOSIS — E039 Hypothyroidism, unspecified: Secondary | ICD-10-CM

## 2023-10-24 DIAGNOSIS — I10 Essential (primary) hypertension: Secondary | ICD-10-CM

## 2023-10-24 DIAGNOSIS — E785 Hyperlipidemia, unspecified: Secondary | ICD-10-CM

## 2023-10-24 DIAGNOSIS — M47812 Spondylosis without myelopathy or radiculopathy, cervical region: Secondary | ICD-10-CM

## 2023-10-24 NOTE — Telephone Encounter (Signed)
 Left message that appointment has been canceled/provider out-kp  Please reschedule appointment. MyChart message sent.

## 2023-11-01 DIAGNOSIS — H353131 Nonexudative age-related macular degeneration, bilateral, early dry stage: Secondary | ICD-10-CM | POA: Diagnosis not present

## 2023-11-01 DIAGNOSIS — E119 Type 2 diabetes mellitus without complications: Secondary | ICD-10-CM | POA: Diagnosis not present

## 2023-11-01 DIAGNOSIS — H43813 Vitreous degeneration, bilateral: Secondary | ICD-10-CM | POA: Diagnosis not present

## 2023-11-01 DIAGNOSIS — H2513 Age-related nuclear cataract, bilateral: Secondary | ICD-10-CM | POA: Diagnosis not present

## 2023-11-09 DIAGNOSIS — I7300 Raynaud's syndrome without gangrene: Secondary | ICD-10-CM | POA: Diagnosis not present

## 2023-11-09 DIAGNOSIS — L409 Psoriasis, unspecified: Secondary | ICD-10-CM | POA: Diagnosis not present

## 2023-11-10 DIAGNOSIS — L820 Inflamed seborrheic keratosis: Secondary | ICD-10-CM | POA: Diagnosis not present

## 2023-11-10 DIAGNOSIS — L538 Other specified erythematous conditions: Secondary | ICD-10-CM | POA: Diagnosis not present

## 2023-11-24 ENCOUNTER — Ambulatory Visit (INDEPENDENT_AMBULATORY_CARE_PROVIDER_SITE_OTHER)

## 2023-11-24 VITALS — BP 125/70 | HR 68 | Temp 98.4°F | Ht 65.0 in | Wt 183.6 lb

## 2023-11-24 DIAGNOSIS — R5383 Other fatigue: Secondary | ICD-10-CM | POA: Diagnosis not present

## 2023-11-24 DIAGNOSIS — L409 Psoriasis, unspecified: Secondary | ICD-10-CM | POA: Diagnosis not present

## 2023-11-24 DIAGNOSIS — G8929 Other chronic pain: Secondary | ICD-10-CM

## 2023-11-24 DIAGNOSIS — M25551 Pain in right hip: Secondary | ICD-10-CM | POA: Diagnosis not present

## 2023-11-24 DIAGNOSIS — E782 Mixed hyperlipidemia: Secondary | ICD-10-CM | POA: Diagnosis not present

## 2023-11-24 DIAGNOSIS — G930 Cerebral cysts: Secondary | ICD-10-CM | POA: Diagnosis not present

## 2023-11-24 DIAGNOSIS — I10 Essential (primary) hypertension: Secondary | ICD-10-CM

## 2023-11-24 DIAGNOSIS — E039 Hypothyroidism, unspecified: Secondary | ICD-10-CM | POA: Diagnosis not present

## 2023-11-24 DIAGNOSIS — Z23 Encounter for immunization: Secondary | ICD-10-CM | POA: Diagnosis not present

## 2023-11-24 DIAGNOSIS — E66811 Obesity, class 1: Secondary | ICD-10-CM

## 2023-11-24 DIAGNOSIS — E6609 Other obesity due to excess calories: Secondary | ICD-10-CM

## 2023-11-24 DIAGNOSIS — D3502 Benign neoplasm of left adrenal gland: Secondary | ICD-10-CM | POA: Diagnosis not present

## 2023-11-24 DIAGNOSIS — E661 Drug-induced obesity: Secondary | ICD-10-CM | POA: Insufficient documentation

## 2023-11-24 LAB — COMPREHENSIVE METABOLIC PANEL WITH GFR
ALT: 24 U/L (ref 0–35)
AST: 26 U/L (ref 0–37)
Albumin: 4.4 g/dL (ref 3.5–5.2)
Alkaline Phosphatase: 87 U/L (ref 39–117)
BUN: 12 mg/dL (ref 6–23)
CO2: 29 meq/L (ref 19–32)
Calcium: 9.4 mg/dL (ref 8.4–10.5)
Chloride: 103 meq/L (ref 96–112)
Creatinine, Ser: 0.82 mg/dL (ref 0.40–1.20)
GFR: 67.62 mL/min (ref >60.00)
Glucose, Bld: 88 mg/dL (ref 70–99)
Potassium: 4.3 meq/L (ref 3.5–5.1)
Sodium: 140 meq/L (ref 135–145)
Total Bilirubin: 0.9 mg/dL (ref 0.2–1.2)
Total Protein: 6.6 g/dL (ref 6.0–8.3)

## 2023-11-24 LAB — CBC WITH DIFFERENTIAL/PLATELET
Basophils Absolute: 0.1 K/uL (ref 0.0–0.1)
Basophils Relative: 0.9 % (ref 0.0–3.0)
Eosinophils Absolute: 0.2 K/uL (ref 0.0–0.7)
Eosinophils Relative: 2.1 % (ref 0.0–5.0)
HCT: 48.6 % — ABNORMAL HIGH (ref 36.0–46.0)
Hemoglobin: 15.7 g/dL — ABNORMAL HIGH (ref 12.0–15.0)
Lymphocytes Relative: 13.8 % (ref 12.0–46.0)
Lymphs Abs: 1.2 K/uL (ref 0.7–4.0)
MCHC: 32.3 g/dL (ref 30.0–36.0)
MCV: 89.6 fl (ref 78.0–100.0)
Monocytes Absolute: 0.5 K/uL (ref 0.1–1.0)
Monocytes Relative: 6.5 % (ref 3.0–12.0)
Neutro Abs: 6.5 K/uL (ref 1.4–7.7)
Neutrophils Relative %: 76.7 % (ref 43.0–77.0)
Platelets: 293 K/uL (ref 150.0–400.0)
RBC: 5.43 Mil/uL — ABNORMAL HIGH (ref 3.87–5.11)
RDW: 14.5 % (ref 11.5–15.5)
WBC: 8.4 K/uL (ref 4.0–10.5)

## 2023-11-24 LAB — TSH: TSH: 1.5 u[IU]/mL (ref 0.35–5.50)

## 2023-11-24 LAB — VITAMIN D 25 HYDROXY (VIT D DEFICIENCY, FRACTURES): VITD: 30.72 ng/mL (ref 30.00–100.00)

## 2023-11-24 LAB — B12 AND FOLATE PANEL
Folate: >22.9 ng/mL (ref >5.9)
Vitamin B-12: 269 pg/mL (ref 211–911)

## 2023-11-24 MED ORDER — LEVOTHYROXINE SODIUM 88 MCG PO TABS: 88.0000 ug | ORAL_TABLET | Freq: Every day | ORAL | 3 refills | Status: AC

## 2023-11-24 MED ORDER — GABAPENTIN 100 MG PO CAPS
100.0000 mg | ORAL_CAPSULE | Freq: Two times a day (BID) | ORAL | 3 refills | Status: AC
Start: 2023-11-24 — End: ?

## 2023-11-24 NOTE — Assessment & Plan Note (Signed)
 BP on arrival slightly elevated. Repeat BP within goal. Continue Losartan  50 mg daily. She is prescribed Amlodipine  10 mg by her rheumatologist for raynaud's which also helps with BP control.  Check CMP today.

## 2023-11-24 NOTE — Assessment & Plan Note (Signed)
 LDL within goal, continue Atorvastatin  20 mg daily.

## 2023-11-24 NOTE — Assessment & Plan Note (Signed)
 Patient has not seen a neurologist or had repeat imaging for this. She was seen by me for transfer of care by me on 07/2023 which patient does not recall. She apologizes for not recalling her appointment with me. She is oriented to time, place and person today. I recommend neurology evaluation to see if repeat imaging will be beneficial. Once we have lab results back, when we reach out to the patient I will recommend establishing care with neurology.

## 2023-11-24 NOTE — Assessment & Plan Note (Signed)
 PHQ9/GAD 7 reviewed, not concerning. Continue CBT twice a month.

## 2023-11-24 NOTE — Assessment & Plan Note (Addendum)
 Has tried physical therapy in the past. Discussed referral to physical therapy. Since symptoms has been stable with Gabapentin  she declines PT referral. Continue Gabapentin  100 mg, twice a day. PDMP reviewed.  Refill sent.

## 2023-11-24 NOTE — Patient Instructions (Addendum)
-   Continue Gabapentin  100 mg twice a day to help with right hip pain.  - Continue Losartan  50 mg daily for blood pressure control. Continue Synthroid  88 mcg for hypothyroidism and Atorvastatin  20 mg daily for cholesterol control.  - I am checking your thyroid  hormone level, CBC, CMP, vitamin D  level, B 12, folate level today. I will update you once I have the lab results.  - Follow up in 4 months or sooner if you need me.

## 2023-11-24 NOTE — Assessment & Plan Note (Signed)
 Need for influenza immunization, updated today.

## 2023-11-24 NOTE — Assessment & Plan Note (Signed)
 Methotrexate stopped by rheumatologist due to lack of efficacy. Awaiting dermatology and rheumatology for further evaluation and management.

## 2023-11-24 NOTE — Assessment & Plan Note (Signed)
 On Synthroid  88 mcg, continue. Check TSH today

## 2023-11-24 NOTE — Progress Notes (Signed)
 Established Patient Office Visit   Subjective  Patient ID: Alexandra Bishop, female    DOB: [birth year]  Age: 78 y.o. MRN: [id number]  Chief Complaint  Patient presents with   Hyperlipidemia   Hypothyroidism   Hypertension    She  has a past medical history of Arachnoid cyst (03/27/2021), Chronic pain of right ankle (11/09/2017), Concern about end of life (09/10/2022), Depression with anxiety (09/06/2018), Episodic tension-type headache, not intractable (10/14/2019), Heel fracture (01/30/2020), Hypothyroidism, and Postnasal drip (03/10/2022).   Discussed the use of AI scribe software for clinical note transcription with the patient, who gave verbal consent to proceed.  History of Present Illness Alexandra Bishop is an 78 year old female who presents for medication review and follow-up.  She is preparing for a preoperative appointment for eye surgery scheduled in November and is reviewing her medication list. Her current medications include amlodipine  10 mg daily for Raynaud's disease. She is established with [facility] rheumatologist Dr. Tobie. She uses NTG ointment BID mostly during winter months.   She has a h/o psoriasis and has tried [place] in the past. Did not work. During her visit with rheumatologist on 11/09/23 she was told Methotrexate is not helping with scalp psoriasis. She was recommended to stop taking Methotrexate, continue using topical Clobetasol  and follow up with dermatology for further management. Patient reports today is her last day of taking Methotrexate and has upcoming dermatology appointment.   She takes Losartan  50 mg daily for hypertension. Denies chest pain, palpitations. Does not check BP at home regularly.   She has a h/o chronic right hip pain. Has tried physical therapy in the past. Currently not interested in PT.  Continues to take Gabapentin  100 mg twice a day.   She take Synthroid  88 mcg for hypothyroidism and Atorvastatin  20 mg daily for cholesterol control. She  also takes Aspirin 81 mg and multivitamin every other day.    She feels 'weary' and attributes some of this to supporting her husband Prentice, who has had a challenging year with mental health issues. No memory concerns raised by family members and patient herself. She experiences fatigue and wonders if it might be related to mental health or other factors. Patient and her husband go through CBT twice a month with a psychologist.   - She is eating healthy, tries to exercise as tolerated and has noted some weight loss with her effort.    ROS As per HPI    Objective:     BP 125/70 (BP Location: Right Arm, Cuff Size: Large)   Pulse 68   Temp 98.4 F (36.9 C) (Oral)   Ht 5' 5 (1.651 m)   Wt 183 lb 9.6 oz (83.3 kg)   SpO2 99%   BMI 30.55 kg/m      11/24/2023    9:34 AM 07/22/2023   10:07 AM 07/06/2023   11:37 AM  Depression screen PHQ 2/9  Decreased Interest 0 0 0  Down, Depressed, Hopeless 0 0 0  PHQ - 2 Score 0 0 0  Altered sleeping 1 0 0  Tired, decreased energy 0 0 3  Change in appetite 0 0 0  Feeling bad or failure about yourself  0 0 0  Trouble concentrating 0 0 0  Moving slowly or fidgety/restless 0 0 0  Suicidal thoughts 0 0 0  PHQ-9 Score 1 0 3  Difficult doing work/chores Not difficult at all Not difficult at all Somewhat difficult      11/24/2023    9:34  AM 07/22/2023   10:07 AM 03/07/2023   11:18 AM 12/13/2022    1:49 PM  GAD 7 : Generalized Anxiety Score  Nervous, Anxious, on Edge 0 0 0 0  Control/stop worrying 0 0 0 0  Worry too much - different things 0 0 0 0  Trouble relaxing 0 0 0 0  Restless 0 0 0 0  Easily annoyed or irritable 0 0 0 0  Afraid - awful might happen 0 0 0 0  Total GAD 7 Score 0 0 0 0  Anxiety Difficulty Not difficult at all Not difficult at all Not difficult at all Not difficult at all      11/24/2023    9:34 AM 07/22/2023   10:07 AM 07/06/2023   11:37 AM  Depression screen PHQ 2/9  Decreased Interest 0 0 0  Down, Depressed, Hopeless 0 0 0   PHQ - 2 Score 0 0 0  Altered sleeping 1 0 0  Tired, decreased energy 0 0 3  Change in appetite 0 0 0  Feeling bad or failure about yourself  0 0 0  Trouble concentrating 0 0 0  Moving slowly or fidgety/restless 0 0 0  Suicidal thoughts 0 0 0  PHQ-9 Score 1 0 3  Difficult doing work/chores Not difficult at all Not difficult at all Somewhat difficult      11/24/2023    9:34 AM 07/22/2023   10:07 AM 03/07/2023   11:18 AM 12/13/2022    1:49 PM  GAD 7 : Generalized Anxiety Score  Nervous, Anxious, on Edge 0 0 0 0  Control/stop worrying 0 0 0 0  Worry too much - different things 0 0 0 0  Trouble relaxing 0 0 0 0  Restless 0 0 0 0  Easily annoyed or irritable 0 0 0 0  Afraid - awful might happen 0 0 0 0  Total GAD 7 Score 0 0 0 0  Anxiety Difficulty Not difficult at all Not difficult at all Not difficult at all Not difficult at all   SDOH Screenings   Food Insecurity: No Food Insecurity (07/06/2023)  Housing: Unknown (11/09/2023)   Received from [facility]  Transportation Needs: No Transportation Needs (07/06/2023)  Utilities: Not At Risk (07/06/2023)  Alcohol Screen: Low Risk  (07/06/2023)  Depression (PHQ2-9): Low Risk  (11/24/2023)  Financial Resource Strain: Low Risk  (07/06/2023)  Physical Activity: Inactive (07/06/2023)  Social Connections: Moderately Integrated (07/06/2023)  Stress: No Stress Concern Present (07/06/2023)  Tobacco Use: Low Risk  (11/24/2023)  Health Literacy: Adequate Health Literacy (07/06/2023)     Physical Exam Constitutional:      Appearance: She is not toxic-appearing.  HENT:     Head: Normocephalic and atraumatic.     Right Ear: Tympanic membrane normal.     Left Ear: Tympanic membrane normal.     Mouth/Throat:     Mouth: Mucous membranes are moist.  Cardiovascular:     Rate and Rhythm: Normal rate.     Heart sounds: No murmur heard. Pulmonary:     Effort: Pulmonary effort is normal.     Breath sounds: Normal breath sounds.   Abdominal:     Tenderness: There is no abdominal tenderness. There is no guarding.  Musculoskeletal:     Cervical back: No rigidity or tenderness.     Right lower leg: No edema.     Left lower leg: No edema.     Comments: B/L lower extremities varicose veins without ulceration, lower  leg edema.   Neurological:     Mental Status: She is alert and oriented to person, place, and time.  Psychiatric:        Mood and Affect: Mood normal.        No results found for any visits on 11/24/23.  The ASCVD Risk score (Arnett DK, et al., 2019) failed to calculate for the following reasons:   The 2019 ASCVD risk score is only valid for ages 71 to 67     Assessment & Plan:   Acquired hypothyroidism Assessment & Plan: On Synthroid  88 mcg, continue. Check TSH today  Orders: -     TSH -     Levothyroxine  Sodium; Take 1 tablet (88 mcg total) by mouth daily.  Dispense: 90 tablet; Refill: 3  Chronic right hip pain Assessment & Plan: Has tried physical therapy in the past. Discussed referral to physical therapy. Since symptoms has been stable with Gabapentin  she declines PT referral. Continue Gabapentin  100 mg, twice a day. PDMP reviewed.  Refill sent.   Orders: -     Gabapentin ; Take 1 capsule (100 mg total) by mouth 2 (two) times daily. TAKE 1 CAPSULE BY MOUTH TWICE A DAY  Dispense: 90 capsule; Refill: 3 -     CBC with Differential/Platelet  Other fatigue Assessment & Plan: Chronic mild fatigue. Fatigue possibly related to mental health or caregiver fatigue. Check vitamin B12, CMP, CBC, TSH to evaluate for reversible cause for fatigue.  Continue CBT twice a month.  Consider referral to neurology if labs normal.   Orders: -     B12 and Folate Panel  Class 1 obesity due to excess calories with serious comorbidity and body mass index (BMI) of 34.0 to 34.9 in adult Assessment & Plan: Has noticed weight loss with lifestyle modifications including healthier diet, exercise as tolerated.  Check vitamin D  today.   Orders: -     VITAMIN D  25 Hydroxy (Vit-D Deficiency, Fractures)  Needs flu shot Assessment & Plan: Need for influenza immunization, updated today.   Orders: -     Flu vaccine HIGH DOSE PF(Fluzone Trivalent)  Adenoma of left adrenal gland Assessment & Plan: Underwent imaging in 09/10/2021, no further evaluation recommended based on imaging. No abdominal pain.    Essential hypertension Assessment & Plan: BP on arrival slightly elevated. Repeat BP within goal. Continue Losartan  50 mg daily. She is prescribed Amlodipine  10 mg by her rheumatologist for raynaud's which also helps with BP control.  Check CMP today.   Orders: -     Comprehensive metabolic panel with GFR  Mixed hyperlipidemia Assessment & Plan: LDL within goal, continue Atorvastatin  20 mg daily.    Psoriasis Assessment & Plan: Methotrexate stopped by rheumatologist due to lack of efficacy. Awaiting dermatology and rheumatology for further evaluation and management.   Cerebral cysts Assessment & Plan: Patient has not seen a neurologist or had repeat imaging for this. She was seen by me for transfer of care by me on 07/2023 which patient does not recall. She apologizes for not recalling her appointment with me. She is oriented to time, place and person today. I recommend neurology evaluation to see if repeat imaging will be beneficial. Once we have lab results back, when we reach out to the patient I will recommend establishing care with neurology.    I personally spent a total of 60 minutes in the care of the patient today including preparing to see the patient, performing a medically appropriate exam/evaluation, counseling and educating, placing  orders, referring and communicating with other health care professionals, documenting clinical information in the EHR, independently interpreting results, and communicating results.   Return in about 4 months (around 03/25/2024) for Chronic follow up .    Luke Shade, MD

## 2023-11-24 NOTE — Assessment & Plan Note (Addendum)
 Chronic mild fatigue. Fatigue possibly related to mental health or caregiver fatigue. Check vitamin B12, CMP, CBC, TSH to evaluate for reversible cause for fatigue.  Continue CBT twice a month.  Consider referral to neurology if labs normal.

## 2023-11-24 NOTE — Assessment & Plan Note (Signed)
 Underwent imaging in 09/10/2021, no further evaluation recommended based on imaging. No abdominal pain.

## 2023-11-24 NOTE — Assessment & Plan Note (Signed)
 Has noticed weight loss with lifestyle modifications including healthier diet, exercise as tolerated. Check vitamin D  today.

## 2023-11-25 ENCOUNTER — Ambulatory Visit: Payer: Self-pay

## 2023-11-25 DIAGNOSIS — G930 Cerebral cysts: Secondary | ICD-10-CM

## 2023-11-25 NOTE — Progress Notes (Signed)
 Please let the patient know her recent blood shows stable CBC, vitamin D , kidney, liver function. Her B12 is on low normal side. I recommend she start taking vitamin B12, 1000 mcg daily. I have sent prescription for this to her pharmacy. If pharmacy does not cover this she can get this over the counter as well. When reviewing her old imaging records I see that she has a slow growing cyst in her brain, last imaging from 10/09/2019 showed it to be about the size of a grape. I recommend establishing care with neurology for close neurological evaluation to determine further imaging. If she is willing I will go ahead and put a referral.   Thank you,  Luke Shade, MD

## 2023-11-27 NOTE — Telephone Encounter (Signed)
 1. Epidermoid cyst of brain (Primary) - Ambulatory referral to Neurology  2. Xanthogranuloma of choroid plexus - Ambulatory referral to Neurology   Luke Shade, MD

## 2023-11-29 DIAGNOSIS — H2511 Age-related nuclear cataract, right eye: Secondary | ICD-10-CM | POA: Diagnosis not present

## 2023-11-29 DIAGNOSIS — H2512 Age-related nuclear cataract, left eye: Secondary | ICD-10-CM | POA: Diagnosis not present

## 2023-11-30 ENCOUNTER — Other Ambulatory Visit: Payer: Self-pay

## 2023-11-30 DIAGNOSIS — Z23 Encounter for immunization: Secondary | ICD-10-CM

## 2023-11-30 NOTE — Telephone Encounter (Signed)
 Pt is requesting a covid vaccination, to be administered at her preferred pharmacy [facility] on [address], [place]. Please reach out to pt when script has been sent over/is ready. -kh

## 2023-11-30 NOTE — Addendum Note (Signed)
 Addended by: Reagann Dolce on: 11/30/2023 04:09 PM   Modules accepted: Orders

## 2023-12-02 MED ORDER — COVID-19 MRNA VACC (MODERNA) 50 MCG/0.5ML IM SUSP: 0.5000 mL | Freq: Once | INTRAMUSCULAR | 0 refills | Status: AC

## 2023-12-02 NOTE — Addendum Note (Signed)
 Addended by: Taris Galindo on: 12/02/2023 12:16 PM   Modules accepted: Orders

## 2023-12-02 NOTE — Telephone Encounter (Unsigned)
 Copied from CRM [id number]. Topic: Clinical - Medication Question >> Dec 02, 2023 11:45 AM Ahlexyia S wrote: Reason for CRM: Pt is requesting to get the newest version of the Covid 19 vaccine Laren)  Preferred pharmacy:  [facility] [phone] [address], [place] - [address] [place] [address] Phone: [phone]

## 2023-12-02 NOTE — Telephone Encounter (Signed)
 1. Need for vaccination (Primary) - COVID-19 mRNA vaccine, Moderna, >/= 66yrs, (SPIKEVAX) injection; Inject 0.5 mLs into the muscle once for 1 dose.  Dispense: 0.5 mL; Refill: 0  Luke Shade, MD

## 2023-12-05 ENCOUNTER — Telehealth: Payer: Self-pay

## 2023-12-05 DIAGNOSIS — Z711 Person with feared health complaint in whom no diagnosis is made: Secondary | ICD-10-CM

## 2023-12-05 NOTE — Telephone Encounter (Signed)
 Pt has stopped by to ask about Neuro referral. It appears the referral was sent to [facility] in [place], Pt is asking to be referred to somewhere closeby as she lives in [place]. Please reach out to pt.

## 2023-12-06 DIAGNOSIS — Z23 Encounter for immunization: Secondary | ICD-10-CM | POA: Diagnosis not present

## 2023-12-06 NOTE — Addendum Note (Signed)
 Addended by: Taje Littler on: 12/06/2023 11:44 AM   Modules accepted: Orders

## 2023-12-06 NOTE — Telephone Encounter (Signed)
 1. Concern about memory (Primary) - Ambulatory referral to Neurology  Alexandra Shade, MD

## 2023-12-21 DIAGNOSIS — L218 Other seborrheic dermatitis: Secondary | ICD-10-CM | POA: Diagnosis not present

## 2023-12-21 DIAGNOSIS — Z79899 Other long term (current) drug therapy: Secondary | ICD-10-CM | POA: Diagnosis not present

## 2023-12-21 DIAGNOSIS — L400 Psoriasis vulgaris: Secondary | ICD-10-CM | POA: Diagnosis not present

## 2023-12-22 DIAGNOSIS — L400 Psoriasis vulgaris: Secondary | ICD-10-CM | POA: Diagnosis not present

## 2024-01-18 ENCOUNTER — Encounter: Payer: Self-pay | Admitting: Ophthalmology

## 2024-01-19 ENCOUNTER — Encounter: Payer: Self-pay | Admitting: Ophthalmology

## 2024-01-19 NOTE — Anesthesia Preprocedure Evaluation (Addendum)
 Anesthesia Evaluation  Patient identified by MRN, date of birth, ID band Patient awake    Reviewed: Allergy & Precautions, H&P , NPO status , Patient's Chart, lab work & pertinent test results, reviewed documented beta blocker date and time   History of Anesthesia Complications Negative for: history of anesthetic complications  Airway Mallampati: II  TM Distance: >3 FB Neck ROM: full    Dental  (+) Dental Advidsory Given, Missing   Pulmonary neg pulmonary ROS   Pulmonary exam normal breath sounds clear to auscultation       Cardiovascular Exercise Tolerance: Good hypertension, (-) angina (-) Past MI and (-) Cardiac Stents negative cardio ROS Normal cardiovascular exam(-) dysrhythmias (-) Valvular Problems/Murmurs Rhythm:regular Rate:Normal     Neuro/Psych  PSYCHIATRIC DISORDERS Anxiety Depression    negative neurological ROS     GI/Hepatic negative GI ROS, Neg liver ROS,,,  Endo/Other  Hypothyroidism    Renal/GU negative Renal ROS  negative genitourinary   Musculoskeletal   Abdominal   Peds  Hematology negative hematology ROS (+)   Anesthesia Other Findings Past Medical History: 03/27/2021: Arachnoid cyst No date: Arthritis 11/09/2017: Chronic pain of right ankle 09/10/2022: Concern about end of life 09/06/2018: Depression with anxiety 10/14/2019: Episodic tension-type headache, not intractable 01/30/2020: Heel fracture No date: Hypertension No date: Hypothyroidism 03/10/2022: Postnasal drip No date: Pre-diabetes No date: Psoriasis   Reproductive/Obstetrics negative OB ROS                              Anesthesia Physical Anesthesia Plan  ASA: 2  Anesthesia Plan: MAC   Post-op Pain Management:    Induction: Intravenous  PONV Risk Score and Plan: Midazolam and Treatment may vary due to age or medical condition  Airway Management Planned: Natural Airway and Nasal  Cannula  Additional Equipment:   Intra-op Plan:   Post-operative Plan:   Informed Consent: I have reviewed the patients History and Physical, chart, labs and discussed the procedure including the risks, benefits and alternatives for the proposed anesthesia with the patient or authorized representative who has indicated his/her understanding and acceptance.     Dental Advisory Given  Plan Discussed with: Anesthesiologist, CRNA and Surgeon  Anesthesia Plan Comments:         Anesthesia Quick Evaluation

## 2024-01-23 NOTE — Discharge Instructions (Signed)

## 2024-01-25 ENCOUNTER — Other Ambulatory Visit: Payer: Self-pay

## 2024-01-25 ENCOUNTER — Encounter
Admission: RE | Disposition: A | Discharge status: Home / Self Care | Payer: Self-pay | Source: Home / Self Care | Attending: Ophthalmology

## 2024-01-25 ENCOUNTER — Ambulatory Visit
Admission: RE | Admit: 2024-01-25 | Discharge: 2024-01-25 | Disposition: A | Discharge status: Home / Self Care | Attending: Ophthalmology | Admitting: Ophthalmology

## 2024-01-25 ENCOUNTER — Ambulatory Visit: Payer: Self-pay | Admitting: Anesthesiology

## 2024-01-25 ENCOUNTER — Encounter: Payer: Self-pay | Admitting: Ophthalmology

## 2024-01-25 DIAGNOSIS — H2511 Age-related nuclear cataract, right eye: Secondary | ICD-10-CM | POA: Insufficient documentation

## 2024-01-25 DIAGNOSIS — E785 Hyperlipidemia, unspecified: Secondary | ICD-10-CM | POA: Diagnosis not present

## 2024-01-25 DIAGNOSIS — E039 Hypothyroidism, unspecified: Secondary | ICD-10-CM | POA: Insufficient documentation

## 2024-01-25 DIAGNOSIS — F418 Other specified anxiety disorders: Secondary | ICD-10-CM | POA: Diagnosis not present

## 2024-01-25 DIAGNOSIS — I10 Essential (primary) hypertension: Secondary | ICD-10-CM | POA: Insufficient documentation

## 2024-01-25 HISTORY — DX: Essential (primary) hypertension: I10

## 2024-01-25 HISTORY — DX: Prediabetes: R73.03

## 2024-01-25 HISTORY — DX: Unspecified osteoarthritis, unspecified site: M19.90

## 2024-01-25 HISTORY — DX: Psoriasis, unspecified: L40.9

## 2024-01-25 HISTORY — PX: CATARACT EXTRACTION W/PHACO: SHX586

## 2024-01-25 SURGERY — PHACOEMULSIFICATION, CATARACT, WITH IOL INSERTION
Anesthesia: Monitor Anesthesia Care | Site: Eye | Laterality: Right

## 2024-01-25 MED ORDER — BRIMONIDINE TARTRATE-TIMOLOL 0.2-0.5 % OP SOLN
OPHTHALMIC | Status: DC | PRN
Start: 2024-01-25 — End: 2024-01-25
  Administered 2024-01-25: 1 [drp] via OPHTHALMIC

## 2024-01-25 MED ORDER — CYCLOPENTOLATE HCL 2 % OP SOLN
OPHTHALMIC | Status: AC
Start: 2024-01-25 — End: 2024-01-25
  Filled 2024-01-25: qty 2

## 2024-01-25 MED ORDER — FENTANYL CITRATE (PF) 100 MCG/2ML IJ SOLN
INTRAMUSCULAR | Status: AC
  Filled 2024-01-25: qty 2

## 2024-01-25 MED ORDER — SIGHTPATH DOSE#1 NA HYALUR & NA CHOND-NA HYALUR IO KIT
PACK | INTRAOCULAR | Status: DC | PRN
Start: 2024-01-25 — End: 2024-01-25
  Administered 2024-01-25: 1 via OPHTHALMIC

## 2024-01-25 MED ORDER — PHENYLEPHRINE HCL 10 % OP SOLN
1.0000 [drp] | OPHTHALMIC | Status: AC
  Administered 2024-01-25 (×3): 1 [drp] via OPHTHALMIC

## 2024-01-25 MED ORDER — SIGHTPATH DOSE#1 BSS IO SOLN
INTRAOCULAR | Status: DC | PRN
Start: 2024-01-25 — End: 2024-01-25
  Administered 2024-01-25: 69 mL via OPHTHALMIC

## 2024-01-25 MED ORDER — TETRACAINE HCL 0.5 % OP SOLN
1.0000 [drp] | OPHTHALMIC | Status: DC | PRN
  Administered 2024-01-25 (×3): 1 [drp] via OPHTHALMIC

## 2024-01-25 MED ORDER — CYCLOPENTOLATE HCL 2 % OP SOLN
1.0000 [drp] | OPHTHALMIC | Status: AC
  Administered 2024-01-25 (×3): 1 [drp] via OPHTHALMIC

## 2024-01-25 MED ORDER — PHENYLEPHRINE HCL 10 % OP SOLN
OPHTHALMIC | Status: AC
  Filled 2024-01-25: qty 5

## 2024-01-25 MED ORDER — TETRACAINE HCL 0.5 % OP SOLN
OPHTHALMIC | Status: AC
  Filled 2024-01-25: qty 4

## 2024-01-25 MED ORDER — MIDAZOLAM HCL (PF) 2 MG/2ML IJ SOLN
INTRAMUSCULAR | Status: DC | PRN
  Administered 2024-01-25: 1 mg via INTRAVENOUS

## 2024-01-25 MED ORDER — CEFUROXIME OPHTHALMIC INJECTION 1 MG/0.1 ML
INJECTION | OPHTHALMIC | Status: DC | PRN
Start: 2024-01-25 — End: 2024-01-25
  Administered 2024-01-25: 1 mg via INTRACAMERAL

## 2024-01-25 MED ORDER — FENTANYL CITRATE (PF) 100 MCG/2ML IJ SOLN
INTRAMUSCULAR | Status: DC | PRN
  Administered 2024-01-25: 50 ug via INTRAVENOUS

## 2024-01-25 MED ORDER — MIDAZOLAM HCL 2 MG/2ML IJ SOLN
INTRAMUSCULAR | Status: AC
  Filled 2024-01-25: qty 2

## 2024-01-25 MED ORDER — SIGHTPATH DOSE#1 BSS IO SOLN
INTRAOCULAR | Status: DC | PRN
Start: 2024-01-25 — End: 2024-01-25
  Administered 2024-01-25: 15 mL via INTRAOCULAR

## 2024-01-25 MED ORDER — LIDOCAINE HCL (PF) 2 % IJ SOLN
INTRAMUSCULAR | Status: DC | PRN
  Administered 2024-01-25: 2 mL

## 2024-01-25 SURGICAL SUPPLY — 8 items
FEE CATARACT SUITE SIGHTPATH (MISCELLANEOUS) ×1 IMPLANT
GLOVE BIOGEL PI IND STRL 8 (GLOVE) ×1 IMPLANT
GLOVE SURG LX STRL 7.5 STRW (GLOVE) ×1 IMPLANT
GLOVE SURG SYN 6.5 PF PI BL (GLOVE) ×1 IMPLANT
LENS IOL TECNIS EYHANCE 8.5 (Intraocular Lens) IMPLANT
NDL FILTER BLUNT 18X1 1/2 (NEEDLE) ×1 IMPLANT
NEEDLE FILTER BLUNT 18X1 1/2 (NEEDLE) ×1 IMPLANT
SYR 3ML LL SCALE MARK (SYRINGE) ×1 IMPLANT

## 2024-01-25 NOTE — Transfer of Care (Signed)
 Immediate Anesthesia Transfer of Care Note  Patient: Alexandra Bishop  Procedure(s) Performed: PHACOEMULSIFICATION, CATARACT, WITH IOL INSERTION 4.96 00:35.1 (Right: Eye)  Patient Location: PACU  Anesthesia Type: MAC  Level of Consciousness: awake, alert  and patient cooperative  Airway and Oxygen Therapy: Patient Spontanous Breathing and Patient connected to supplemental oxygen  Post-op Assessment: Post-op Vital signs reviewed, Patient's Cardiovascular Status Stable, Respiratory Function Stable, Patent Airway and No signs of Nausea or vomiting  Post-op Vital Signs: Reviewed and stable  Complications: No notable events documented.

## 2024-01-25 NOTE — Op Note (Signed)
 LOCATION:  [facility]   PREOPERATIVE DIAGNOSIS:    Nuclear sclerotic cataract right eye. H25.11   POSTOPERATIVE DIAGNOSIS:  Nuclear sclerotic cataract right eye.     PROCEDURE:  Phacoemusification with posterior chamber intraocular lens placement of the right eye   ULTRASOUND TIME: Procedure(s): PHACOEMULSIFICATION, CATARACT, WITH IOL INSERTION 4.96 00:35.1 (Right)  LENS:   Implant Name Type Inv. Item Serial No. Manufacturer Lot No. LRB No. Used Action  LENS IOL TECNIS EYHANCE 8.5 - [id number] Intraocular Lens LENS IOL TECNIS EYHANCE 8.5 [id number] [employer]  Right 1 Implanted         SURGEON:  Dene FABIENE Etienne, MD   ANESTHESIA:  Topical with tetracaine drops and 2% Xylocaine jelly, augmented with 1% preservative-free intracameral lidocaine.    COMPLICATIONS:  None.   DESCRIPTION OF PROCEDURE:  The patient was identified in the holding room and transported to the operating room and placed in the supine position under the operating microscope.  The right eye was identified as the operative eye and it was prepped and draped in the usual sterile ophthalmic fashion.   A 1 millimeter clear-corneal paracentesis was made at the 12:00 position.  0.5 ml of preservative-free 1% lidocaine was injected into the anterior chamber. The anterior chamber was filled with Viscoat viscoelastic.  A 2.4 millimeter keratome was used to make a near-clear corneal incision at the 9:00 position.  A curvilinear capsulorrhexis was made with a cystotome and capsulorrhexis forceps.  Balanced salt solution was used to hydrodissect and hydrodelineate the nucleus.   Phacoemulsification was then used in stop and chop fashion to remove the lens nucleus and epinucleus.  The remaining cortex was then removed using the irrigation and aspiration handpiece. Provisc was then placed into the capsular bag to distend it for lens placement.  A lens was then injected into the capsular bag.  The remaining  viscoelastic was aspirated.   Wounds were hydrated with balanced salt solution.  The anterior chamber was inflated to a physiologic pressure with balanced salt solution.  No wound leaks were noted. Cefuroxime 0.1 ml of a 10mg /ml solution was injected into the anterior chamber for a dose of 1 mg of intracameral antibiotic at the completion of the case.   Timolol and Brimonidine drops were applied to the eye.  The patient was taken to the recovery room in stable condition without complications of anesthesia or surgery.   Dennise Raabe 01/25/2024, 9:05 AM

## 2024-01-25 NOTE — Anesthesia Postprocedure Evaluation (Signed)
 Anesthesia Post Note  Patient: Alexandra Bishop  Procedure(s) Performed: PHACOEMULSIFICATION, CATARACT, WITH IOL INSERTION 4.96 00:35.1 (Right: Eye)  Patient location during evaluation: PACU Anesthesia Type: MAC Level of consciousness: awake and alert Pain management: pain level controlled Vital Signs Assessment: post-procedure vital signs reviewed and stable Respiratory status: spontaneous breathing, nonlabored ventilation, respiratory function stable and patient connected to nasal cannula oxygen Cardiovascular status: stable and blood pressure returned to baseline Postop Assessment: no apparent nausea or vomiting Anesthetic complications: no   No notable events documented.   Last Vitals:  Vitals:   01/25/24 0906 01/25/24 0912  BP: (!) 119/56 126/66  Pulse: (!) 56   Resp: 12   Temp: 36.7 C 36.7 C  SpO2: 98%     Last Pain:  Vitals:   01/25/24 0912  TempSrc:   PainSc: 0-No pain                 Prentice Murphy

## 2024-01-25 NOTE — H&P (Signed)
 [facility]   Primary Care Physician:  Bair, Kalpana, MD Ophthalmologist: Dr. Dene Etienne  Pre-Procedure History & Physical: HPI:  Alexandra Bishop is a 78 y.o. female here for ophthalmic surgery.   Past Medical History:  Diagnosis Date   Arachnoid cyst 03/27/2021   Arthritis    Chronic pain of right ankle 11/09/2017   Concern about end of life 09/10/2022   Depression with anxiety 09/06/2018   Episodic tension-type headache, not intractable 10/14/2019   Heel fracture 01/30/2020   Hypertension    Hypothyroidism    Postnasal drip 03/10/2022   Pre-diabetes    Psoriasis     Past Surgical History:  Procedure Laterality Date   BREAST BIOPSY Right    neg   broken ankle - RT     BUNIONECTOMY  2015    Prior to Admission medications   Medication Sig Start Date End Date Taking? Authorizing Provider  amLODipine  (NORVASC ) 10 MG tablet  01/26/21  Yes [provider]  aspirin EC 81 MG tablet Take 81 mg by mouth every other day.   Yes [provider]  atorvastatin  (LIPITOR) 20 MG tablet Take 1 tablet (20 mg total) by mouth daily. 07/22/23  Yes Bair, Kalpana, MD  folic acid  (FOLVITE ) 1 MG tablet Take 1 mg by mouth daily. Take 1 tablet by mouth daily.   Yes [provider]  gabapentin  (NEURONTIN ) 100 MG capsule Take 1 capsule (100 mg total) by mouth 2 (two) times daily. TAKE 1 CAPSULE BY MOUTH TWICE A DAY 11/24/23  Yes Bair, Kalpana, MD  levothyroxine  (SYNTHROID ) 88 MCG tablet Take 1 tablet (88 mcg total) by mouth daily. 11/24/23  Yes Bair, Kalpana, MD  losartan  (COZAAR ) 50 MG tablet Take 1 tablet (50 mg total) by mouth daily. 07/22/23  Yes Bair, Kalpana, MD  Multiple Vitamin (MULTIVITAMIN) tablet Take 1 tablet by mouth every other day.   Yes [provider]  methotrexate (RHEUMATREX) 2.5 MG tablet Take 2.5 mg by mouth once a week. Caution:Chemotherapy. Protect from light.Take 6 tablets by mouth every 7 days all on the same day.    [provider]  nitroGLYCERIN  (NITROGLYN) 2 % ointment Apply small amount to toes daily PRN for Raynaud's symptoms 04/26/22   Silva Juliene SAUNDERS, DPM    Allergies as of 11/03/2023 - Review Complete 07/22/2023  Allergen Reaction Noted   Cymbalta  [duloxetine  hcl]  10/02/2014   Duloxetine   10/02/2014   Hydrocodone  05/13/2011   Shellfish allergy  11/28/2015   Tramadol   05/13/2011    Family History  Problem Relation Age of Onset   Cancer Mother        sarcoma   Heart disease Brother    Cancer Maternal Grandmother        breast   Breast cancer Maternal Grandmother    Diabetes Maternal Grandfather    Cancer Paternal Grandmother        breast   Cancer Other        family hx   Thyroid  disease Other        family hx   Thyroid  cancer Neg Hx     Social History   Socioeconomic History   Marital status: Married    Spouse name: Not on file   Number of children: Not on file   Years of education: Not on file   Highest education level: Not on file  Occupational History   Not on file  Tobacco Use   Smoking status: Never   Smokeless tobacco:  Never   Tobacco comments:    tobacco use - no   Vaping Use   Vaping status: Never Used  Substance and Sexual Activity   Alcohol use: Yes    Comment: rarely   Drug use: Never   Sexual activity: Never  Other Topics Concern   Not on file  Social History Narrative   From [place] originally. Followed daughter to [place] and son in [place] .    Married; retired - office work; gets regular exercise - walks a mile most days. Tai [employer]. Husband and her are following Paleo diet.    Social Drivers of Corporate Investment Banker Strain: Low Risk  (07/06/2023)   Overall Financial Resource Strain (CARDIA)    Difficulty of Paying Living Expenses: Not hard at all  Food Insecurity: No Food Insecurity (07/06/2023)   Hunger Vital Sign    Worried About Running Out of Food in the Last Year: Never true    Ran Out of Food in the Last Year: Never  true  Transportation Needs: No Transportation Needs (07/06/2023)   PRAPARE - Administrator, Civil Service (Medical): No    Lack of Transportation (Non-Medical): No  Physical Activity: Inactive (07/06/2023)   Exercise Vital Sign    Days of Exercise per Week: 0 days    Minutes of Exercise per Session: 0 min  Stress: No Stress Concern Present (07/06/2023)   [employer] of Occupational Health - Occupational Stress Questionnaire    Feeling of Stress : Only a little  Social Connections: Moderately Integrated (07/06/2023)   Social Connection and Isolation Panel    Frequency of Communication with Friends and Family: More than three times a week    Frequency of Social Gatherings with Friends and Family: Once a week    Attends Religious Services: More than 4 times per year    Active Member of [employer] or Organizations: No    Attends Banker Meetings: Never    Marital Status: Married  Catering Manager Violence: Not At Risk (07/06/2023)   Humiliation, Afraid, Rape, and Kick questionnaire    Fear of Current or Ex-Partner: No    Emotionally Abused: No    Physically Abused: No    Sexually Abused: No    Review of Systems: See HPI, otherwise negative ROS  Physical Exam: BP (!) 140/69   Temp 97.8 F (36.6 C) (Temporal)   Resp (!) 21   Ht 5' 7 (1.702 m)   Wt 86.3 kg   SpO2 97%   BMI 29.79 kg/m  General:   Alert,  pleasant and cooperative in NAD Head:  Normocephalic and atraumatic. Lungs:  Clear to auscultation.    Heart:  Regular rate and rhythm.   Impression/Plan: Alexandra Bishop is here for ophthalmic surgery.  Risks, benefits, limitations, and alternatives regarding ophthalmic surgery have been reviewed with the patient.  Questions have been answered.  All parties agreeable.   MITTIE GASKIN, MD  01/25/2024, 8:17 AM

## 2024-01-26 DIAGNOSIS — H2512 Age-related nuclear cataract, left eye: Secondary | ICD-10-CM | POA: Diagnosis not present

## 2024-02-06 ENCOUNTER — Encounter: Payer: Self-pay | Admitting: Ophthalmology

## 2024-02-06 NOTE — Anesthesia Preprocedure Evaluation (Signed)
 Anesthesia Evaluation  Patient identified by MRN, date of birth, ID band Patient awake    Reviewed: Allergy & Precautions, H&P , NPO status , Patient's Chart, lab work & pertinent test results  Airway Mallampati: II  TM Distance: >3 FB Neck ROM: Full    Dental no notable dental hx.    Pulmonary neg pulmonary ROS   Pulmonary exam normal breath sounds clear to auscultation       Cardiovascular hypertension, negative cardio ROS Normal cardiovascular exam Rhythm:Regular Rate:Normal     Neuro/Psych  Headaches PSYCHIATRIC DISORDERS Anxiety Depression    negative neurological ROS  negative psych ROS   GI/Hepatic negative GI ROS, Neg liver ROS,,,  Endo/Other  negative endocrine ROSHypothyroidism    Renal/GU negative Renal ROS  negative genitourinary   Musculoskeletal negative musculoskeletal ROS (+) Arthritis ,    Abdominal   Peds negative pediatric ROS (+)  Hematology negative hematology ROS (+)   Anesthesia Other Findings Previous cataract 01-25-24 Dr. Dario  Medical History  Hypothyroidism Chronic pain of right ankle Heel fracture Episodic tension-type headache, not intractable Arachnoid cyst Depression with anxiety Concern about end of life Postnasal drip Pre-diabetes Arthritis Hypertension Psoriasis Raynaud's disease Psoriasis vulgaris Osteoarthritis     Reproductive/Obstetrics negative OB ROS                              Anesthesia Physical Anesthesia Plan  ASA: 2  Anesthesia Plan: MAC   Post-op Pain Management:    Induction: Intravenous  PONV Risk Score and Plan:   Airway Management Planned: Natural Airway and Nasal Cannula  Additional Equipment:   Intra-op Plan:   Post-operative Plan:   Informed Consent: I have reviewed the patients History and Physical, chart, labs and discussed the procedure including the risks, benefits and alternatives for the proposed  anesthesia with the patient or authorized representative who has indicated his/her understanding and acceptance.     Dental Advisory Given  Plan Discussed with: Anesthesiologist, CRNA and Surgeon  Anesthesia Plan Comments: (Patient consented for risks of anesthesia including but not limited to:  - adverse reactions to medications - damage to eyes, teeth, lips or other oral mucosa - nerve damage due to positioning  - sore throat or hoarseness - Damage to heart, brain, nerves, lungs, other parts of body or loss of life  Patient voiced understanding and assent.)         Anesthesia Quick Evaluation

## 2024-02-07 DIAGNOSIS — Z8659 Personal history of other mental and behavioral disorders: Secondary | ICD-10-CM | POA: Diagnosis not present

## 2024-02-07 DIAGNOSIS — G3184 Mild cognitive impairment, so stated: Secondary | ICD-10-CM | POA: Diagnosis not present

## 2024-02-07 DIAGNOSIS — Z1331 Encounter for screening for depression: Secondary | ICD-10-CM | POA: Diagnosis not present

## 2024-02-07 NOTE — Telephone Encounter (Signed)
 Open in error

## 2024-02-07 NOTE — Discharge Instructions (Signed)

## 2024-02-08 ENCOUNTER — Ambulatory Visit: Payer: Self-pay | Admitting: Anesthesiology

## 2024-02-08 ENCOUNTER — Encounter: Payer: Self-pay | Admitting: Ophthalmology

## 2024-02-08 ENCOUNTER — Encounter
Admission: RE | Disposition: A | Discharge status: Home / Self Care | Payer: Self-pay | Source: Home / Self Care | Attending: Ophthalmology

## 2024-02-08 ENCOUNTER — Ambulatory Visit
Admission: RE | Admit: 2024-02-08 | Discharge: 2024-02-08 | Disposition: A | Discharge status: Home / Self Care | Attending: Ophthalmology | Admitting: Ophthalmology

## 2024-02-08 ENCOUNTER — Other Ambulatory Visit: Payer: Self-pay

## 2024-02-08 DIAGNOSIS — I10 Essential (primary) hypertension: Secondary | ICD-10-CM | POA: Diagnosis not present

## 2024-02-08 DIAGNOSIS — H2512 Age-related nuclear cataract, left eye: Secondary | ICD-10-CM | POA: Insufficient documentation

## 2024-02-08 DIAGNOSIS — F419 Anxiety disorder, unspecified: Secondary | ICD-10-CM | POA: Insufficient documentation

## 2024-02-08 DIAGNOSIS — Z79890 Hormone replacement therapy (postmenopausal): Secondary | ICD-10-CM | POA: Diagnosis not present

## 2024-02-08 DIAGNOSIS — F418 Other specified anxiety disorders: Secondary | ICD-10-CM | POA: Diagnosis not present

## 2024-02-08 DIAGNOSIS — F32A Depression, unspecified: Secondary | ICD-10-CM | POA: Diagnosis not present

## 2024-02-08 DIAGNOSIS — E039 Hypothyroidism, unspecified: Secondary | ICD-10-CM | POA: Insufficient documentation

## 2024-02-08 HISTORY — DX: Psoriasis vulgaris: L40.0

## 2024-02-08 HISTORY — DX: Raynaud's syndrome without gangrene: I73.00

## 2024-02-08 HISTORY — PX: CATARACT EXTRACTION W/PHACO: SHX586

## 2024-02-08 HISTORY — DX: Unspecified osteoarthritis, unspecified site: M19.90

## 2024-02-08 SURGERY — PHACOEMULSIFICATION, CATARACT, WITH IOL INSERTION
Anesthesia: Monitor Anesthesia Care | Site: Eye | Laterality: Left

## 2024-02-08 MED ORDER — SIGHTPATH DOSE#1 NA HYALUR & NA CHOND-NA HYALUR IO KIT
PACK | INTRAOCULAR | Status: DC | PRN
  Administered 2024-02-08: 1 via OPHTHALMIC

## 2024-02-08 MED ORDER — PHENYLEPHRINE HCL 10 % OP SOLN
OPHTHALMIC | Status: AC
  Filled 2024-02-08: qty 5

## 2024-02-08 MED ORDER — PHENYLEPHRINE HCL 10 % OP SOLN
1.0000 [drp] | OPHTHALMIC | Status: AC
Start: 2024-02-08 — End: 2024-02-08
  Administered 2024-02-08 (×3): 1 [drp] via OPHTHALMIC

## 2024-02-08 MED ORDER — MIDAZOLAM HCL 2 MG/2ML IJ SOLN
INTRAMUSCULAR | Status: AC
  Filled 2024-02-08: qty 2

## 2024-02-08 MED ORDER — CYCLOPENTOLATE HCL 2 % OP SOLN
OPHTHALMIC | Status: AC
Start: 2024-02-08 — End: 2024-02-08
  Filled 2024-02-08: qty 2

## 2024-02-08 MED ORDER — MIDAZOLAM HCL (PF) 2 MG/2ML IJ SOLN
INTRAMUSCULAR | Status: DC | PRN
Start: 2024-02-08 — End: 2024-02-08
  Administered 2024-02-08: 1 mg via INTRAVENOUS

## 2024-02-08 MED ORDER — TETRACAINE HCL 0.5 % OP SOLN
1.0000 [drp] | OPHTHALMIC | Status: DC | PRN
  Administered 2024-02-08 (×3): 1 [drp] via OPHTHALMIC

## 2024-02-08 MED ORDER — BRIMONIDINE TARTRATE-TIMOLOL 0.2-0.5 % OP SOLN
OPHTHALMIC | Status: DC | PRN
  Administered 2024-02-08: 1 [drp] via OPHTHALMIC

## 2024-02-08 MED ORDER — PHENYLEPHRINE HCL 10 % OP SOLN
OPHTHALMIC | Status: AC
Start: 2024-02-08 — End: 2024-02-08
  Filled 2024-02-08: qty 5

## 2024-02-08 MED ORDER — LACTATED RINGERS IV SOLN
INTRAVENOUS | Status: DC
Start: 2024-02-08 — End: 2024-02-08

## 2024-02-08 MED ORDER — CEFUROXIME OPHTHALMIC INJECTION 1 MG/0.1 ML
INJECTION | OPHTHALMIC | Status: DC | PRN
Start: 2024-02-08 — End: 2024-02-08
  Administered 2024-02-08: .1 mL via INTRACAMERAL

## 2024-02-08 MED ORDER — TETRACAINE HCL 0.5 % OP SOLN
OPHTHALMIC | Status: AC
  Filled 2024-02-08: qty 4

## 2024-02-08 MED ORDER — CYCLOPENTOLATE HCL 2 % OP SOLN
OPHTHALMIC | Status: AC
  Filled 2024-02-08: qty 2

## 2024-02-08 MED ORDER — SIGHTPATH DOSE#1 BSS IO SOLN
INTRAOCULAR | Status: DC | PRN
Start: 2024-02-08 — End: 2024-02-08
  Administered 2024-02-08: 15 mL via INTRAOCULAR

## 2024-02-08 MED ORDER — FENTANYL CITRATE (PF) 100 MCG/2ML IJ SOLN
INTRAMUSCULAR | Status: DC | PRN
  Administered 2024-02-08: 50 ug via INTRAVENOUS

## 2024-02-08 MED ORDER — FENTANYL CITRATE (PF) 100 MCG/2ML IJ SOLN
INTRAMUSCULAR | Status: AC
  Filled 2024-02-08: qty 2

## 2024-02-08 MED ORDER — SIGHTPATH DOSE#1 BSS IO SOLN
INTRAOCULAR | Status: DC | PRN
  Administered 2024-02-08: 63 mL via OPHTHALMIC

## 2024-02-08 MED ORDER — LIDOCAINE HCL (PF) 2 % IJ SOLN
INTRAOCULAR | Status: DC | PRN
  Administered 2024-02-08: 2 mL

## 2024-02-08 MED ORDER — CYCLOPENTOLATE HCL 2 % OP SOLN
1.0000 [drp] | OPHTHALMIC | Status: AC
  Administered 2024-02-08 (×3): 1 [drp] via OPHTHALMIC

## 2024-02-08 SURGICAL SUPPLY — 8 items
FEE CATARACT SUITE SIGHTPATH (MISCELLANEOUS) ×1 IMPLANT
GLOVE BIOGEL PI IND STRL 8 (GLOVE) ×1 IMPLANT
GLOVE SURG LX STRL 7.5 STRW (GLOVE) ×1 IMPLANT
GLOVE SURG SYN 6.5 PF PI BL (GLOVE) ×1 IMPLANT
LENS IOL TECNIS EYHANCE 13.5 (Intraocular Lens) IMPLANT
NDL FILTER BLUNT 18X1 1/2 (NEEDLE) ×1 IMPLANT
NEEDLE FILTER BLUNT 18X1 1/2 (NEEDLE) ×1 IMPLANT
SYR 3ML LL SCALE MARK (SYRINGE) ×1 IMPLANT

## 2024-02-08 NOTE — Transfer of Care (Signed)
 Immediate Anesthesia Transfer of Care Note  Patient: Alexandra Bishop  Procedure(s) Performed: PHACOEMULSIFICATION, CATARACT, WITH IOL INSERTION 4.48 00:33.9 (Left: Eye)  Patient Location: PACU  Anesthesia Type: MAC  Level of Consciousness: awake, alert  and patient cooperative  Airway and Oxygen Therapy: Patient Spontanous Breathing and Patient connected to supplemental oxygen  Post-op Assessment: Post-op Vital signs reviewed, Patient's Cardiovascular Status Stable, Respiratory Function Stable, Patent Airway and No signs of Nausea or vomiting  Post-op Vital Signs: Reviewed and stable  Complications: No notable events documented.

## 2024-02-08 NOTE — Anesthesia Postprocedure Evaluation (Signed)
 Anesthesia Post Note  Patient: Alexandra Bishop  Procedure(s) Performed: PHACOEMULSIFICATION, CATARACT, WITH IOL INSERTION 4.48 00:33.9 (Left: Eye)  Patient location during evaluation: PACU Anesthesia Type: MAC Level of consciousness: awake and alert Pain management: pain level controlled Vital Signs Assessment: post-procedure vital signs reviewed and stable Respiratory status: spontaneous breathing, nonlabored ventilation, respiratory function stable and patient connected to nasal cannula oxygen Cardiovascular status: stable and blood pressure returned to baseline Postop Assessment: no apparent nausea or vomiting Anesthetic complications: no   No notable events documented.   Last Vitals:  Vitals:   02/08/24 0954 02/08/24 0958  BP: 125/74   Pulse: (!) 52 (!) 53  Resp: 14 11  Temp: (!) 36.2 C (!) 36.2 C  SpO2: 98% 98%    Last Pain:  Vitals:   02/08/24 0958  TempSrc:   PainSc: 0-No pain                 Mckenize Mezera C Labresha Mellor

## 2024-02-08 NOTE — H&P (Signed)
 [facility]   Primary Care Physician:  Bair, Kalpana, MD Ophthalmologist: Dr. Dene Etienne  Pre-Procedure History & Physical: HPI:  Alexandra Bishop is a 78 y.o. female here for ophthalmic surgery.   Past Medical History:  Diagnosis Date   Arachnoid cyst 03/27/2021   Arthritis    Chronic pain of right ankle 11/09/2017   Concern about end of life 09/10/2022   Depression with anxiety 09/06/2018   Episodic tension-type headache, not intractable 10/14/2019   Heel fracture 01/30/2020   Hypertension    Hypothyroidism    Osteoarthritis    Postnasal drip 03/10/2022   Pre-diabetes    Psoriasis    Psoriasis vulgaris    Raynaud's disease     Past Surgical History:  Procedure Laterality Date   BREAST BIOPSY Right    neg   broken ankle - RT     BUNIONECTOMY  2015   CATARACT EXTRACTION W/PHACO Right 01/25/2024   Procedure: PHACOEMULSIFICATION, CATARACT, WITH IOL INSERTION 4.96 00:35.1;  Surgeon: Etienne Dene, MD;  Location: [facility];  Service: Ophthalmology;  Laterality: Right;    Prior to Admission medications   Medication Sig Start Date End Date Taking? Authorizing Provider  amLODipine  (NORVASC ) 10 MG tablet  01/26/21  Yes [provider]  aspirin EC 81 MG tablet Take 81 mg by mouth every other day.   Yes [provider]  atorvastatin  (LIPITOR) 20 MG tablet Take 1 tablet (20 mg total) by mouth daily. 07/22/23  Yes Bair, Kalpana, MD  folic acid  (FOLVITE ) 1 MG tablet Take 1 mg by mouth daily. Take 1 tablet by mouth daily.   Yes [provider]  gabapentin  (NEURONTIN ) 100 MG capsule Take 1 capsule (100 mg total) by mouth 2 (two) times daily. TAKE 1 CAPSULE BY MOUTH TWICE A DAY 11/24/23  Yes Bair, Kalpana, MD  levothyroxine  (SYNTHROID ) 88 MCG tablet Take 1 tablet (88 mcg total) by mouth daily. 11/24/23  Yes Bair, Kalpana, MD  losartan  (COZAAR ) 50 MG tablet Take 1 tablet (50 mg total) by mouth daily. 07/22/23  Yes Bair, Kalpana, MD   Multiple Vitamin (MULTIVITAMIN) tablet Take 1 tablet by mouth every other day.   Yes [provider]  methotrexate (RHEUMATREX) 2.5 MG tablet Take 2.5 mg by mouth once a week. Caution:Chemotherapy. Protect from light.Take 6 tablets by mouth every 7 days all on the same day. Patient not taking: Reported on 01/25/2024    [provider]  nitroGLYCERIN  (NITROGLYN) 2 % ointment Apply small amount to toes daily PRN for Raynaud's symptoms 04/26/22   Silva Juliene SAUNDERS, DPM    Allergies as of 11/08/2023 - Review Complete 07/22/2023  Allergen Reaction Noted   Cymbalta  [duloxetine  hcl]  10/02/2014   Duloxetine   10/02/2014   Hydrocodone  05/13/2011   Shellfish allergy  11/28/2015   Tramadol   05/13/2011    Family History  Problem Relation Age of Onset   Cancer Mother        sarcoma   Heart disease Brother    Cancer Maternal Grandmother        breast   Breast cancer Maternal Grandmother    Diabetes Maternal Grandfather    Cancer Paternal Grandmother        breast   Cancer Other        family hx   Thyroid  disease Other        family hx   Thyroid  cancer Neg Hx     Social History   Socioeconomic History   Marital status:  Married    Spouse name: Not on file   Number of children: Not on file   Years of education: Not on file   Highest education level: Not on file  Occupational History   Not on file  Tobacco Use   Smoking status: Never   Smokeless tobacco: Never   Tobacco comments:    tobacco use - no   Vaping Use   Vaping status: Never Used  Substance and Sexual Activity   Alcohol use: Yes    Comment: rarely   Drug use: Never   Sexual activity: Never  Other Topics Concern   Not on file  Social History Narrative   From [place] originally. Followed daughter to [place] and son in [place] .    Married; retired - office work; gets regular exercise - walks a mile most days. [employer]. Husband and her are following Paleo diet.    Social Drivers of Manufacturing Engineer Strain: Low Risk  (02/07/2024)   Received from [facility]   Overall Financial Resource Strain (CARDIA)    Difficulty of Paying Living Expenses: Not hard at all  Food Insecurity: No Food Insecurity (02/07/2024)   Received from [facility]   Hunger Vital Sign    Within the past 12 months, you worried that your food would run out before you got the money to buy more.: Never true    Within the past 12 months, the food you bought just didn't last and you didn't have money to get more.: Never true  Transportation Needs: No Transportation Needs (02/07/2024)   Received from [facility] - Transportation    In the past 12 months, has lack of transportation kept you from medical appointments or from getting medications?: No    Lack of Transportation (Non-Medical): No  Physical Activity: Inactive (07/06/2023)   Exercise Vital Sign    Days of Exercise per Week: 0 days    Minutes of Exercise per Session: 0 min  Stress: No Stress Concern Present (07/06/2023)   [employer] of Occupational Health - Occupational Stress Questionnaire    Feeling of Stress : Only a little  Social Connections: Moderately Integrated (07/06/2023)   Social Connection and Isolation Panel    Frequency of Communication with Friends and Family: More than three times a week    Frequency of Social Gatherings with Friends and Family: Once a week    Attends Religious Services: More than 4 times per year    Active Member of [employer] or Organizations: No    Attends Banker Meetings: Never    Marital Status: Married  Catering Manager Violence: Not At Risk (07/06/2023)   Humiliation, Afraid, Rape, and Kick questionnaire    Fear of Current or Ex-Partner: No    Emotionally Abused: No    Physically Abused: No    Sexually Abused: No    Review of Systems: See HPI, otherwise negative ROS  Physical Exam: BP 135/70   Pulse (!) 57   Temp  98.2 F (36.8 C) (Temporal)   Resp 17   Ht 5' 7 (1.702 m)   Wt 86 kg   SpO2 100%   BMI 29.69 kg/m  General:   Alert,  pleasant and cooperative in NAD Head:  Normocephalic and atraumatic. Lungs:  Clear to auscultation.    Heart:  Regular rate and rhythm.   Impression/Plan: Alexandra Bishop is here for ophthalmic surgery.  Risks, benefits, limitations, and alternatives  regarding ophthalmic surgery have been reviewed with the patient.  Questions have been answered.  All parties agreeable.   MITTIE GASKIN, MD  02/08/2024, 9:13 AM

## 2024-02-08 NOTE — Op Note (Signed)
 OPERATIVE NOTE  Alexandra Bishop [id number] 02/08/2024   PREOPERATIVE DIAGNOSIS:  Nuclear sclerotic cataract left eye. H25.12   POSTOPERATIVE DIAGNOSIS:    Nuclear sclerotic cataract left eye.     PROCEDURE:  Phacoemusification with posterior chamber intraocular lens placement of the left eye  Ultrasound time: Procedure(s): PHACOEMULSIFICATION, CATARACT, WITH IOL INSERTION 4.48 00:33.9 (Left)  LENS:   Implant Name Type Inv. Item Serial No. Manufacturer Lot No. LRB No. Used Action  LENS IOL TECNIS EYHANCE 13.5 - [id number] Intraocular Lens LENS IOL TECNIS EYHANCE 13.5 [id number] [employer]  Left 1 Implanted      SURGEON:  Dene FABIENE Etienne, MD   ANESTHESIA:  Topical with tetracaine  drops and 2% Xylocaine  jelly, augmented with 1% preservative-free intracameral lidocaine .    COMPLICATIONS:  None.   DESCRIPTION OF PROCEDURE:  The patient was identified in the holding room and transported to the operating room and placed in the supine position under the operating microscope.  The left eye was identified as the operative eye and it was prepped and draped in the usual sterile ophthalmic fashion.   A 1 millimeter clear-corneal paracentesis was made at the 1:30 position.  0.5 ml of preservative-free 1% lidocaine  was injected into the anterior chamber.  The anterior chamber was filled with Viscoat viscoelastic.  A 2.4 millimeter keratome was used to make a near-clear corneal incision at the 10:30 position.  .  A curvilinear capsulorrhexis was made with a cystotome and capsulorrhexis forceps.  Balanced salt solution was used to hydrodissect and hydrodelineate the nucleus.   Phacoemulsification was then used in stop and chop fashion to remove the lens nucleus and epinucleus.  The remaining cortex was then removed using the irrigation and aspiration handpiece. Provisc was then placed into the capsular bag to distend it for lens placement.  A lens was then injected into the capsular bag.  The  remaining viscoelastic was aspirated.   Wounds were hydrated with balanced salt solution.  The anterior chamber was inflated to a physiologic pressure with balanced salt solution.  No wound leaks were noted. Cefuroxime  0.1 ml of a 10mg /ml solution was injected into the anterior chamber for a dose of 1 mg of intracameral antibiotic at the completion of the case.    Timolol  and Brimonidine  drops were applied to the eye.  The patient was taken to the recovery room in stable condition without complications of anesthesia or surgery.  Alexandra Bishop 02/08/2024, 9:51 AM

## 2024-02-14 ENCOUNTER — Telehealth: Payer: Self-pay

## 2024-02-14 NOTE — Telephone Encounter (Signed)
 Prescription Request  02/14/2024  LOV: 11/24/2023  What is the name of the medication or equipment?  losartan  (COZAAR ) 50 MG tablet [   Have you contacted your pharmacy to request a refill? No   Which pharmacy would you like this sent to?    [id number] [address], [place] - [address] [place] [address] Phone: [phone] Fax: [phone]  Phone: [phone] Fax: [phone]  Patient notified that their request is being sent to the clinical staff for review and that they should receive a response within 2 business days.   Please advise at Mobile [phone] (mobile)

## 2024-02-14 NOTE — Telephone Encounter (Signed)
 Spoke with patient's local pharmacy and was informed by pharmacy technician that they had a refill ready for pick up for the patient. Patient has one refill left on the prescription. Left message for patient to give our office a call back to discuss.  OK for E2C2 to give note if patient calls back. If relayed, please notify the office.

## 2024-03-06 DIAGNOSIS — L400 Psoriasis vulgaris: Secondary | ICD-10-CM | POA: Diagnosis not present

## 2024-03-26 ENCOUNTER — Ambulatory Visit (INDEPENDENT_AMBULATORY_CARE_PROVIDER_SITE_OTHER)

## 2024-03-26 VITALS — BP 118/74 | HR 70 | Temp 97.8°F | Ht 65.0 in | Wt 193.2 lb

## 2024-03-26 DIAGNOSIS — E559 Vitamin D deficiency, unspecified: Secondary | ICD-10-CM | POA: Diagnosis not present

## 2024-03-26 DIAGNOSIS — Z6832 Body mass index (BMI) 32.0-32.9, adult: Secondary | ICD-10-CM | POA: Diagnosis not present

## 2024-03-26 DIAGNOSIS — D751 Secondary polycythemia: Secondary | ICD-10-CM

## 2024-03-26 DIAGNOSIS — E661 Drug-induced obesity: Secondary | ICD-10-CM | POA: Diagnosis not present

## 2024-03-26 DIAGNOSIS — N951 Menopausal and female climacteric states: Secondary | ICD-10-CM

## 2024-03-26 DIAGNOSIS — L409 Psoriasis, unspecified: Secondary | ICD-10-CM | POA: Diagnosis not present

## 2024-03-26 DIAGNOSIS — I7300 Raynaud's syndrome without gangrene: Secondary | ICD-10-CM | POA: Diagnosis not present

## 2024-03-26 DIAGNOSIS — E66811 Obesity, class 1: Secondary | ICD-10-CM

## 2024-03-26 DIAGNOSIS — R7303 Prediabetes: Secondary | ICD-10-CM | POA: Diagnosis not present

## 2024-03-26 DIAGNOSIS — E039 Hypothyroidism, unspecified: Secondary | ICD-10-CM

## 2024-03-26 DIAGNOSIS — R7989 Other specified abnormal findings of blood chemistry: Secondary | ICD-10-CM | POA: Diagnosis not present

## 2024-03-26 DIAGNOSIS — G3184 Mild cognitive impairment, so stated: Secondary | ICD-10-CM

## 2024-03-26 MED ORDER — VITAMIN B-12 1000 MCG PO TABS: 1000.0000 ug | ORAL_TABLET | Freq: Every day | ORAL | 3 refills | Status: AC

## 2024-03-26 NOTE — Assessment & Plan Note (Signed)
 Recommend trial of vaginal moisturizer like Replens daily, if not improved recommend follow up visit. Also encouraged patient to discuss this with her dermatologist during upcoming visit.

## 2024-03-26 NOTE — Assessment & Plan Note (Addendum)
 Continue Synthroid  88 mcg daily. Check TSH with her next labs in 4 months.  Orders:   TSH; Future

## 2024-03-26 NOTE — Assessment & Plan Note (Addendum)
 Lifestyle modifications as follows discussed:  Diet: Emphasize whole grains, lean proteins, fruits, and vegetables. Limit processed foods and sugary drinks. Exercise: Aim for 150 minutes of moderate aerobic activity weekly plus strength training twice a week. Weight Loss: With exercise dietary modifications and exercise as tolerated.

## 2024-03-26 NOTE — Assessment & Plan Note (Addendum)
 A1c in prediabetic range, repeat A1c with future lab. Lifestyle modifications as discussed on plan for obesity.  Orders:   Comp Met (CMET); Future   HgB A1c; Future

## 2024-03-26 NOTE — Assessment & Plan Note (Signed)
 Continue follow up with rheumatologist and dermatologist. Currently started on Cosentyx by her dermatologist.

## 2024-03-26 NOTE — Assessment & Plan Note (Addendum)
 Noted on previous labs. Recommend increasing hydration, monitor sleep. Consider referral to sleep specialist for OSA rule out if continues to have elevated hemoglobin and hematocrit. Repeat CBC in 4 months.  Orders:   CBC w/Diff; Future

## 2024-03-26 NOTE — Assessment & Plan Note (Addendum)
 Start B12 1000 mcg daily. Repeat B12 during her follow up lab. Prescription sent.  Orders:   cyanocobalamin  (VITAMIN B12) 1000 MCG tablet; Take 1 tablet (1,000 mcg total) by mouth daily.   B12; Future

## 2024-03-26 NOTE — Progress Notes (Signed)
 "  Established Patient Office Visit   Subjective  Patient ID: Alexandra Bishop, female    DOB: 1946/02/02  Age: 79 y.o. MRN: [id number]  Chief Complaint  Patient presents with   Medical Management of Chronic Issues    Four month follow-up    HPI:   Patient present for follow up visit. Her concerns includes:   Vaginal irritation: She has been noticing vaginal irritation intermittently for the last 6 months. She has been using ketoconazole cream 2% without improvement. No urinary symptoms like dysuria, urinary frequency.   Psoriasis: Failed methotrexate, recently started Cosentyx by her dermatologist.   She has seen neurologist since her last visit with me. No new memory related concerns.   She follows up with Dr. Tobie for Raynaud's phenomenon, taking Amlodipine  10 mg daily. No new concerns.   She reports of trying to eat healthy. Does not like drinking water. Declines snoring.     ROS As per HPI    Objective:     BP 118/74   Pulse 70   Temp 97.8 F (36.6 C)   Ht 5' 5 (1.651 m)   Wt 193 lb 3.2 oz (87.6 kg)   SpO2 96%   BMI 32.15 kg/m      03/26/2024    1:09 PM 11/24/2023    9:34 AM 07/22/2023   10:07 AM  Depression screen PHQ 2/9  Decreased Interest 0 0 0  Down, Depressed, Hopeless 0 0 0  PHQ - 2 Score 0 0 0  Altered sleeping 0 1 0  Tired, decreased energy 0 0 0  Change in appetite 0 0 0  Feeling bad or failure about yourself  0 0 0  Trouble concentrating 0 0 0  Moving slowly or fidgety/restless 0 0 0  Suicidal thoughts 0 0 0  PHQ-9 Score 0 1  0   Difficult doing work/chores Not difficult at all Not difficult at all Not difficult at all     Data saved with a previous flowsheet row definition      03/26/2024    1:09 PM 11/24/2023    9:34 AM 07/22/2023   10:07 AM 03/07/2023   11:18 AM  GAD 7 : Generalized Anxiety Score  Nervous, Anxious, on Edge 0 0 0 0  Control/stop worrying 0 0 0 0  Worry too much - different things 0 0 0 0  Trouble relaxing 0 0 0 0  Restless  0 0 0 0  Easily annoyed or irritable 0 0 0 0  Afraid - awful might happen 0 0 0 0  Total GAD 7 Score 0 0 0 0  Anxiety Difficulty Not difficult at all Not difficult at all Not difficult at all Not difficult at all      03/26/2024    1:09 PM 11/24/2023    9:34 AM 07/22/2023   10:07 AM  Depression screen PHQ 2/9  Decreased Interest 0 0 0  Down, Depressed, Hopeless 0 0 0  PHQ - 2 Score 0 0 0  Altered sleeping 0 1 0  Tired, decreased energy 0 0 0  Change in appetite 0 0 0  Feeling bad or failure about yourself  0 0 0  Trouble concentrating 0 0 0  Moving slowly or fidgety/restless 0 0 0  Suicidal thoughts 0 0 0  PHQ-9 Score 0 1  0   Difficult doing work/chores Not difficult at all Not difficult at all Not difficult at all     Data saved with a previous flowsheet  row definition      03/26/2024    1:09 PM 11/24/2023    9:34 AM 07/22/2023   10:07 AM 03/07/2023   11:18 AM  GAD 7 : Generalized Anxiety Score  Nervous, Anxious, on Edge 0 0 0 0  Control/stop worrying 0 0 0 0  Worry too much - different things 0 0 0 0  Trouble relaxing 0 0 0 0  Restless 0 0 0 0  Easily annoyed or irritable 0 0 0 0  Afraid - awful might happen 0 0 0 0  Total GAD 7 Score 0 0 0 0  Anxiety Difficulty Not difficult at all Not difficult at all Not difficult at all Not difficult at all   SDOH Screenings   Food Insecurity: No Food Insecurity (02/07/2024)   Received from [facility]  Housing: Low Risk  (02/07/2024)   Received from [facility]  Transportation Needs: No Transportation Needs (02/07/2024)   Received from [facility]  Utilities: Not At Risk (02/07/2024)   Received from [facility]  Alcohol Screen: Low Risk (07/06/2023)  Depression (PHQ2-9): Low Risk (03/26/2024)  Financial Resource Strain: Low Risk  (02/07/2024)   Received from [facility]  Physical Activity: Inactive (07/06/2023)  Social Connections: Moderately  Integrated (07/06/2023)  Stress: No Stress Concern Present (07/06/2023)  Tobacco Use: Low Risk (03/26/2024)  Health Literacy: Adequate Health Literacy (07/06/2023)     Physical Exam Constitutional:      General: She is not in acute distress.    Appearance: Normal appearance.  HENT:     Head: Normocephalic and atraumatic.     Mouth/Throat:     Mouth: Mucous membranes are moist.  Neck:     Thyroid : No thyroid  mass or thyroid  tenderness.  Cardiovascular:     Rate and Rhythm: Normal rate and regular rhythm.  Pulmonary:     Effort: Pulmonary effort is normal.     Breath sounds: Normal breath sounds.  Abdominal:     General: Bowel sounds are normal.     Palpations: Abdomen is soft.  Genitourinary:    Comments: Vulvar tissues appear pale and atrophic. No erythema, ulceration, lesions, discharge, bleeding, or excoriations observed Musculoskeletal:     Cervical back: Neck supple. No rigidity.     Right lower leg: No edema.     Left lower leg: No edema.  Skin:    General: Skin is warm.  Neurological:     Mental Status: She is alert and oriented to person, place, and time.  Psychiatric:        Mood and Affect: Mood normal.        Behavior: Behavior normal.        No results found for any visits on 03/26/24.  The ASCVD Risk score (Arnett DK, et al., 2019) failed to calculate for the following reasons:   The 2019 ASCVD risk score is only valid for ages 45 to 51   * - Cholesterol units were assumed     Assessment & Plan:   Assessment & Plan Acquired hypothyroidism Continue Synthroid  88 mcg daily. Check TSH with her next labs in 4 months.  Orders:   TSH; Future  Class 1 drug-induced obesity with serious comorbidity and body mass index (BMI) of 32.0 to 32.9 in adult Lifestyle modifications as follows discussed:  Diet: Emphasize whole grains, lean proteins, fruits, and vegetables. Limit processed foods and sugary drinks. Exercise: Aim for 150 minutes of moderate aerobic  activity weekly plus strength training twice a week. Weight Loss: With exercise dietary modifications and exercise as tolerated.      Low vitamin B12 level Start B12 1000 mcg daily. Repeat B12 during her follow up lab. Prescription sent.  Orders:   cyanocobalamin  (VITAMIN B12) 1000 MCG tablet; Take 1 tablet (1,000 mcg total) by mouth daily.   B12; Future  Polycythemia Noted on previous labs. Recommend increasing hydration, monitor sleep. Consider referral to sleep specialist for OSA rule out if continues to have elevated hemoglobin and hematocrit. Repeat CBC in 4 months.  Orders:   CBC w/Diff; Future  Vitamin D  deficiency Noted on previous labs. Currently taking daily multivitamin. Repeat vitamin D  level.  Orders:   Vitamin D  (25 hydroxy); Future  Vaginal dryness, menopausal Recommend trial of vaginal moisturizer like Replens daily, if not improved recommend follow up visit. Also encouraged patient to discuss this with her dermatologist during upcoming visit.     Prediabetes A1c in prediabetic range, repeat A1c with future lab. Lifestyle modifications as discussed on plan for obesity.  Orders:   Comp Met (CMET); Future   HgB A1c; Future  Psoriasis Continue follow up with rheumatologist and dermatologist. Currently started on Cosentyx by her dermatologist.     Raynaud's disease without gangrene Managed with Amlodipine  10 mg per rheumatologist Dr. Tobie. Asymptomatic. BP within goal. Continue.     Mild cognitive impairment Patient was referred to neurologist and had an appointment with Dr. Lane on 02/07/24 at [facility]: mild cognitive impermanent, short term memory change. 6 M-12 month follow up with him recommended. Stable. Will continue to monitor.        Return in about 4 months (around 07/24/2024) for Chronic follow up , chronic follow up, labs 2 days before appointment.   Luke Shade, MD "

## 2024-03-26 NOTE — Assessment & Plan Note (Addendum)
 Noted on previous labs. Currently taking daily multivitamin. Repeat vitamin D  level.  Orders:   Vitamin D  (25 hydroxy); Future

## 2024-03-26 NOTE — Patient Instructions (Addendum)
 Please start taking vitamin B12 1000 mcg daily. I have sent prescription for this but if your insurance does not cover for it, you can get this over the counter as well.   You can try vaginal lubricating gel like Replens once daily. You can get this over the counter, if symptoms does not improve please schedule a follow up visit with me. Please discuss this with your dermatologist as well.   Follow up in 4 months, we will do labs during your follow up visit.   Homework:  Drink water about 40 oz a day.  Monitor your sleep, ask your husband if you snore at night.

## 2024-03-26 NOTE — Assessment & Plan Note (Signed)
 Managed with Amlodipine  10 mg per rheumatologist Dr. Tobie. Asymptomatic. BP within goal. Continue.

## 2024-03-26 NOTE — Assessment & Plan Note (Signed)
 Patient was referred to neurologist and had an appointment with Dr. Lane on 02/07/24 at [facility]: mild cognitive impermanent, short term memory change. 6 M-12 month follow up with him recommended. Stable. Will continue to monitor.

## 2024-07-09 ENCOUNTER — Ambulatory Visit

## 2024-07-30 ENCOUNTER — Other Ambulatory Visit

## 2024-08-01 ENCOUNTER — Ambulatory Visit
